# Patient Record
Sex: Male | Born: 1950
Health system: Southern US, Community
[De-identification: ages and names within clinical notes are randomized; demographics above are authoritative.]

## PROBLEM LIST (undated history)

## (undated) DIAGNOSIS — I10 Essential (primary) hypertension: Secondary | ICD-10-CM

## (undated) DIAGNOSIS — E785 Hyperlipidemia, unspecified: Secondary | ICD-10-CM

## (undated) DIAGNOSIS — K635 Polyp of colon: Secondary | ICD-10-CM

## (undated) DIAGNOSIS — I2510 Atherosclerotic heart disease of native coronary artery without angina pectoris: Secondary | ICD-10-CM

## (undated) DIAGNOSIS — N529 Male erectile dysfunction, unspecified: Secondary | ICD-10-CM

## (undated) DIAGNOSIS — G4733 Obstructive sleep apnea (adult) (pediatric): Secondary | ICD-10-CM

## (undated) DIAGNOSIS — K219 Gastro-esophageal reflux disease without esophagitis: Secondary | ICD-10-CM

## (undated) HISTORY — DX: Gastro-esophageal reflux disease without esophagitis: K21.9

## (undated) HISTORY — DX: Essential (primary) hypertension: I10

## (undated) HISTORY — DX: Male erectile dysfunction, unspecified: N52.9

## (undated) HISTORY — DX: Obstructive sleep apnea (adult) (pediatric): G47.33

## (undated) HISTORY — PX: CARDIAC CATHETERIZATION: SHX172

## (undated) HISTORY — DX: Hyperlipidemia, unspecified: E78.5

## (undated) HISTORY — DX: Polyp of colon: K63.5

---

## 1976-01-03 HISTORY — PX: OTHER SURGICAL HISTORY: SHX169

## 1996-12-05 ENCOUNTER — Encounter: Payer: Self-pay | Admitting: Internal Medicine

## 2005-10-04 ENCOUNTER — Ambulatory Visit: Payer: Self-pay | Admitting: Internal Medicine

## 2006-02-07 ENCOUNTER — Ambulatory Visit: Payer: Self-pay | Admitting: Internal Medicine

## 2006-02-16 ENCOUNTER — Encounter (INDEPENDENT_AMBULATORY_CARE_PROVIDER_SITE_OTHER): Payer: Self-pay | Admitting: *Deleted

## 2006-02-16 ENCOUNTER — Ambulatory Visit: Payer: Self-pay | Admitting: Internal Medicine

## 2006-09-12 DIAGNOSIS — I10 Essential (primary) hypertension: Secondary | ICD-10-CM | POA: Insufficient documentation

## 2006-09-12 DIAGNOSIS — K219 Gastro-esophageal reflux disease without esophagitis: Secondary | ICD-10-CM | POA: Insufficient documentation

## 2006-09-27 ENCOUNTER — Telehealth (INDEPENDENT_AMBULATORY_CARE_PROVIDER_SITE_OTHER): Payer: Self-pay | Admitting: *Deleted

## 2006-09-27 ENCOUNTER — Telehealth: Payer: Self-pay | Admitting: Internal Medicine

## 2006-09-27 ENCOUNTER — Ambulatory Visit: Payer: Self-pay | Admitting: Internal Medicine

## 2006-09-27 DIAGNOSIS — G479 Sleep disorder, unspecified: Secondary | ICD-10-CM | POA: Insufficient documentation

## 2006-09-28 ENCOUNTER — Encounter (INDEPENDENT_AMBULATORY_CARE_PROVIDER_SITE_OTHER): Payer: Self-pay | Admitting: *Deleted

## 2006-09-28 LAB — CONVERTED CEMR LAB
Calcium: 9 mg/dL (ref 8.4–10.5)
GFR calc Af Amer: 113 mL/min
Glucose, Bld: 86 mg/dL (ref 70–99)
Phosphorus: 3.1 mg/dL (ref 2.3–4.6)
Potassium: 3.9 meq/L (ref 3.5–5.1)
Sodium: 138 meq/L (ref 135–145)

## 2006-10-16 ENCOUNTER — Ambulatory Visit: Payer: Self-pay | Admitting: Pulmonary Disease

## 2006-10-18 ENCOUNTER — Encounter: Payer: Self-pay | Admitting: Pulmonary Disease

## 2006-10-18 ENCOUNTER — Ambulatory Visit (HOSPITAL_BASED_OUTPATIENT_CLINIC_OR_DEPARTMENT_OTHER): Admission: RE | Admit: 2006-10-18 | Discharge: 2006-10-18 | Payer: Self-pay | Admitting: Pulmonary Disease

## 2006-10-23 ENCOUNTER — Ambulatory Visit: Payer: Self-pay | Admitting: Pulmonary Disease

## 2007-01-23 ENCOUNTER — Ambulatory Visit: Payer: Self-pay | Admitting: Internal Medicine

## 2007-05-03 ENCOUNTER — Ambulatory Visit: Payer: Self-pay | Admitting: Internal Medicine

## 2007-05-06 LAB — CONVERTED CEMR LAB
Alkaline Phosphatase: 51 units/L (ref 39–117)
BUN: 10 mg/dL (ref 6–23)
Basophils Absolute: 0 10*3/uL (ref 0.0–0.1)
Bilirubin, Direct: 0.1 mg/dL (ref 0.0–0.3)
CO2: 30 meq/L (ref 19–32)
Chloride: 100 meq/L (ref 96–112)
Creatinine, Ser: 1 mg/dL (ref 0.4–1.5)
Eosinophils Absolute: 0.1 10*3/uL (ref 0.0–0.7)
Lipase: 31 units/L (ref 11.0–59.0)
MCHC: 34.9 g/dL (ref 30.0–36.0)
MCV: 87.5 fL (ref 78.0–100.0)
Neutrophils Relative %: 59.2 % (ref 43.0–77.0)
Platelets: 258 10*3/uL (ref 150–400)
RDW: 11.8 % (ref 11.5–14.6)
Total Bilirubin: 1.8 mg/dL — ABNORMAL HIGH (ref 0.3–1.2)
WBC: 5.4 10*3/uL (ref 4.5–10.5)

## 2007-09-06 ENCOUNTER — Ambulatory Visit: Payer: Self-pay | Admitting: Pulmonary Disease

## 2007-09-06 DIAGNOSIS — G4733 Obstructive sleep apnea (adult) (pediatric): Secondary | ICD-10-CM | POA: Insufficient documentation

## 2007-12-24 ENCOUNTER — Ambulatory Visit: Payer: Self-pay | Admitting: Internal Medicine

## 2007-12-25 LAB — CONVERTED CEMR LAB
AST: 27 units/L (ref 0–37)
Albumin: 3.9 g/dL (ref 3.5–5.2)
Basophils Absolute: 0 10*3/uL (ref 0.0–0.1)
CO2: 29 meq/L (ref 19–32)
Calcium: 8.8 mg/dL (ref 8.4–10.5)
Chloride: 105 meq/L (ref 96–112)
Cholesterol: 166 mg/dL (ref 0–200)
Eosinophils Absolute: 0.1 10*3/uL (ref 0.0–0.7)
GFR calc non Af Amer: 93 mL/min
Glucose, Bld: 115 mg/dL — ABNORMAL HIGH (ref 70–99)
Hemoglobin: 13.5 g/dL (ref 13.0–17.0)
LDL Cholesterol: 123 mg/dL — ABNORMAL HIGH (ref 0–99)
Lymphocytes Relative: 33.2 % (ref 12.0–46.0)
MCHC: 34.8 g/dL (ref 30.0–36.0)
MCV: 87.8 fL (ref 78.0–100.0)
Neutro Abs: 3 10*3/uL (ref 1.4–7.7)
Neutrophils Relative %: 55.1 % (ref 43.0–77.0)
PSA: 0.52 ng/mL (ref 0.10–4.00)
Potassium: 4.1 meq/L (ref 3.5–5.1)
RDW: 11.4 % — ABNORMAL LOW (ref 11.5–14.6)
Sodium: 139 meq/L (ref 135–145)
TSH: 1.85 microintl units/mL (ref 0.35–5.50)
Total Bilirubin: 1 mg/dL (ref 0.3–1.2)
Triglycerides: 74 mg/dL (ref 0–149)
VLDL: 15 mg/dL (ref 0–40)

## 2008-01-21 ENCOUNTER — Telehealth (INDEPENDENT_AMBULATORY_CARE_PROVIDER_SITE_OTHER): Payer: Self-pay | Admitting: *Deleted

## 2008-08-17 ENCOUNTER — Telehealth: Payer: Self-pay | Admitting: Internal Medicine

## 2008-09-14 ENCOUNTER — Ambulatory Visit: Payer: Self-pay | Admitting: Internal Medicine

## 2008-09-15 LAB — CONVERTED CEMR LAB
Chloride: 104 meq/L (ref 96–112)
Glucose, Bld: 93 mg/dL (ref 70–99)
Phosphorus: 3.8 mg/dL (ref 2.3–4.6)
Potassium: 4.5 meq/L (ref 3.5–5.1)
Sodium: 141 meq/L (ref 135–145)

## 2008-12-09 ENCOUNTER — Ambulatory Visit: Payer: Self-pay | Admitting: Internal Medicine

## 2009-01-14 ENCOUNTER — Ambulatory Visit: Payer: Self-pay | Admitting: Internal Medicine

## 2009-01-18 LAB — CONVERTED CEMR LAB
Albumin: 4.2 g/dL (ref 3.5–5.2)
Alkaline Phosphatase: 53 units/L (ref 39–117)
Basophils Relative: 0.3 % (ref 0.0–3.0)
Bilirubin, Direct: 0 mg/dL (ref 0.0–0.3)
Chloride: 99 meq/L (ref 96–112)
Eosinophils Absolute: 0 10*3/uL (ref 0.0–0.7)
GFR calc non Af Amer: 92.63 mL/min (ref >60)
Glucose, Bld: 101 mg/dL — ABNORMAL HIGH (ref 70–99)
HCT: 44.8 % (ref 39.0–52.0)
Hemoglobin: 14.7 g/dL (ref 13.0–17.0)
Lymphocytes Relative: 22.3 % (ref 12.0–46.0)
Lymphs Abs: 1.4 10*3/uL (ref 0.7–4.0)
MCHC: 32.8 g/dL (ref 30.0–36.0)
Monocytes Relative: 7.3 % (ref 3.0–12.0)
Neutro Abs: 4.3 10*3/uL (ref 1.4–7.7)
PSA: 0.6 ng/mL (ref 0.10–4.00)
Phosphorus: 3.8 mg/dL (ref 2.3–4.6)
Potassium: 4.2 meq/L (ref 3.5–5.1)
RBC: 4.89 M/uL (ref 4.22–5.81)
RDW: 11.6 % (ref 11.5–14.6)
Sodium: 135 meq/L (ref 135–145)
Total Protein: 7.3 g/dL (ref 6.0–8.3)

## 2009-01-21 ENCOUNTER — Ambulatory Visit: Payer: Self-pay | Admitting: Family Medicine

## 2009-01-27 ENCOUNTER — Encounter: Payer: Self-pay | Admitting: Family Medicine

## 2009-03-04 ENCOUNTER — Encounter: Payer: Self-pay | Admitting: Family Medicine

## 2009-03-24 ENCOUNTER — Ambulatory Visit: Payer: Self-pay | Admitting: Family Medicine

## 2009-06-28 ENCOUNTER — Ambulatory Visit: Payer: Self-pay | Admitting: Family Medicine

## 2009-08-31 ENCOUNTER — Ambulatory Visit: Payer: Self-pay | Admitting: Internal Medicine

## 2009-08-31 DIAGNOSIS — R0982 Postnasal drip: Secondary | ICD-10-CM | POA: Insufficient documentation

## 2010-02-01 NOTE — Miscellaneous (Signed)
Summary: [facility]  [facility]    Imported By: Lanelle Bal 05/05/2009 11:52:28  _____________________________________________________________________  External Attachment:    Type:   Image     Comment:   External Document

## 2010-02-01 NOTE — Assessment & Plan Note (Signed)
Summary: stomach pains/alc   Vital Signs:  Patient profile:   60 year old male Weight:      190 pounds Temp:     98.2 degrees F oral Pulse rate:   68 / minute Pulse rhythm:   regular BP sitting:   132 / 80  (left arm) Cuff size:   regular  Vitals Entered By: Sydell Axon LPN (June 28, 2009 12:38 PM) CC: Stomach pain, low back pain in the joints when stretching that has been going on for a long time   History of Present Illness: Pt has lower pelvic pain in the suprapubic  area which began after rowing a boat at [employer]. He began having pain a week ago in his lower back into the inguinal area. He had started exercising a few days before doing wistline stretching and lunges The discomforrt he is experiencing is not debillitating and comes and goes. 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 He has had back pain before where he has been sent to chiropracter.   Problems Prior to Update: 1)  Other&unspecified Diseases The Oral Soft Tissues  (ICD-528.9) 2)  Rotator Cuff Syndrome  (ICD-726.10) 3)  Special Screening Malignant Neoplasm of Prostate  (ICD-V76.44) 4)  Obstructive Sleep Apnea  (ICD-327.23) 5)  Abdominal Pain, Suprapubic  (ICD-789.09) 6)  Sleep Disorder  (ICD-780.50) 7)  Preventive Health Care  (ICD-V70.0) 8)  Hypertension  (ICD-401.9) 9)  Gerd  (ICD-530.81) 10)  Colonic Polyps, Hx of  (ICD-V12.72)  Medications Prior to Update: 1)  Amlodipine Besylate 5 Mg Tabs (Amlodipine Besylate) .... Take 1 By Mouth Once Daily  Allergies: 1)  ! Aspirin 2)  Lisinopril-Hydrochlorothiazide (Lisinopril-Hydrochlorothiazide)  Physical Exam  General:  Well-developed,well-nourished,in no acute distress; alert,appropriate and cooperative throughout examination Head:  Normocephalic and atraumatic without obvious abnormalities. No apparent alopecia or balding. Sinuses NT. Eyes:  No corneal or conjunctival inflammation noted. EOMI. Perrla. Funduscopic exam  benign, without hemorrhages, exudates or papilledema. Vision grossly normal. Ears:  R ear normal and L ear normal.   Nose:  External nasal examination shows no deformity or inflammation. Nasal mucosa are pink and moist without lesions or exudates. Mouth:  no erythema, no exudates, and no lesions.   Neck:  supple, no masses, no thyromegaly, no carotid bruits, and no cervical lymphadenopathy.   Lungs:  normal respiratory effort and normal breath sounds.   Heart:  normal rate, regular rhythm, no murmur, and no gallop.   Abdomen:  Bowel sounds positive,abdomen soft and non-tender without masses, organomegaly or hernias noted. Msk:  STomach are NT to palpation, able to do leglift w/o diff or discomfort. Back NT to palpation, ROM reasonably nml. SLR nml.   Impression & Recommendations:  Problem # 1:  MUSCLE STRAIN, ABDOMINAL WALL (ICD-848.8) Assessment New From rowing. Discussed approach. Discussed strateching. Pt not bothered terribly and doesn't want to take medication...discussed heat and ice and avoid prolonged position of sitting or standing...descrided moving if only of the feet while standing, etc.  Problem # 2:  BACK STRAIN, LUMBAR (ICD-847.2) Assessment: New Discussed exercises to do after warmiong up, encouraged stretching. Again heat and ice discussed. Domnot think his problem warrants seeing chropracter.  Complete Medication List: 1)  Amlodipine Besylate 5 Mg Tabs (Amlodipine besylate) .... Take 1 by mouth once daily  Patient Instructions: 1)  RTC if sxs do not resolve.  Current Allergies (reviewed today): ! ASPIRIN LISINOPRIL-HYDROCHLOROTHIAZIDE (LISINOPRIL-HYDROCHLOROTHIAZIDE)

## 2010-02-01 NOTE — Assessment & Plan Note (Signed)
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 Aspirin 2)  Lisinopril-Hydrochlorothiazide (Lisinopril-Hydrochlorothiazide)  Past History:  Past medical, surgical, family and social histories (including risk factors) reviewed for relevance to current acute and chronic problems.  Past Medical History: Reviewed history from 12/24/2007 and no changes required. Colonic polyps, hx of GERD Hypertension Obstructive sleep apnea  Past Surgical History: Reviewed history from 09/12/2006 and no changes required. Hepatitis (prob A) 1978 Poisoning (Asa toxicity), child  Family History: Reviewed history from 09/12/2006 and no changes required. 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 Eyes:  Denies double vision and vision loss-1 eye. ENT:  Denies decreased hearing and ringing in ears; teeth okay--regular with dentist. CV:  Denies chest pain or discomfort, difficulty breathing at night, difficulty breathing while lying down, fainting, lightheadness, near fainting, palpitations, and  shortness of breath with exertion. Resp:  Denies cough and shortness of breath. GI:  Complains of gas and indigestion; denies abdominal pain, bloody stools, change in bowel habits, dark tarry stools, nausea, and vomiting; has used rolaids in past--stopped though Seems that it was simethicone that helped. GU:  Denies erectile dysfunction, nocturia, urinary frequency, and urinary hesitancy. MS:  See HPI; Complains of joint pain and muscle aches; denies joint swelling; some pain in right knee also. Derm:  Denies lesion(s) and rash. Neuro:  Denies headaches, numbness, tremors, and weakness. Psych:  Complains of anxiety; denies depression; occ gets anxious about health but reassured with visits here. Heme:  Denies abnormal bruising and enlarge lymph nodes. Allergy:  Complains of seasonal allergies; denies sneezing; no meds.  Physical Exam  General:  alert and normal appearance.   Eyes:  pupils equal, pupils round, pupils reactive to light, and no optic disk abnormalities.   Ears:  R ear normal and L ear normal.   Mouth:  no erythema, no exudates, and no lesions.   Neck:  supple, no masses, no thyromegaly, no carotid bruits, and no cervical lymphadenopathy.   Lungs:  normal respiratory effort and normal breath sounds.   Heart:  normal rate, regular rhythm, no murmur, and no gallop.   Abdomen:  soft, non-tender, and no masses.   Rectal:  no hemorrhoids and no masses.   Prostate:  no nodules.   ?mild enlargement Msk:  no joint tenderness and no joint  Moderate reduction in abduction, internal and external rotation of right shoulder No ligament or meniscus findings in right knee Pulses:  1+ in feet Extremities:  no edema Neurologic:  alert & oriented X3, strength normal in all extremities, and gait normal.   Skin:  no rashes and no suspicious lesions.   Axillary Nodes:  No palpable lymphadenopathy Inguinal Nodes:  No significant adenopathy Psych:  normally interactive, good eye contact, not  anxious appearing, and not depressed appearing.     Impression & Recommendations:  Problem # 1:  PREVENTIVE HEALTH CARE (ICD-V70.0) Assessment Comment Only will check PSA discussed fitness Tdap  Problem # 2:  HYPERTENSION (ICD-401.9) Assessment: Comment Only  good control no changes needed  His updated medication list for this problem includes:    Amlodipine Besylate 5 Mg Tabs (Amlodipine besylate) .Marland Kitchen... Take 1 by mouth once daily  Orders: TLB-Renal Function Panel (80069-RENAL) TLB-Hepatic/Liver Function Pnl (80076-HEPATIC) TLB-TSH (Thyroid Stimulating Hormone) (84443-TSH) TLB-CBC Platelet - w/Differential (85025-CBCD) Venipuncture (57846)  Problem # 3:  ROTATOR CUFF SYNDROME (ICD-726.10) Assessment: Comment Only clear impingement now will set up eval with Dr Patsy Lager  Complete Medication List: 1)  Amlodipine Besylate 5 Mg Tabs (Amlodipine besylate) .... Take 1 by mouth once daily  Other Orders: TLB-PSA (Prostate Specific Antigen) (84153-PSA) Tdap => 64yrs IM (96295) Admin 1st Vaccine (28413) Admin 1st Vaccine [facility]) [phone])  Patient Instructions: 1)  Please try simethicone for the stomach gas problems. 2)  Please stop sugarless gums and candies 3)  Try tylenol --2 tabs of 325 or 500mg  at bedtime and during day as needed to help shoulder pain 4)  Please schedule a follow-up appointment in 6 months .  5)  Please set up appt with Dr Patsy Lager to evaluate right shoulder  Current Allergies (reviewed today): ! ASPIRIN LISINOPRIL-HYDROCHLOROTHIAZIDE (LISINOPRIL-HYDROCHLOROTHIAZIDE)   Tetanus/Td Vaccine    Vaccine Type: Tdap    Site: left deltoid    Mfr: GlaxoSmithKline    Dose: 0.5 ml    Route: IM    Given by: Mervin Hack CMA (AAMA)    Exp. Date: 02/27/2011    Lot #: [id number]    VIS given: 11/20/06 version given January 14, 2009.

## 2010-02-01 NOTE — Assessment & Plan Note (Signed)
Summary: 6 MONTH FOLLOW UP/[facility]   Vital Signs:  Patient profile:   60 year old male Weight:      187 pounds Temp:     98.6 degrees F oral Pulse rate:   68 / minute Pulse rhythm:   regular BP sitting:   122 / 70  (left arm) Cuff size:   large  Vitals Entered By: Mervin Hack CMA Duncan Dull) (August 31, 2009 11:02 AM) CC: 6 month follow-up   History of Present Illness: shoulder did get better Still doing okay with this  Abd strain is also improved since last visit He changed shoes --he thinks this has helped  Having some chest trouble gets drainage and has to clear throat Has some drainage--not clearly PND Mostly in day No heartburn problems GOing on for 3 weeks Feels a little weak but not overtly sick No fever No cough had some sore throat also----saw doctor at urgent care but didn't take the prescribed amoxil DId try magic mouthwash briefly--no clear improvement Just got new puppy--not clear if it preceeded getting it  No chest pain No SOB--just bothered in his throat No headaches  Allergies: 1)  ! Aspirin 2)  Lisinopril-Hydrochlorothiazide (Lisinopril-Hydrochlorothiazide)  Past History:  Past medical, surgical, family and social histories (including risk factors) reviewed for relevance to current acute and chronic problems.  Past Medical History: Reviewed history from 12/24/2007 and no changes required. Colonic polyps, hx of GERD Hypertension Obstructive sleep apnea  Past Surgical History: Reviewed history from 09/12/2006 and no changes required. Hepatitis (prob A) 1978 Poisoning (Asa toxicity), child  Family History: Reviewed history from 09/12/2006 and no changes required. Mom with arthrits and had shunt for hydrocephalus Dad with HTN Mat GM had MI Mat aunt with DM No cancer  Social History: Reviewed history from 01/14/2009 and no changes required. 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   Head:  no maxillary or frontal tenderness Ears:  R ear normal and L ear normal.   Nose:  miid mostly pale mucosal inflammation Mouth:  no erythema and no exudates.   Neck:  supple, no masses, no thyromegaly, no carotid bruits, and no cervical lymphadenopathy.   Lungs:  normal respiratory effort, no intercostal retractions, no accessory muscle use, and normal breath sounds.   Heart:  normal rate, regular rhythm, no murmur, and no gallop.   Extremities:  no edema Psych:  normally interactive, good eye contact, not anxious appearing, and not depressed appearing.     Impression & Recommendations:  Problem # 1:  POSTNASAL DRIP (ICD-784.91) Assessment New doesn't sound infectious ?allergic  P: will try loratadine    consider taking the antibiotic if worsens  Problem # 2:  BACK STRAIN, LUMBAR (ICD-847.2) Assessment: Improved feels new shoes have helped as well  Problem # 3:  HYPERTENSION (ICD-401.9) Assessment: Unchanged good control no changes needed  His updated medication list for this problem includes:    Amlodipine Besylate 5 Mg Tabs (Amlodipine besylate) .Marland Kitchen... Take 1 by mouth once daily  BP today: 122/70 Prior BP: 132/80 (06/28/2009)  Labs Reviewed: K+: 4.2 (01/14/2009) Creat: : 0.9 (01/14/2009)   Chol: 166 (12/24/2007)   HDL: 27.9 (12/24/2007)   LDL: 123 (12/24/2007)   TG: 74 (12/24/2007)  Complete Medication List: 1)  Amlodipine Besylate 5 Mg Tabs (Amlodipine  besylate) .... Take 1 by mouth once daily  Patient Instructions: 1)  Please try over the counter loratadine 10mg , 1-2 tabs daily to see if that helps the drainage 2)  Please schedule a follow-up appointment in 6 months for physical  Current Allergies (reviewed today): ! ASPIRIN LISINOPRIL-HYDROCHLOROTHIAZIDE (LISINOPRIL-HYDROCHLOROTHIAZIDE)

## 2010-02-01 NOTE — Miscellaneous (Signed)
Summary: PT Status Report/[facility]  PT Status Report/[facility]   Imported By: Lanelle Bal 03/10/2009 09:24:09  _____________________________________________________________________  External Attachment:    Type:   Image     Comment:   External Document

## 2010-02-01 NOTE — Miscellaneous (Signed)
Summary: PT Initial Note/[facility]  PT Initial Note/[facility]   Imported By: Lanelle Bal 02/17/2009 12:59:12  _____________________________________________________________________  External Attachment:    Type:   Image     Comment:   External Document

## 2010-02-01 NOTE — Assessment & Plan Note (Signed)
Summary: 30 MIN APPT EVALUATE RIGHT SHOULDER PER DR LETVAK/[facility]   Vital Signs:  Patient profile:   60 year old male Height:      67 inches Weight:      191.8 pounds BMI:     30.15 Temp:     98.3 degrees F oral Pulse rate:   72 / minute Pulse rhythm:   regular BP sitting:   110 / 70  (left arm) Cuff size:   large  Vitals Entered By: Benny Lennert CMA Duncan Dull) (January 21, 2009 11:25 AM)  History of Present Illness: Chief complaint evaluate right shoulder pain per dr Alphonsus Sias  60 year old very pleasant male seen at the request of Dr. Alphonsus Sias for evaluation of bilateral shoulder pain, R > L.  Approx. 2 months had his arm against the wall, felt a jerk and pain in his right shoulder. Since then, has had some progressively worse R shoulder pain. Lateral shoulder pain, pair with abduction and flexion, pain with IROM.   No swelling or bruising. No history of fracture, dislocation, or surgery.  Slightly better on the R in the past few days.   No PT, HEP, no injections have been tried.   NT at the shoulder blade, no significant neck pain or radiculopathy  Allergies: 1)  ! Aspirin 2)  Lisinopril-Hydrochlorothiazide (Lisinopril-Hydrochlorothiazide)  Past History:  Past medical, surgical, family and social histories (including risk factors) reviewed, and no changes noted (except as noted below).  Past Medical History: Reviewed history from 12/24/2007 and no changes required. Colonic polyps, hx of GERD Hypertension Obstructive sleep apnea  Past Surgical History: Reviewed history from 09/12/2006 and no changes required. Hepatitis (prob A) 1978 Poisoning (Asa toxicity), child   Family History: Reviewed history from 09/12/2006 and no changes required. Mom with arthrits and had shunt for hydrocephalus Dad with HTN Mat GM had MI Mat aunt with DM No cancer  Social History: Reviewed history from 01/14/2009 and no changes required. 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 Otherwise the pertinent positives of the ROS are noted above.    Physical Exam  General:  GEN: Well-developed,well-nourished,in no acute distress; alert,appropriate and cooperative throughout examination HEENT: Normocephalic and atraumatic without obvious abnormalities. No apparent alopecia or balding. Ears, externally no deformities PULM: Breathing comfortably in no respiratory distress EXT: No clubbing, cyanosis, or edema PSYCH: Normally interactive. Cooperative during the interview. Pleasant. Friendly and conversant. Not anxious or depressed appearing. Normal, full affect.  Msk:  Shoulder: Right and Left Inspection: No muscle wasting or winging Ecchymosis/edema: neg  AC joint, scapula, clavicle: NT Cervical spine: NT, full ROM Spurling's: neg Abduction: full, 5/5, some pain with motion Flexion: full, 5/5 IR, full, lift-off: 5/5 ER at neutral: full, 5/5 AC crossover: neg Neer: pos Hawkins: pos Drop Test: neg Empty Can: pos Supraspinatus insertion: mild-mod T Bicipital groove: NT Speed's: pos Yergason's: neg Sulcus sign: neg Scapular dyskinesis: mild scapular dyskinesis C5-T1 intact  Neuro: Sensation intact Grip 5/5    Impression & Recommendations:  Problem # 1:  ROTATOR CUFF SYNDROME (ICD-726.10) XR, shoulder, R Indication: pain Findings: No fracture. No dislocation. Impingement anatomy includes Type III acromium and small distal clavicular spur inferiorly. Moderate AC  joint arthritis. No GH OA.  R and L, R > L impingement pathology, RTC tendinopathy  Shoulder anatomy was reviewed with the patient using and  anatomical model.   Rotator cuff strengthening and scapular stabilization exercises were reviewed with the patient.  A handout was given based on a shoulder program from Dr. Graciella Freer of [facility] and the [facility].  Retraining shoulder mechanics and function was emphasized to the patient with rehab done at least 5-6 days a week.  The patient could benefit from formal PT to assist with scapular stabilization and RTC strengthening, and referral was made  I recommended a R SubAC injection given current pain level, but patient prefers to proceed with PT, HEP, readdress progress prior to injection. ASA allergy  cc: Dr. Alphonsus Sias  Orders: Radiology other (Radiology Other) Physical Therapy Referral (PT)  Complete Medication List: 1)  Amlodipine Besylate 5 Mg Tabs (Amlodipine besylate) .... Take 1 by mouth once daily  Patient Instructions: 1)  Referral Appointment Information 2)  Day/Date: 3)  Time: 4)  Place/MD: 5)  Address: 6)  Phone/Fax: 7)  Patient given appointment information. Information/Orders faxed/mailed.  8)  f/u 6 weeks  Current Allergies (reviewed today): ! ASPIRIN LISINOPRIL-HYDROCHLOROTHIAZIDE (LISINOPRIL-HYDROCHLOROTHIAZIDE)

## 2010-02-01 NOTE — Assessment & Plan Note (Signed)
Summary: 6 WK F/U DLO   Vital Signs:  Patient profile:   60 year old male Height:      67 inches Weight:      190.2 pounds BMI:     29.90 Temp:     98.3 degrees F oral Pulse rate:   72 / minute Pulse rhythm:   regular BP sitting:   114 / 72  (left arm) Cuff size:   regular  Vitals Entered By: Benny Lennert CMA Duncan Dull) (March 24, 2009 10:38 AM)  History of Present Illness: Chief complaint 6 week follow up  60 yo dramatically better RTC, R still a little symptomatic compared to L  ROM better, minimal to no impingement, has been using pully, going to PT, compliant with HEP  REVIEW OF SYSTEMS  GEN: No systemic complaints, no fevers, chills, sweats, or other acute illnesses MSK: Detailed in the HPI GI: tolerating PO intake without difficulty Neuro: No numbness, parasthesias, or tingling associated. Otherwise the pertinent positives of the ROS are noted above.    GEN: Well-developed,well-nourished,in no acute distress; alert,appropriate and cooperative throughout examination HEENT: Normocephalic and atraumatic without obvious abnormalities. No apparent alopecia or balding. Ears, externally no deformities PULM: Breathing comfortably in no respiratory distress EXT: No clubbing, cyanosis, or edema PSYCH: Normally interactive. Cooperative during the interview. Pleasant. Friendly and conversant. Not anxious or depressed appearing. Normal, full affect.   Shoulder: B Inspection: No muscle wasting or winging Ecchymosis/edema: neg  AC joint, scapula, clavicle: NT Cervical spine: NT, full ROM Spurling's: neg Abduction: full, 5/5 Flexion: full, 5/5 IR, full, lift-off: 5/5 ER at neutral: full, 5/5 AC crossover and compression: neg Neer: neg Hawkins: neg Drop Test: neg Empty Can: neg Supraspinatus insertion: NT Bicipital groove: NT Speed's: neg Yergason's: neg Sulcus sign: neg Scapular dyskinesis: none C5-T1 intact Sensation intact Grip 5/5   Allergies: 1)  ! Aspirin 2)   Lisinopril-Hydrochlorothiazide (Lisinopril-Hydrochlorothiazide)  Past History:  Past medical, surgical, family and social histories (including risk factors) reviewed, and no changes noted (except as noted below).  Past Medical History: Reviewed history from 12/24/2007 and no changes required. Colonic polyps, hx of GERD Hypertension Obstructive sleep apnea  Past Surgical History: Reviewed history from 09/12/2006 and no changes required. Hepatitis (prob A) 1978 Poisoning (Asa toxicity), child  Family History: Reviewed history from 09/12/2006 and no changes required. Mom with arthrits and had shunt for hydrocephalus Dad with HTN Mat GM had MI Mat aunt with DM No cancer  Social History: Reviewed history from 01/14/2009 and no changes required. 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 Take 1 by mouth once daily  Current Allergies (reviewed today): ! ASPIRIN LISINOPRIL-HYDROCHLOROTHIAZIDE (LISINOPRIL-HYDROCHLOROTHIAZIDE)

## 2010-02-09 ENCOUNTER — Encounter (INDEPENDENT_AMBULATORY_CARE_PROVIDER_SITE_OTHER): Payer: BC Managed Care – PPO | Admitting: Internal Medicine

## 2010-02-09 ENCOUNTER — Other Ambulatory Visit: Payer: Self-pay | Admitting: Internal Medicine

## 2010-02-09 ENCOUNTER — Encounter: Payer: Self-pay | Admitting: Internal Medicine

## 2010-02-09 ENCOUNTER — Telehealth (INDEPENDENT_AMBULATORY_CARE_PROVIDER_SITE_OTHER): Payer: Self-pay | Admitting: *Deleted

## 2010-02-09 DIAGNOSIS — Z125 Encounter for screening for malignant neoplasm of prostate: Secondary | ICD-10-CM

## 2010-02-09 DIAGNOSIS — Z0000 Encounter for general adult medical examination without abnormal findings: Secondary | ICD-10-CM

## 2010-02-09 DIAGNOSIS — I10 Essential (primary) hypertension: Secondary | ICD-10-CM

## 2010-02-09 LAB — RENAL FUNCTION PANEL
Albumin: 4.3 g/dL (ref 3.5–5.2)
BUN: 13 mg/dL (ref 6–23)
CO2: 30 mEq/L (ref 19–32)
Chloride: 102 mEq/L (ref 96–112)
Creatinine, Ser: 0.8 mg/dL (ref 0.4–1.5)

## 2010-02-09 LAB — HEPATIC FUNCTION PANEL
ALT: 33 U/L (ref 0–53)
Albumin: 4.3 g/dL (ref 3.5–5.2)
Alkaline Phosphatase: 68 U/L (ref 39–117)
Bilirubin, Direct: 0.2 mg/dL (ref 0.0–0.3)
Total Protein: 7 g/dL (ref 6.0–8.3)

## 2010-02-09 LAB — CBC WITH DIFFERENTIAL/PLATELET
Basophils Absolute: 0 10*3/uL (ref 0.0–0.1)
Eosinophils Absolute: 0.1 10*3/uL (ref 0.0–0.7)
HCT: 43.3 % (ref 39.0–52.0)
Hemoglobin: 14.9 g/dL (ref 13.0–17.0)
Lymphs Abs: 1.5 10*3/uL (ref 0.7–4.0)
MCHC: 34.4 g/dL (ref 30.0–36.0)
Monocytes Absolute: 0.5 10*3/uL (ref 0.1–1.0)
Neutro Abs: 3.7 10*3/uL (ref 1.4–7.7)
Platelets: 245 10*3/uL (ref 150.0–400.0)
RDW: 12.6 % (ref 11.5–14.6)

## 2010-02-17 NOTE — Assessment & Plan Note (Signed)
Summary: CPE/JRR   Vital Signs:  Patient profile:   60 year old male Height:      70 inches Weight:      191 pounds BMI:     27.50 Temp:     98.0 degrees F oral Pulse rate:   78 / minute Pulse rhythm:   regular BP sitting:   146 / 76  (left arm) Cuff size:   large  Vitals Entered By: Mervin Hack CMA Duncan Dull) (February 09, 2010 9:38 AM) CC: adult physical   History of Present Illness: Doing okay  Notes poisoning from "[employer]bean" in 4th grade hospitalized Urine was dark and bloody apparently Took aspirin then and was told he was sensitive to it discussed that this is uncertain---would not use as primary prevention, only secondary  Gets itchiness in between left 4th and 5th toes Uses OTC spray and that helps Uses powder now--that helps Discussed just sticking with this  More problems with snoring Having daytime somnolence will pursue the sleep apnea Rx  Gets bilateral low back pain Worse when sitting---esp noticeable after just getting up Better with activity Discussed proper posture and exercise  Gets sharp pain in left trapezius when having stress Will try topical and that helps  Occ forgets things Trouble with some word recall No trouble with minutiae at [employer] will have quick sense of "where am I" in car---?idstracted  Allergies: 1)  Aspirin 2)  Lisinopril-Hydrochlorothiazide (Lisinopril-Hydrochlorothiazide)  Past History:  Past medical, surgical, family and social histories (including risk factors) reviewed for relevance to current acute and chronic problems.  Past Medical History: Reviewed history from 12/24/2007 and no changes required. Colonic polyps, hx of GERD Hypertension Obstructive sleep apnea  Past Surgical History: Reviewed history from 09/12/2006 and no changes required. Hepatitis (prob A) 1978 Poisoning (Asa toxicity), child  Family History: Reviewed history from 09/12/2006 and no changes required. Mom with  arthrits and had shunt for hydrocephalus Dad with HTN Mat GM had MI Mat aunt with DM No cancer  Social History: Reviewed history from 01/14/2009 and no changes required. 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 Eyes:  Denies double vision and vision loss-1 eye. ENT:  Denies decreased hearing and ringing in ears; teeth okay--regular with dentist. CV:  Denies chest pain or discomfort, difficulty breathing at night, difficulty breathing while lying down, fainting, lightheadness, palpitations, and shortness of breath with exertion. Resp:  Denies cough and shortness of breath. GI:  Complains of indigestion; denies abdominal pain, bloody stools, change in bowel habits, dark tarry stools, nausea, and vomiting; rare heartburn. GU:  Denies erectile dysfunction, nocturia, urinary frequency, and urinary hesitancy. MS:  Complains of joint pain and low back pain; denies joint swelling; neck and shoulder pain. Derm:  Denies lesion(s) and rash. Neuro:  Denies headaches, numbness, tingling, and weakness. Psych:  Complains of anxiety; denies depression; Lots of pressure at work No regluar depressed mood. Heme:  Denies abnormal bruising and enlarge lymph nodes. Allergy:  Denies sneezing.  Physical Exam  General:  alert and normal appearance.   Head:  normocephalic and atraumatic.   Eyes:  pupils equal, pupils round, pupils reactive to light, and no optic disk abnormalities.   Ears:  R ear normal and L ear normal.   Mouth:  no erythema,  no exudates, and no lesions.   Neck:  supple, no masses, no thyromegaly, no carotid bruits, and no cervical lymphadenopathy.   Lungs:  normal respiratory effort, no intercostal retractions, no accessory  muscle use, and normal breath sounds.   Heart:  normal rate, regular rhythm, no murmur, and no gallop.   Abdomen:  soft, non-tender, and no masses.   Msk:  no joint tenderness and no joint swelling.   Some tenderness in muscle areas in bilateral low back Pulses:  1+ in feet Extremities:  no edema Neurologic:  alert & oriented X3, strength normal in all extremities, and gait normal.   Skin:  no rashes and no suspicious lesions.   Multiple benign nevi Axillary Nodes:  No palpable lymphadenopathy Psych:  normally interactive, good eye contact, not anxious appearing, and not depressed appearing.     Impression & Recommendations:  Problem # 1:  PREVENTIVE HEALTH CARE (ICD-V70.0) Assessment Comment Only needs to work on stress reduction---exercise, get away from work, etc discussed PSA--will get since he isn't informed about this  Problem # 2:  SLEEP DISORDER (ICD-780.50) Assessment: Deteriorated worse Note sent to Dr clance to set up appt again  Problem # 3:  HYPERTENSION (ICD-401.9) Assessment: Deteriorated  more stress discussed lifestyle issues  His updated medication list for this problem includes:    Amlodipine Besylate 5 Mg Tabs (Amlodipine besylate) .Marland Kitchen... Take 1 by mouth once daily  BP today: 146/76 Prior BP: 122/70 (08/31/2009)  Labs Reviewed: K+: 4.2 (01/14/2009) Creat: : 0.9 (01/14/2009)   Chol: 166 (12/24/2007)   HDL: 27.9 (12/24/2007)   LDL: 123 (12/24/2007)   TG: 74 (12/24/2007)  Orders: Venipuncture (16109) TLB-Renal Function Panel (80069-RENAL) TLB-CBC Platelet - w/Differential (85025-CBCD) TLB-Hepatic/Liver Function Pnl (80076-HEPATIC) TLB-TSH (Thyroid Stimulating Hormone) (84443-TSH)  Complete Medication List: 1)  Amlodipine Besylate 5 Mg Tabs (Amlodipine besylate) .... Take 1 by mouth once daily  Other Orders: TLB-PSA (Prostate Specific Antigen) (84153-PSA)  Patient Instructions: 1)  Please schedule a follow-up appointment in 6 months .     Orders Added: 1)  Venipuncture [36415] 2)  TLB-Renal Function Panel [80069-RENAL] 3)  TLB-CBC Platelet - w/Differential [85025-CBCD] 4)  TLB-Hepatic/Liver Function Pnl [80076-HEPATIC] 5)  TLB-TSH (Thyroid Stimulating Hormone) [84443-TSH] 6)  TLB-PSA (Prostate Specific Antigen) [84153-PSA] 7)  Est. Patient 40-64 years [60454]    Current Allergies (reviewed today): ASPIRIN LISINOPRIL-HYDROCHLOROTHIAZIDE (LISINOPRIL-HYDROCHLOROTHIAZIDE)

## 2010-02-23 NOTE — Progress Notes (Signed)
Summary: need to sched ov with kc  ---- Converted from flag ---- ---- 02/09/2010 10:25 AM, Barbaraann Share MD wrote: Dakota Branch, please call this pt and arrange for an ov with me for treatment of osa per dr Karle Starch request. ------------------------------  called and spoke with pt.  informed him of the above information.  pt stated he was on the other line with someone else and would have to call me back.  gave pt our # to call back to schedule ov. Marland KitchenArman Filter LPN  February 09, 2010 2:28 PM   called and spoke with pt.  pt scheduled to see [facility] 03-16-2010 at 9:45am.  Arman Filter LPN  February 15, 2010 2:22 PM

## 2010-03-16 ENCOUNTER — Ambulatory Visit: Payer: BC Managed Care – PPO | Admitting: Pulmonary Disease

## 2010-03-23 ENCOUNTER — Encounter: Payer: Self-pay | Admitting: Pulmonary Disease

## 2010-03-24 ENCOUNTER — Encounter: Payer: Self-pay | Admitting: Pulmonary Disease

## 2010-03-24 ENCOUNTER — Ambulatory Visit (INDEPENDENT_AMBULATORY_CARE_PROVIDER_SITE_OTHER): Payer: BC Managed Care – PPO | Admitting: Pulmonary Disease

## 2010-03-24 VITALS — BP 110/60 | HR 96 | Temp 98.1°F | Ht 69.0 in | Wt 193.0 lb

## 2010-03-24 DIAGNOSIS — G4733 Obstructive sleep apnea (adult) (pediatric): Secondary | ICD-10-CM

## 2010-03-24 NOTE — Progress Notes (Signed)
  Subjective:    Patient ID: Dakota Branch, male    DOB: May 24, 1950, 60 y.o.   MRN: [id number]  HPI The pt comes in today for f/u of his known mild osa.  He has not been seen since 2009, but is continuing to have symptoms associated with his osa, including sleepiness with driving at times.  He has really forgotten his various options discussed last visit, but he has at least lost 5 pounds.     Review of Systems  Constitutional: Negative for fever, appetite change and unexpected weight change.  HENT: Negative for ear pain, congestion, sore throat, rhinorrhea, sneezing, trouble swallowing, dental problem and postnasal drip.   Eyes: Negative for redness.  Respiratory: Negative for cough, shortness of breath and wheezing.   Cardiovascular: Negative for chest pain, palpitations and leg swelling.  Gastrointestinal: Negative for nausea, abdominal pain and diarrhea.  Genitourinary: Negative for dysuria.  Musculoskeletal: Negative for joint swelling.  Skin: Positive for rash.  Neurological: Negative for syncope and headaches.  Hematological: Does not bruise/bleed easily.  Psychiatric/Behavioral: Negative for dysphoric mood. The patient is not nervous/anxious.        Objective:   Physical Exam  Constitutional: He is oriented to person, place, and time. He appears well-developed and well-nourished.       Mildly sleepy  HENT:       No purulence or discharge noted  Pulmonary/Chest: Breath sounds normal.  Musculoskeletal: He exhibits no edema.  Neurological: He is alert and oriented to person, place, and time.       Mildly sleepy ,moves all 4   Skin:       No cyanosis           Assessment & Plan:

## 2010-03-24 NOTE — Assessment & Plan Note (Signed)
The pt has know mild osa, but is having persistent symptoms since the last visit.  I have reviewed again treatment with surgery, dental appliance, and cpap.  I favor the last 2 initially, and he would tend to agree.  He has decided to try cpap first, and will see how he does with this.

## 2010-03-24 NOTE — Patient Instructions (Signed)
Will start on cpap as a trial.. Please call if having any issues with tolerance followup up with me in 5 weeks.

## 2010-05-17 NOTE — Procedures (Signed)
NAME:  Dakota Branch, UNREIN NO.:  [id number]   MEDICAL RECORD NO.:  [id number]          PATIENT TYPE:  OUT   LOCATION:  SLEEP CENTER                 FACILITY:  [facility]   PHYSICIAN:  Barbaraann Share, MD,FCCPDATE OF BIRTH:  [birth year] 02, 1950   DATE OF STUDY:  10/18/2006                            NOCTURNAL POLYSOMNOGRAM   REFERRING PHYSICIAN:   REFERRING PHYSICIAN:  Marcelyn Bruins, M.D.   INDICATION FOR STUDY:  Hypersomnia with sleep apnea.   EPWORTH SLEEPINESS SCORE:  8   SLEEP ARCHITECTURE:  The patient had a total sleep time of 345 minutes  with adequate slow-wave sleep but decreased REM.  Sleep onset latency  was prolonged at 80 minutes and REM latency was very prolonged as well  at 188 minutes.  Sleep efficiency was decreased at 80%.   RESPIRATORY DATA:  The patient was found to have 68 hypopneas and no  obstructive apneas for an apnea/hypopnea index of 12 events per hour.  The events were not positional but there was loud snoring noted  throughout.   OXYGEN DATA:  There was O2 desaturation as low as 88% with the patient's  obstructive events.   CARDIAC DATA:  No clinically significant arrhythmias were noted.   MOVEMENT/PARASOMNIA:  The patient was found to have only eight leg jerks  with no significant arousal or awakening.  However, he was noted to have  significant bruxism throughout the night.   IMPRESSION/RECOMMENDATION:  1. Mild obstructive sleep apnea/hypopnea syndrome with an      apnea/hypopnea index of 12 events per hour and oxygen desaturation      as low as 88%.  Treatment for this degree of sleep apnea can      include weight loss alone if      applicable, upper airway surgery, and also CPAP.  Clinical      correlation is suggested.  2. Bruxism was noted throughout the night.      Barbaraann Share, MD,FCCP  Diplomate, American Board of Sleep  Medicine  Electronically Signed     [facility]  D:  10/23/2006 08:01:45  T:  10/23/2006 15:29:38  Job:   [id number]

## 2010-05-17 NOTE — Assessment & Plan Note (Signed)
[facility]                             PULMONARY OFFICE NOTE   MOSES, ODOHERTY                     MRN:          [id number]  DATE:10/16/2006                            DOB:          09-20-1950    HISTORY OF PRESENT ILLNESS:  The patient is a 60 year old gentleman whom  I have been asked to see for possible obstructive sleep apnea.  The  patient states that he has been having significant snoring and also  fatigue in the morning for the last 5 to 6 years.  He does not feel that  it has been getting worse.  His wife has not noticed pauses in his  breathing during sleep.  The patient typically goes to bed at 12:00 and  gets up at 7:30 to start his day.  He does not feel totally rested, but  has a hard time expressing his thoughts because he states he has nothing  to compare it to.  The patient denies any choking arousals during the  night.  The patient works in [employer] and really does not  have time to have sleep pressure or alertness issues with periods of  inactivity.  The patient does, however, feel tired in the evening and  has significant sleep pressure if he tries to read or watch TV at that  time.  The patient denies any frank sleepiness with driving, but does  have occasional sleep pressure if driving distances, in that he is very  tired at the time.  He denies any chronic nasal congestion.  Of note,  his weight is up about 5 pounds over the last few years.   PAST MEDICAL HISTORY:  1. Significant for hypertension.  2. History of allergic rhinitis.   CURRENT MEDICATIONS:  Lisinopril 20/25 one-half daily.   The patient has intolerance to ASPIRIN.   SOCIAL HISTORY:  He is married and has 4 children.  He has a history of  smoking as a teenager.   FAMILY HISTORY:  Remarkable for his father having lung cancer and mother  having arthritis of some type.   REVIEW OF SYSTEMS:  As per the history of present illness.  Also see  patient intake form documented in the chart.   PHYSICAL EXAM:  GENERAL:  He is a mildly overweight male in no acute  distress.  Blood pressure 114/70, pulse 75, temperature 98.4, weight 202 pounds.  He is 5 feet 9 inches tall.  O2 saturation on room air is 97%.  HEENT:  Pupils are equal, round, and reactive to light and  accommodation.  Extraocular muscles are intact.  Nares show deviated  septum to the left.  Oropharynx with moderate elongation of the soft  palate and uvula.  NECK:  Supple without JVD or lymphadenopathy.  There is no palpable  thyromegaly.  CHEST:  Totally clear.  CARDIAC:  Reveals regular rate and rhythm.  No murmurs, rubs, or  gallops.  ABDOMEN:  Soft and nontender with good bowel sounds.  GENITAL, RECTAL, AND BREASTS:  Not done and not indicated.  LOWER EXTREMITIES:  Without edema.  Pulses are  intact distally.  NEUROLOGIC:  He is alert and oriented with no obvious observable motor  defects.   IMPRESSION:  Questionable obstructive sleep apnea.  The patient  certainly has some history that is suspicious for this, but other that  is not.  His wife has been complaining about his snoring and he is  concerned about that, and he also feels that he is not as rested as he  should be.  The patient and I discussed a possible trial period of  weight loss to see if things improve, but both of [place] have come to the  conclusion to go ahead and proceed with nocturnal polysomnography and  see if he indeed has sleep apnea.   PLAN:  1. Schedule for nocturnal polysomnogram.  2. Work on weight loss.  3. The patient will follow up after the above.     Barbaraann Share, MD,FCCP  Electronically Signed    [facility]  DD: 10/25/2006  DT: 10/26/2006  Job #: [id number]   cc:   Karie Schwalbe, MD

## 2010-06-06 ENCOUNTER — Telehealth: Payer: Self-pay | Admitting: Pulmonary Disease

## 2010-06-06 NOTE — Telephone Encounter (Signed)
[id number]

## 2010-06-06 NOTE — Telephone Encounter (Signed)
I am not aware of the "smart start", and he can discuss with his dme who can then call me if they have something like this.  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

## 2010-06-06 NOTE — Telephone Encounter (Signed)
I spoke with the patient and he states that he has been using his CPAP machine x 3 nights. 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 He states it is a lot of trouble to do that because when he takes off the mask he then has to turn the light on to see to turn the machine off.  He states this makes a lot of noise and disturbs his wife. He says he did some research and there is a machine that has [employer] which he states the machine automatically shuts off when he removes the mask so he does not need to turn the light on and disturb his wife.  Pt did make 5 week follow-up appt. Pt currently uses [facility].  Please advise.Carron Curie, CMA

## 2010-06-07 NOTE — Telephone Encounter (Signed)
Called [phone] to inform Dakota Branch of this but office closed - [facility] tomorrow.

## 2010-06-07 NOTE — Telephone Encounter (Signed)
Ok with me if the pt is willing to pay for it, or if the dme is going to give it to him for free.  I am not going to write any note or sign special paperwork saying this is medically necessary.

## 2010-06-07 NOTE — Telephone Encounter (Signed)
[facility] returning Crystal's call.  [phone]

## 2010-06-07 NOTE — Telephone Encounter (Signed)
Called, spoke with pt.  He was informed of KC's statement. Pt states the machine he is asking for is the "exact same model as the machine I have but it has more options on it."  He states this machine will automatically turn on when he puts it on and will turn off when if takes the mask off.  He states this what the smart start is and it is no different than the current machine other than having more options.    Called [facility], spoke with Victorino Dike.  Per Victorino Dike, she has spoken with pt too. States she does not know of a machine that has this on it.  Also states the machine pt is currently on has no other models available.  Victorino Dike states she will call Will who is the [facility] rep to discuss this with him.  Victorino Dike will call office back for update.  Will await Jennifer's call back.

## 2010-06-07 NOTE — Telephone Encounter (Signed)
Spoke with [employer].  States she spoke with Will.  They found a machine which is the s9 auto set.  States it looks exactly like the one pt has but it's brains are a little different and it's a little higher end.  Can still download from it.  Does have the smart start option.  Victorino Dike would like to know if ok with KC to order this s9 auto set machine for pt.  Pls advise.  Thanks!

## 2010-06-08 NOTE — Telephone Encounter (Signed)
Called, spoke with Victorino Dike.  She is aware of KC's statement.  States this will be free to pt.  Victorino Dike will inform pt he will be getting this machine.

## 2010-07-13 ENCOUNTER — Ambulatory Visit: Payer: BC Managed Care – PPO | Admitting: Pulmonary Disease

## 2010-08-10 ENCOUNTER — Ambulatory Visit (INDEPENDENT_AMBULATORY_CARE_PROVIDER_SITE_OTHER): Payer: BC Managed Care – PPO | Admitting: Internal Medicine

## 2010-08-10 ENCOUNTER — Encounter: Payer: Self-pay | Admitting: Internal Medicine

## 2010-08-10 VITALS — BP 138/80 | HR 80 | Temp 98.5°F | Ht 69.0 in | Wt 193.0 lb

## 2010-08-10 DIAGNOSIS — G4733 Obstructive sleep apnea (adult) (pediatric): Secondary | ICD-10-CM

## 2010-08-10 DIAGNOSIS — I10 Essential (primary) hypertension: Secondary | ICD-10-CM

## 2010-08-10 DIAGNOSIS — L309 Dermatitis, unspecified: Secondary | ICD-10-CM

## 2010-08-10 DIAGNOSIS — L259 Unspecified contact dermatitis, unspecified cause: Secondary | ICD-10-CM

## 2010-08-10 DIAGNOSIS — K219 Gastro-esophageal reflux disease without esophagitis: Secondary | ICD-10-CM

## 2010-08-10 MED ORDER — TRIAMCINOLONE ACETONIDE 0.1 % EX LOTN: TOPICAL_LOTION | Freq: Three times a day (TID) | CUTANEOUS | Status: DC | PRN

## 2010-08-10 NOTE — Progress Notes (Signed)
  Subjective:    Patient ID: Dakota Branch, male    DOB: 16-May-1950, 60 y.o.   MRN: [id number]  HPI Has seen Dr Shelle Iron Now on CPAP-10 with a ramp up feature Getting used to it--awakens with the machine off at times, dry mouth despite humidifier, etc Not sure if he is sleeping better  Has itchy area for a couple of months on upper chest Slight rash Worse with hot water Has tried some baking powder May be related to gold chain--no nickel that he knows of  No chest pain no SOB No regular exercise Occ brief edema if prolonged sitting--like in car  No stomach problems No heartburn Hasn't needed antacids lately  Current Outpatient Prescriptions on File Prior to Visit  Medication Sig Dispense Refill  . amLODipine (NORVASC) 5 MG tablet Take 5 mg by mouth daily.          Allergies  Allergen Reactions  . Aspirin     REACTION: toxic reaction as a child--but this is unclear  . Lisinopril-Hydrochlorothiazide     REACTION: itching    Past Medical History  Diagnosis Date  . Colon polyps   . GERD (gastroesophageal reflux disease)   . Hypertension   . OSA (obstructive sleep apnea)     Past Surgical History  Procedure Date  . Hepatitis ( prob a) 1978    Family History  Problem Relation Age of Onset  . Hydrocephalus Mother     shunt  . Arthritis Mother   . Hypertension Father   . Heart attack Maternal Grandmother   . Diabetes Maternal Aunt     History   Social History  . Marital Status: Married    Spouse Name: N/A    Number of Children: 4  . Years of Education: N/A   Occupational History  . owner     [employer]   Social History Main Topics  . Smoking status: Former Games developer  . Smokeless tobacco: Never Used  . Alcohol Use: No  . Drug Use: Not on file  . Sexually Active: Not on file   Other Topics Concern  . Not on file   Social History Narrative   Pt is a Theme park manager.   Review of Systems Appetite is okay Weight stable No sig mood  problems     Objective:   Physical Exam  Constitutional: He appears well-developed and well-nourished. No distress.  Neck: Normal range of motion. Neck supple. No thyromegaly present.  Cardiovascular: Normal rate, regular rhythm, normal heart sounds and intact distal pulses.  Exam reveals no gallop.   No murmur heard. Pulmonary/Chest: Effort normal and breath sounds normal. No respiratory distress. He has no wheezes. He has no rales.  Abdominal: Soft. There is no tenderness.  Musculoskeletal: Normal range of motion. He exhibits no edema and no tenderness.  Lymphadenopathy:    He has no cervical adenopathy.  Skin:       Erythematous rash in V distribution in upper chest  Psychiatric: He has a normal mood and affect. His behavior is normal. Judgment and thought content normal.          Assessment & Plan:

## 2010-08-10 NOTE — Assessment & Plan Note (Signed)
Working with Dr Shelle Iron

## 2010-08-10 NOTE — Assessment & Plan Note (Signed)
Good control No changes needed BP Readings from Last 3 Encounters:  08/10/10 138/80  03/24/10 110/60  02/09/10 146/76

## 2010-08-10 NOTE — Assessment & Plan Note (Signed)
Improved Hasn't needed meds of late

## 2010-08-10 NOTE — Assessment & Plan Note (Signed)
Non specific ?photosensitivity Will try triamcinolone lotion prn

## 2010-08-11 ENCOUNTER — Encounter: Payer: Self-pay | Admitting: Pulmonary Disease

## 2010-08-11 ENCOUNTER — Ambulatory Visit (INDEPENDENT_AMBULATORY_CARE_PROVIDER_SITE_OTHER): Payer: BC Managed Care – PPO | Admitting: Pulmonary Disease

## 2010-08-11 VITALS — BP 120/68 | HR 67 | Temp 97.9°F | Ht 69.0 in | Wt 195.0 lb

## 2010-08-11 DIAGNOSIS — G4733 Obstructive sleep apnea (adult) (pediatric): Secondary | ICD-10-CM

## 2010-08-11 NOTE — Patient Instructions (Signed)
Will put machine on auto mode to optimize your pressure.  Will call you with results when I receive your download. Increase heat on humidifier, but if mouth dryness persists, may have to consider a full face mask or add chin strap to your nasal pillows. Work on modest weight loss.  followup with me in 6mos.

## 2010-08-11 NOTE — Progress Notes (Signed)
  Subjective:    Patient ID: Dakota Branch, male    DOB: 06-19-1950, 60 y.o.   MRN: [id number]  HPI The patient comes in today for followup of his known mild instructed sleep apnea.  He was last seen in March of this year and started on CPAP, however did not followup at 5 weeks as instructed.  He has been wearing CPAP since that time, but is having difficulties with dry mouth.  He does use nasal pillows, but is unsure if he is keeping his mouth closed.  He is also not completely comfortable with adjusting his heated humidification.  The patient has never had his CPAP pressure optimized since starting therapy.  Download from his machine today shows no significant air leak, and fairly good control of his AHI at the current setting of 10 cm of water.  However, his compliance has been fairly poor, with only 17% of the used days spent at or greater than 4 hrs.  He used the device only 27/52 days from the middle of June until 8/8.     Review of Systems  Constitutional: Negative for fever and unexpected weight change.  HENT: Negative for ear pain, nosebleeds, congestion, sore throat, rhinorrhea, sneezing, trouble swallowing, dental problem, postnasal drip and sinus pressure.   Eyes: Negative for redness and itching.  Respiratory: Negative for cough, chest tightness, shortness of breath and wheezing.   Cardiovascular: Negative for palpitations and leg swelling.  Gastrointestinal: Negative for nausea and vomiting.  Genitourinary: Negative for dysuria.  Musculoskeletal: Negative for joint swelling.  Skin: Positive for rash.  Neurological: Negative for headaches.  Hematological: Does not bruise/bleed easily.  Psychiatric/Behavioral: Negative for dysphoric mood. The patient is not nervous/anxious.        Objective:   Physical Exam Wd male in nad Nares without discharge or purulence, no skin breakdown or pressure necrosis from cpap mask Cor with rrr LE without edema, no cyanosis Alert, oriented,  moves all 4        Assessment & Plan:

## 2010-08-14 ENCOUNTER — Encounter: Payer: Self-pay | Admitting: Pulmonary Disease

## 2010-08-14 NOTE — Assessment & Plan Note (Signed)
The patient has mild obstructive sleep apnea by his past sleep study, but is having issues with CPAP tolerance at this time.  It is unclear whether his mouth dryness is due to mouth opening, or whether he needs to adjust his heated humidification.  He also has not had his pressure optimized at this time.  I would at least like to have him make adjustments to his humidity, and also optimize his pressure prior to giving up on this mode of therapy.  I discussed other treatment options with him, including surgery and dental appliance.  He would like to try the above suggestions first, and we'll see how he does after the changes.

## 2010-09-11 ENCOUNTER — Other Ambulatory Visit: Payer: Self-pay | Admitting: Pulmonary Disease

## 2010-09-11 DIAGNOSIS — G4733 Obstructive sleep apnea (adult) (pediatric): Secondary | ICD-10-CM

## 2010-09-26 ENCOUNTER — Other Ambulatory Visit: Payer: Self-pay | Admitting: Internal Medicine

## 2011-02-03 ENCOUNTER — Encounter: Payer: Self-pay | Admitting: Internal Medicine

## 2011-02-09 ENCOUNTER — Ambulatory Visit: Payer: BC Managed Care – PPO | Admitting: Pulmonary Disease

## 2011-02-22 ENCOUNTER — Ambulatory Visit (INDEPENDENT_AMBULATORY_CARE_PROVIDER_SITE_OTHER): Payer: BC Managed Care – PPO | Admitting: Internal Medicine

## 2011-02-22 ENCOUNTER — Encounter: Payer: Self-pay | Admitting: Internal Medicine

## 2011-02-22 VITALS — BP 138/80 | HR 68 | Temp 97.7°F | Ht 69.0 in | Wt 194.0 lb

## 2011-02-22 DIAGNOSIS — I10 Essential (primary) hypertension: Secondary | ICD-10-CM

## 2011-02-22 DIAGNOSIS — G4733 Obstructive sleep apnea (adult) (pediatric): Secondary | ICD-10-CM

## 2011-02-22 DIAGNOSIS — Z0000 Encounter for general adult medical examination without abnormal findings: Secondary | ICD-10-CM | POA: Insufficient documentation

## 2011-02-22 LAB — CBC WITH DIFFERENTIAL/PLATELET
Basophils Relative: 0.4 % (ref 0.0–3.0)
Eosinophils Absolute: 0.1 10*3/uL (ref 0.0–0.7)
Eosinophils Relative: 2 % (ref 0.0–5.0)
Hemoglobin: 14.6 g/dL (ref 13.0–17.0)
MCHC: 34.4 g/dL (ref 30.0–36.0)
MCV: 87.8 fl (ref 78.0–100.0)
Monocytes Absolute: 0.5 10*3/uL (ref 0.1–1.0)
Neutro Abs: 3.3 10*3/uL (ref 1.4–7.7)
RBC: 4.82 Mil/uL (ref 4.22–5.81)
WBC: 5.6 10*3/uL (ref 4.5–10.5)

## 2011-02-22 LAB — BASIC METABOLIC PANEL
CO2: 29 mEq/L (ref 19–32)
Chloride: 103 mEq/L (ref 96–112)
Potassium: 4.3 mEq/L (ref 3.5–5.1)
Sodium: 140 mEq/L (ref 135–145)

## 2011-02-22 LAB — LDL CHOLESTEROL, DIRECT: Direct LDL: 164.4 mg/dL

## 2011-02-22 LAB — PSA: PSA: 0.49 ng/mL (ref 0.10–4.00)

## 2011-02-22 LAB — LIPID PANEL: Triglycerides: 63 mg/dL (ref 0.0–149.0)

## 2011-02-22 LAB — HEPATIC FUNCTION PANEL
ALT: 31 U/L (ref 0–53)
AST: 27 U/L (ref 0–37)
Bilirubin, Direct: 0.2 mg/dL (ref 0.0–0.3)
Total Protein: 6.6 g/dL (ref 6.0–8.3)

## 2011-02-22 NOTE — Progress Notes (Signed)
Subjective:    Patient ID: Dakota Branch, male    DOB: 1950/08/05, 61 y.o.   MRN: [id number]  HPI Doing okay in general Just got recall for colonoscopy --has been 5 years  Has some tinnitus in left ear Hearing is okay  Using CPAP with nasal plugs Too much leak Trying to arrange for new mask  Bought exercise machine Has started to use a little  Current Outpatient Prescriptions on File Prior to Visit  Medication Sig Dispense Refill  . amLODipine (NORVASC) 5 MG tablet TAKE ONE TABLET BY MOUTH EVERY DAY  30 tablet  11    Allergies  Allergen Reactions  . Aspirin     REACTION: toxic reaction as a child--but this is unclear  . Lisinopril-Hydrochlorothiazide     REACTION: itching    Past Medical History  Diagnosis Date  . Colon polyps   . GERD (gastroesophageal reflux disease)   . Hypertension   . OSA (obstructive sleep apnea)     Past Surgical History  Procedure Date  . Hepatitis ( prob a) 1978    Family History  Problem Relation Age of Onset  . Hydrocephalus Mother     shunt  . Arthritis Mother   . Hypertension Father   . Heart attack Maternal Grandmother   . Diabetes Maternal Aunt     History   Social History  . Marital Status: Married    Spouse Name: N/A    Number of Children: 4  . Years of Education: N/A   Occupational History  . owner     [employer]   Social History Main Topics  . Smoking status: Former Smoker    Types: Cigarettes  . Smokeless tobacco: Never Used   Comment: Never everyday smoker, "bummed" when in college  . Alcohol Use: Yes     rare  . Drug Use: Not on file  . Sexually Active: Not on file   Other Topics Concern  . Not on file   Social History Narrative   Pt is a Theme park manager.   Review of Systems  Constitutional: Negative for fatigue and unexpected weight change.       Wears seat belt  HENT: Positive for congestion, rhinorrhea and tinnitus. Negative for hearing loss and dental problem.        Spring  allergy symptoms---generally doesn't need meds Regular with dentist  Eyes: Negative for visual disturbance.       No diplopia or unilateral vision loss  Respiratory: Negative for cough, chest tightness and shortness of breath.   Cardiovascular: Negative for chest pain, palpitations and leg swelling.  Gastrointestinal: Negative for nausea, vomiting, abdominal pain, constipation and blood in stool.       Occ heartburn--no meds  Genitourinary: Negative for frequency and difficulty urinating.       No sexual problems  Musculoskeletal: Positive for arthralgias. Negative for back pain and joint swelling.       Fleeting scattered joint pains---mild  Skin: Negative for rash.       Some itching on backs of hands  Neurological: Negative for dizziness, syncope, weakness, light-headedness, numbness and headaches.  Hematological: Negative for adenopathy. Does not bruise/bleed easily.  Psychiatric/Behavioral: Positive for sleep disturbance. Negative for dysphoric mood. The patient is not nervous/anxious.        Objective:   Physical Exam  Constitutional: He is oriented to person, place, and time. He appears well-developed and well-nourished. No distress.  HENT:  Head: Normocephalic and atraumatic.  Right Ear: External ear normal.  Left Ear: External ear normal.  Mouth/Throat: Oropharynx is clear and moist. No oropharyngeal exudate.       TMs normal  Eyes: Conjunctivae and EOM are normal. Pupils are equal, round, and reactive to light.  Neck: Normal range of motion. Neck supple. No thyromegaly present.  Cardiovascular: Normal rate, regular rhythm, normal heart sounds and intact distal pulses.  Exam reveals no gallop.   No murmur heard. Pulmonary/Chest: Effort normal and breath sounds normal. No respiratory distress. He has no wheezes. He has no rales.  Abdominal: Soft. He exhibits no mass. There is no tenderness.  Musculoskeletal: Normal range of motion. He exhibits no edema and no tenderness.    Lymphadenopathy:    He has no cervical adenopathy.  Neurological: He is alert and oriented to person, place, and time.  Skin: Skin is warm. No rash noted. No erythema.  Psychiatric: He has a normal mood and affect. His behavior is normal. Judgment and thought content normal.          Assessment & Plan:

## 2011-02-22 NOTE — Assessment & Plan Note (Signed)
Doing well Needs to work on fitness Will check PSA He is due for repeat colon--got recall letter and he will set up

## 2011-02-22 NOTE — Assessment & Plan Note (Signed)
Still sees Dr Shelle Iron Hasn't been using machine--needs to try new mask

## 2011-02-22 NOTE — Assessment & Plan Note (Signed)
BP Readings from Last 3 Encounters:  02/22/11 138/80  08/11/10 120/68  08/10/10 138/80   Doing fine No changes needed Check labs

## 2011-04-05 ENCOUNTER — Encounter: Payer: Self-pay | Admitting: Internal Medicine

## 2011-04-24 ENCOUNTER — Ambulatory Visit (AMBULATORY_SURGERY_CENTER): Payer: BC Managed Care – PPO | Admitting: *Deleted

## 2011-04-24 ENCOUNTER — Encounter: Payer: Self-pay | Admitting: Internal Medicine

## 2011-04-24 VITALS — Ht 69.0 in | Wt 189.9 lb

## 2011-04-24 DIAGNOSIS — Z1211 Encounter for screening for malignant neoplasm of colon: Secondary | ICD-10-CM

## 2011-04-24 MED ORDER — PEG-KCL-NACL-NASULF-NA ASC-C 100 G PO SOLR: 1.0000 | Freq: Once | ORAL | Status: DC

## 2011-05-05 ENCOUNTER — Ambulatory Visit (AMBULATORY_SURGERY_CENTER): Payer: BC Managed Care – PPO | Admitting: Internal Medicine

## 2011-05-05 ENCOUNTER — Encounter: Payer: Self-pay | Admitting: Internal Medicine

## 2011-05-05 VITALS — BP 148/85 | HR 83 | Temp 98.8°F | Resp 21 | Ht 69.0 in | Wt 189.0 lb

## 2011-05-05 DIAGNOSIS — Z1211 Encounter for screening for malignant neoplasm of colon: Secondary | ICD-10-CM

## 2011-05-05 DIAGNOSIS — K648 Other hemorrhoids: Secondary | ICD-10-CM

## 2011-05-05 DIAGNOSIS — K5730 Diverticulosis of large intestine without perforation or abscess without bleeding: Secondary | ICD-10-CM

## 2011-05-05 DIAGNOSIS — Z86010 Personal history of colonic polyps: Secondary | ICD-10-CM

## 2011-05-05 MED ORDER — SODIUM CHLORIDE 0.9 % IV SOLN: 500.0000 mL | INTRAVENOUS | Status: DC

## 2011-05-05 NOTE — Op Note (Signed)
[facility] [address] [place], [place]  [address]  COLONOSCOPY PROCEDURE REPORT  PATIENT:  Dakota Branch, Dakota Branch  MR#:  [id number] BIRTHDATE:  12/23/1950, 60 yrs. old  GENDER:  male ENDOSCOPIST:  Iva Boop, MD, [facility]  PROCEDURE DATE:  05/05/2011 PROCEDURE:  Colonoscopy 62130 ASA CLASS:  Class II INDICATIONS:  surveillance and high-risk screening, history of polyps 6mm ascending hyperplastic polyp 2008 MEDICATIONS:   These medications were titrated to patient response per physician's verbal order, Fentanyl 75 mcg IV, Versed 7 mg IV  DESCRIPTION OF PROCEDURE:   After the risks benefits and alternatives of the procedure were thoroughly explained, informed consent was obtained.  Digital rectal exam was performed and revealed no abnormalities and normal prostate.   The LB CF-[id number] endoscope was introduced through the anus and advanced to the cecum, which was identified by both the appendix and ileocecal valve, without limitations.  The quality of the prep was excellent, using MoviPrep.  The instrument was then slowly withdrawn as the colon was fully examined. <<PROCEDUREIMAGES>>  FINDINGS:  Mild diverticulosis was found in the sigmoid colon. This was otherwise a normal examination of the colon. Includes right colon retroflexion.   Retroflexed views in the rectum revealed internal hemorrhoids.    The time to cecum = 3:15 minutes. The scope was then withdrawn in 11:33 minutes from the cecum and the procedure completed. COMPLICATIONS:  None ENDOSCOPIC IMPRESSION: 1) Mild diverticulosis in the sigmoid colon 2) Internal hemorrhoids 3) Otherwise normal examination, excellent prep 4) Prior history of 6 mm right-sided hyperplastic polyp  REPEAT EXAM:  In 10 year(s) for routine screening colonoscopy.  Iva Boop, MD, [facility]  CC:  The Patient  n. eSIGNED:   Iva Boop at 05/05/2011 02:28 PM  Cheri Guppy, [phone]

## 2011-05-05 NOTE — Patient Instructions (Signed)
YOU HAD AN ENDOSCOPIC PROCEDURE TODAY AT THE [facility]: Refer to the procedure report that was given to you for any specific questions about what was found during the examination.  If the procedure report does not answer your questions, please call your gastroenterologist to clarify.  If you requested that your care partner not be given the details of your procedure findings, then the procedure report has been included in a sealed envelope for you to review at your convenience later.  YOU SHOULD EXPECT: Some feelings of bloating in the abdomen. Passage of more gas than usual.  Walking can help get rid of the air that was put into your GI tract during the procedure and reduce the bloating. If you had a lower endoscopy (such as a colonoscopy or flexible sigmoidoscopy) you may notice spotting of blood in your stool or on the toilet paper. If you underwent a bowel prep for your procedure, then you may not have a normal bowel movement for a few days.  DIET: Your first meal following the procedure should be a light meal and then it is ok to progress to your normal diet.  A half-sandwich or bowl of soup is an example of a good first meal.  Heavy or fried foods are harder to digest and may make you feel nauseous or bloated.  Likewise meals heavy in dairy and vegetables can cause extra gas to form and this can also increase the bloating.  Drink plenty of fluids but you should avoid alcoholic beverages for 24 hours.  ACTIVITY: Your care partner should take you home directly after the procedure.  You should plan to take it easy, moving slowly for the rest of the day.  You can resume normal activity the day after the procedure however you should NOT DRIVE or use heavy machinery for 24 hours (because of the sedation medicines used during the test).    SYMPTOMS TO REPORT IMMEDIATELY: A gastroenterologist can be reached at any hour.  During normal business hours, 8:30 AM to 5:00 PM Monday through Friday,  call [phone].  After hours and on weekends, please call the GI answering service at [phone] who will take a message and have the physician on call contact you.   Following lower endoscopy (colonoscopy or flexible sigmoidoscopy):  Excessive amounts of blood in the stool  Significant tenderness or worsening of abdominal pains  Swelling of the abdomen that is new, acute  Fever of 100F or higher  Following upper endoscopy (EGD)  Vomiting of blood or coffee ground material  New chest pain or pain under the shoulder blades  Painful or persistently difficult swallowing  New shortness of breath  Fever of 100F or higher  Black, tarry-looking stools  FOLLOW UP: If any biopsies were taken you will be contacted by phone or by letter within the next 1-3 weeks.  Call your gastroenterologist if you have not heard about the biopsies in 3 weeks.  Our staff will call the home number listed on your records the next business day following your procedure to check on you and address any questions or concerns that you may have at that time regarding the information given to you following your procedure. This is a courtesy call and so if there is no answer at the home number and we have not heard from you through the emergency physician on call, we will assume that you have returned to your regular daily activities without incident.  SIGNATURES/CONFIDENTIALITY: You and/or your care   partner have signed paperwork which will be entered into your electronic medical record.  These signatures attest to the fact that that the information above on your After Visit Summary has been reviewed and is understood.  Full responsibility of the confidentiality of this discharge information lies with you and/or your care-partner.  

## 2011-05-08 ENCOUNTER — Telehealth: Payer: Self-pay | Admitting: *Deleted

## 2011-05-08 NOTE — Telephone Encounter (Signed)
  Follow up Call-  Call back number 05/05/2011  Post procedure Call Back phone  # [phone]  Permission to leave phone message Yes     Patient questions:  Do you have a fever, pain , or abdominal swelling? no Pain Score  0 *  Have you tolerated food without any problems? yes  Have you been able to return to your normal activities? yes  Do you have any questions about your discharge instructions: Diet   no Medications  no Follow up visit  no  Do you have questions or concerns about your Care? no  Actions: * If pain score is 4 or above: No action needed, pain <4.

## 2011-05-17 ENCOUNTER — Telehealth: Payer: Self-pay | Admitting: Pulmonary Disease

## 2011-05-17 DIAGNOSIS — G4733 Obstructive sleep apnea (adult) (pediatric): Secondary | ICD-10-CM

## 2011-05-17 NOTE — Telephone Encounter (Signed)
Will send to [facility] to take care of.  Thanks.

## 2011-05-17 NOTE — Telephone Encounter (Signed)
Called spoke with patient who was upset regarding his DME company [facility] for his CPAP and supplies.  Pt stated that he has not received good customer service with this company and has received faulty / inadequate supples > his mask caused his face to break out and itch and broke after two days.  Pt is requesting to change DME companies.  Apologized to patient for his difficulties and advised that will send message to [facility] to okay the change.  Dr Shelle Iron please advise, thanks.  Patient is requesting call back when this is taken care of.

## 2011-05-18 NOTE — Telephone Encounter (Signed)
i need a new order for cpap supplies and change of dme

## 2011-05-18 NOTE — Telephone Encounter (Signed)
Order has been placed. Please advise [facility], thanks 

## 2011-05-19 NOTE — Telephone Encounter (Signed)
Order sent to [facility]. Carron Curie, CMA

## 2011-06-13 ENCOUNTER — Telehealth: Payer: Self-pay | Admitting: Pulmonary Disease

## 2011-06-13 DIAGNOSIS — G4733 Obstructive sleep apnea (adult) (pediatric): Secondary | ICD-10-CM

## 2011-06-13 NOTE — Telephone Encounter (Signed)
Called, spoke with pt who states he was told by [facility] that they could not help him with cpap supplies bc his current machine is a rental through [employer].  He would like this to be fixed.    On 05/18/11 an order was sent to [facility] for:  Needs cpap supplies and change DME from [facility].    [facility]CCs, will you pls help with this?  Should [facility] have supplied pt with a machine when this order was sent?

## 2011-06-13 NOTE — Telephone Encounter (Signed)
Error. New msg coming. Kathleen W Perdue °

## 2011-06-14 NOTE — Telephone Encounter (Signed)
Order placed and sent to [facility]. Shivon Hackel, CMA  

## 2011-06-14 NOTE — Telephone Encounter (Signed)
Need and order to dc cpap through [facility] and a new order to get cpap nad supplies with [facility] thanks.me

## 2011-06-22 ENCOUNTER — Telehealth: Payer: Self-pay | Admitting: Pulmonary Disease

## 2011-06-22 NOTE — Telephone Encounter (Signed)
Called [facility] about pt's equiptment Dakota Branch

## 2011-10-01 ENCOUNTER — Other Ambulatory Visit: Payer: Self-pay | Admitting: Internal Medicine

## 2011-10-20 ENCOUNTER — Ambulatory Visit (INDEPENDENT_AMBULATORY_CARE_PROVIDER_SITE_OTHER): Payer: BC Managed Care – PPO | Admitting: Internal Medicine

## 2011-10-20 ENCOUNTER — Encounter: Payer: Self-pay | Admitting: Internal Medicine

## 2011-10-20 VITALS — BP 138/70 | HR 72 | Temp 98.2°F | Wt 191.0 lb

## 2011-10-20 DIAGNOSIS — M25512 Pain in left shoulder: Secondary | ICD-10-CM

## 2011-10-20 DIAGNOSIS — J029 Acute pharyngitis, unspecified: Secondary | ICD-10-CM

## 2011-10-20 DIAGNOSIS — M25511 Pain in right shoulder: Secondary | ICD-10-CM

## 2011-10-20 DIAGNOSIS — M25519 Pain in unspecified shoulder: Secondary | ICD-10-CM

## 2011-10-20 NOTE — Progress Notes (Signed)
  Subjective:    Patient ID: Dakota Branch, male    DOB: 1950-01-31, 61 y.o.   MRN: [id number]  HPI Has had sore throat for 7-10 days Irritated but no trouble swallowing No cough, nasal congestion, rhinorrhea or fever Tried coricidin--no clear help  Has severe pain in shoulders and hips--esp shoulders Did do some yard [employer] with concrete bricks (he thinks before the soreness started) Tried some lubricant oil on it--not really too helpful  Current Outpatient Prescriptions on File Prior to Visit  Medication Sig Dispense Refill  . amLODipine (NORVASC) 5 MG tablet TAKE ONE TABLET BY MOUTH EVERY DAY  30 tablet  10   Current Facility-Administered Medications on File Prior to Visit  Medication Dose Route Frequency Provider Last Rate Last Dose  . 0.9 %  sodium chloride infusion  500 mL Intravenous Continuous Iva Boop, MD        Allergies  Allergen Reactions  . Aspirin     REACTION: toxic reaction as a child--but this is unclear  . Lisinopril-Hydrochlorothiazide     REACTION: itching    Past Medical History  Diagnosis Date  . Colon polyps   . GERD (gastroesophageal reflux disease)   . Hypertension   . OSA (obstructive sleep apnea)     Past Surgical History  Procedure Date  . Hepatitis ( prob a) 1978    Family History  Problem Relation Age of Onset  . Hydrocephalus Mother     shunt  . Arthritis Mother   . Hypertension Father   . Heart attack Maternal Grandmother   . Diabetes Maternal Aunt   . Colon cancer Neg Hx   . Esophageal cancer Neg Hx   . Rectal cancer Neg Hx   . Stomach cancer Neg Hx     History   Social History  . Marital Status: Married    Spouse Name: N/A    Number of Children: 4  . Years of Education: N/A   Occupational History  . owner     [employer]   Social History Main Topics  . Smoking status: Former Smoker    Types: Cigarettes  . Smokeless tobacco: Never Used   Comment: Never everyday smoker, "bummed" when in college   . Alcohol Use: Yes     rare  . Drug Use: No  . Sexually Active: Not on file   Other Topics Concern  . Not on file   Social History Narrative   Pt is a Theme park manager.   Review of Systems No rash No vomiting or diarrhea Appetite is okay     Objective:   Physical Exam  Constitutional: He appears well-developed and well-nourished. No distress.  HENT:  Head: Normocephalic.  Right Ear: External ear normal.  Left Ear: External ear normal.  Mouth/Throat: No oropharyngeal exudate.       Mild pharyngeal injection TMs normal  Neck: Normal range of motion. Neck supple. No thyromegaly present.  Pulmonary/Chest: Effort normal and breath sounds normal. No respiratory distress. He has no wheezes. He has no rales.  Musculoskeletal:       Normal ROM in shoulders but some pain with external rotation on the right Hips have normal internal rotation bilaterally  Lymphadenopathy:    He has no cervical adenopathy.          Assessment & Plan:

## 2011-10-20 NOTE — Assessment & Plan Note (Signed)
And hips Could be related to concurrent viral infection but I suspect this is due to the lifting of all the concrete bricks he did No weakness noted  Discussed NSAIDs and heat

## 2011-10-20 NOTE — Patient Instructions (Signed)
Try a warm compress or wrap for your shoulder pain. You can try naproxen 220mg  1-2 twice a day, or ibuprofen 200mg  2-3 three times a day--to help with the pain

## 2011-10-20 NOTE — Assessment & Plan Note (Signed)
Really seems viral Should be easing up soon No reason for suspicion of strep---no exudates, nodes, fever Discussed supportive care

## 2012-02-23 ENCOUNTER — Encounter: Payer: Self-pay | Admitting: Internal Medicine

## 2012-02-23 ENCOUNTER — Ambulatory Visit (INDEPENDENT_AMBULATORY_CARE_PROVIDER_SITE_OTHER): Payer: BC Managed Care – PPO | Admitting: Internal Medicine

## 2012-02-23 VITALS — BP 138/80 | HR 77 | Temp 98.1°F | Wt 195.0 lb

## 2012-02-23 DIAGNOSIS — Z0000 Encounter for general adult medical examination without abnormal findings: Secondary | ICD-10-CM

## 2012-02-23 DIAGNOSIS — E785 Hyperlipidemia, unspecified: Secondary | ICD-10-CM

## 2012-02-23 DIAGNOSIS — L570 Actinic keratosis: Secondary | ICD-10-CM

## 2012-02-23 DIAGNOSIS — I10 Essential (primary) hypertension: Secondary | ICD-10-CM

## 2012-02-23 DIAGNOSIS — N529 Male erectile dysfunction, unspecified: Secondary | ICD-10-CM | POA: Insufficient documentation

## 2012-02-23 LAB — BASIC METABOLIC PANEL
BUN: 15 mg/dL (ref 6–23)
CO2: 30 mEq/L (ref 19–32)
Calcium: 9.1 mg/dL (ref 8.4–10.5)
GFR: 99.22 mL/min (ref >60.00)
Glucose, Bld: 108 mg/dL — ABNORMAL HIGH (ref 70–99)
Potassium: 4 mEq/L (ref 3.5–5.1)
Sodium: 138 mEq/L (ref 135–145)

## 2012-02-23 LAB — HEPATIC FUNCTION PANEL
Albumin: 4.2 g/dL (ref 3.5–5.2)
Total Protein: 7.1 g/dL (ref 6.0–8.3)

## 2012-02-23 LAB — CBC WITH DIFFERENTIAL/PLATELET
Basophils Absolute: 0 10*3/uL (ref 0.0–0.1)
Eosinophils Absolute: 0.1 10*3/uL (ref 0.0–0.7)
HCT: 43.9 % (ref 39.0–52.0)
Hemoglobin: 15.1 g/dL (ref 13.0–17.0)
Lymphs Abs: 1.9 10*3/uL (ref 0.7–4.0)
MCHC: 34.5 g/dL (ref 30.0–36.0)
Monocytes Absolute: 0.5 10*3/uL (ref 0.1–1.0)
Monocytes Relative: 9.1 % (ref 3.0–12.0)
Neutro Abs: 3.1 10*3/uL (ref 1.4–7.7)
Platelets: 248 10*3/uL (ref 150.0–400.0)
RDW: 12.4 % (ref 11.5–14.6)

## 2012-02-23 LAB — LIPID PANEL
HDL: 32.3 mg/dL — ABNORMAL LOW (ref >39.00)
Triglycerides: 128 mg/dL (ref 0.0–149.0)

## 2012-02-23 LAB — TSH: TSH: 0.93 u[IU]/mL (ref 0.35–5.50)

## 2012-02-23 LAB — LDL CHOLESTEROL, DIRECT: Direct LDL: 140.1 mg/dL

## 2012-02-23 MED ORDER — TADALAFIL 20 MG PO TABS: 10.0000 mg | ORAL_TABLET | Freq: Every day | ORAL | Status: DC | PRN

## 2012-02-23 MED ORDER — AMLODIPINE BESYLATE 5 MG PO TABS: 5.0000 mg | ORAL_TABLET | Freq: Every day | ORAL | Status: DC

## 2012-02-23 NOTE — Progress Notes (Signed)
Subjective:    Patient ID: Dakota Branch, male    DOB: 1950-03-27, 62 y.o.   MRN: [id number]  HPI Here for physical Shoulders got better but now having some more problems with them at night Has point tenderness close to acromial head No meds for this May have been after he lifted a case of water Does exercise on bicycle at times Discussed that he should try some muscle strengthening  Has noted BP high at times May check after lunch (own machine) 145-150/80's No caffeine  Discussed cholesterol again He is not excited about primary prevention with meds No strong FH of CAD  No current outpatient prescriptions on file prior to visit.   No current facility-administered medications on file prior to visit.    Allergies  Allergen Reactions  . Aspirin     REACTION: toxic reaction as a child--but this is unclear  . Lisinopril-Hydrochlorothiazide     REACTION: itching    Past Medical History  Diagnosis Date  . Colon polyps   . GERD (gastroesophageal reflux disease)   . Hypertension   . OSA (obstructive sleep apnea)     Past Surgical History  Procedure Laterality Date  . Hepatitis ( prob a)  1978    Family History  Problem Relation Age of Onset  . Hydrocephalus Mother     shunt  . Arthritis Mother   . Hypertension Father   . Heart attack Maternal Grandmother   . Diabetes Maternal Aunt   . Colon cancer Neg Hx   . Esophageal cancer Neg Hx   . Rectal cancer Neg Hx   . Stomach cancer Neg Hx     History   Social History  . Marital Status: Married    Spouse Name: N/A    Number of Children: 4  . Years of Education: N/A   Occupational History  . owner     [employer]   Social History Main Topics  . Smoking status: Former Smoker    Types: Cigarettes  . Smokeless tobacco: Never Used     Comment: Never everyday smoker, "bummed" when in college  . Alcohol Use: Yes     Comment: rare  . Drug Use: No  . Sexually Active: Not on file   Other Topics Concern   . Not on file   Social History Narrative   Pt is a Theme park manager.   Review of Systems  Constitutional: Negative for fatigue and unexpected weight change.       Wears seat belt  HENT: Positive for congestion, rhinorrhea and tinnitus. Negative for hearing loss and dental problem.        Mild allergy symptoms---no meds Regular with dentist  Eyes: Negative for visual disturbance.       No diplopia or unilateral vision loss  Respiratory: Negative for cough, chest tightness and shortness of breath.   Cardiovascular: Negative for chest pain, palpitations and leg swelling.  Gastrointestinal: Negative for nausea, vomiting, abdominal pain, constipation and blood in stool.       Some indigestion--no recent antacid use   Endocrine: Negative for cold intolerance and heat intolerance.  Genitourinary: Negative for urgency, frequency and difficulty urinating.       Some ED--interested in meds  Musculoskeletal: Negative for back pain, joint swelling and arthralgias.  Skin: Negative for rash.       No suspicious lesions but has itchy spot along right nose--may be some bigger  Allergic/Immunologic: Positive for environmental allergies. Negative for immunocompromised state.  Neurological: Negative for  dizziness, syncope, weakness, light-headedness, numbness and headaches.  Psychiatric/Behavioral: Negative for sleep disturbance and dysphoric mood. The patient is not nervous/anxious.        Objective:   Physical Exam  Constitutional: He is oriented to person, place, and time. He appears well-developed and well-nourished. No distress.  HENT:  Head: Normocephalic and atraumatic.  Right Ear: External ear normal.  Left Ear: External ear normal.  Mouth/Throat: Oropharynx is clear and moist. No oropharyngeal exudate.  Eyes: Conjunctivae and EOM are normal. Pupils are equal, round, and reactive to light.  Neck: Normal range of motion. Neck supple. No thyromegaly present.  Cardiovascular:  Normal rate, regular rhythm, normal heart sounds and intact distal pulses.  Exam reveals no gallop.   No murmur heard. Pulmonary/Chest: Effort normal and breath sounds normal. No respiratory distress. He has no wheezes. He has no rales.  Abdominal: Soft. There is no tenderness.  Musculoskeletal: He exhibits no edema and no tenderness.  Lymphadenopathy:    He has no cervical adenopathy.  Neurological: He is alert and oriented to person, place, and time.  Skin: No rash noted. No erythema.  Actinic on right side of nose  Psychiatric: He has a normal mood and affect. His behavior is normal.          Assessment & Plan:

## 2012-02-23 NOTE — Assessment & Plan Note (Signed)
Liquid nitrogen 45 seconds x 2 to the lesion on nose Tolerated well Discussed home care

## 2012-02-23 NOTE — Assessment & Plan Note (Signed)
BP Readings from Last 3 Encounters:  02/23/12 138/80  10/20/11 138/70  05/05/11 148/85   Reasonable control Due for labs No change

## 2012-02-23 NOTE — Assessment & Plan Note (Signed)
Discussed primary prevention--will hold off on meds recheck

## 2012-02-23 NOTE — Assessment & Plan Note (Signed)
Fairly healthy Discussed improved fitness UTD on colon Discussed PSA--will defer to every other year

## 2012-02-23 NOTE — Assessment & Plan Note (Signed)
Trial of cialis. ?

## 2012-02-26 ENCOUNTER — Encounter: Payer: Self-pay | Admitting: *Deleted

## 2012-09-16 ENCOUNTER — Telehealth: Payer: Self-pay

## 2012-09-16 NOTE — Telephone Encounter (Signed)
.  left message to have patient return my call.  

## 2012-09-16 NOTE — Telephone Encounter (Signed)
Pt is at [employer] and school requires 3 tetanus injections; pt said he needs tdap otherwise will drop pt from school; pt said only has 2 days to get updated.I looked in paper chart, centricity and epic could only find any injections other than 01/03/1993 td and 01/14/2009 tdap. Pt said he goes to class all day and will be available for injection on 09/17/12. Pt request cb ASAP.

## 2012-09-16 NOTE — Telephone Encounter (Signed)
Please check his full immunization status I would generally not recommend repeating the tetanus so soon, but we can if he never finished a primary series  He would get Td only if he needed another

## 2012-09-17 ENCOUNTER — Telehealth: Payer: Self-pay | Admitting: Family Medicine

## 2012-09-17 NOTE — Telephone Encounter (Signed)
Confidential Office Message [address] [place], [place] [address] p. [phone] f. [phone] To: [facility] (After Hours Triage) Fax: [phone] From: [facility] Date/ Time: 09/16/2012 5:41 PM Taken By: Eduardo Osier, CSR Caller: Conor Facility: home Patient: Dakota Branch, Dakota Branch DOB: 1950/10/12 Phone: [phone] Reason for Call: Pt is student at [employer], left a msg for Dr Karle Starch nurse re: his tetanus shots. He receive msg from the RN saying that he only had to have a shot every 10 yrs but he says the school is requiring that he have records showing that he has had 3 tetanus shots so he needs to come in to office to have one more tetanus shot since he's only had two. He will also call the office back tomorrow. Regarding Appointment: Appt Date: Appt Time: Unknown Provider: Reason: Confidential Details: Outcome:

## 2012-09-17 NOTE — Telephone Encounter (Signed)
Spoke with patient and he will get his records from [employer] and let me know exactly what he needs, if so he will come in for a nurse visit for TD

## 2012-09-17 NOTE — Telephone Encounter (Signed)
Okay to give Td again He has already had Tdap

## 2012-09-22 ENCOUNTER — Ambulatory Visit (INDEPENDENT_AMBULATORY_CARE_PROVIDER_SITE_OTHER): Payer: BC Managed Care – PPO | Admitting: Emergency Medicine

## 2012-09-22 VITALS — BP 118/74 | HR 73 | Temp 98.4°F | Resp 17 | Ht 71.0 in | Wt 188.0 lb

## 2012-09-22 DIAGNOSIS — Z9189 Other specified personal risk factors, not elsewhere classified: Secondary | ICD-10-CM

## 2012-09-22 DIAGNOSIS — Z23 Encounter for immunization: Secondary | ICD-10-CM

## 2012-09-22 DIAGNOSIS — Z2839 Other underimmunization status: Secondary | ICD-10-CM

## 2012-09-22 NOTE — Progress Notes (Signed)
  Subjective:    Patient ID: Dakota Branch, male    DOB: 1950-12-25, 62 y.o.   MRN: [id number]  HPI patient enters  to have his third tetanus shot. His last one was 5 years ago. He needs 3 shots to be at [employer]. 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    Review of Systems     Objective:   Physical Exam  Patient is alert and cooperative in no distress       Assessment & Plan:  The patient was given his third tetanus immunization. This was documented.

## 2012-09-22 NOTE — Progress Notes (Deleted)

## 2012-09-22 NOTE — Addendum Note (Signed)
Addended by: Thelma Barge D on: 09/22/2012 06:35 PM   Modules accepted: Orders

## 2012-11-07 ENCOUNTER — Other Ambulatory Visit: Payer: Self-pay

## 2013-01-07 ENCOUNTER — Ambulatory Visit (INDEPENDENT_AMBULATORY_CARE_PROVIDER_SITE_OTHER): Payer: BC Managed Care – PPO | Admitting: Internal Medicine

## 2013-01-07 ENCOUNTER — Encounter: Payer: Self-pay | Admitting: Internal Medicine

## 2013-01-07 ENCOUNTER — Telehealth: Payer: Self-pay | Admitting: Internal Medicine

## 2013-01-07 VITALS — BP 136/76 | HR 69 | Temp 98.4°F | Wt 193.0 lb

## 2013-01-07 DIAGNOSIS — I10 Essential (primary) hypertension: Secondary | ICD-10-CM

## 2013-01-07 LAB — HM DIABETES EYE EXAM

## 2013-01-07 NOTE — Progress Notes (Signed)
Pre-visit discussion using our clinic review tool. No additional management support is needed unless otherwise documented below in the visit note.  

## 2013-01-07 NOTE — Telephone Encounter (Signed)
See OV note.  

## 2013-01-07 NOTE — Patient Instructions (Signed)
Please check your blood pressure weekly and write them down. Check them when sitting and relaxed. Bring your machine in with you at your next visit

## 2013-01-07 NOTE — Telephone Encounter (Signed)
Pt walked in today BP 168/79; for 2 weeks pt said BP has been elevated; pt has not missed taking medicine. Area between lt neck and shoulder uncomfortable but no pain; slight h/a,blurred vision, pt said sees thunder in eyes ( ? Light) No chest pain,dizziness or SOB. Dr Silvio Pate will see pt today at 2:45pm.

## 2013-01-07 NOTE — Assessment & Plan Note (Signed)
BP Readings from Last 3 Encounters:  01/07/13 136/76  09/22/12 118/74  02/23/12 138/80   High at home but okay here Will have him check and bring in his machine No changes for now

## 2013-01-07 NOTE — Telephone Encounter (Signed)
Patient Information:  Caller Name: George  Phone: [phone]  Patient: Dakota Branch, Dakota Branch  Gender: Male  DOB: [birth year]  Age: 63 Years  PCP: Viviana Simpler [facility])  Office Follow Up:  Does the office need to follow up with this patient?: No  Instructions For The Office: N/A   Symptoms  Reason For Call & Symptoms: 01/07/13 BP today 168/79, seeing a lot of black floaters in eyes, at night seeing bright flashes of light.   Pt states he is at office now and at front desk, no triage done, states he will talk with receptionist to see Dr Silvio Pate at this time  Reviewed Health History In EMR: N/A  Reviewed Medications In EMR: N/A  Reviewed Allergies In EMR: N/A  Reviewed Surgeries / Procedures: N/A  Date of Onset of Symptoms: 01/07/2013  Guideline(s) Used:  No Protocol Available - Sick Adult  Disposition Per Guideline:   N/A  Reason For Disposition Reached:   N/A  Advice Given:  N/A  Patient Will Follow Care Advice:  YES - pt is current AT the office

## 2013-01-07 NOTE — Progress Notes (Signed)
   Subjective:    Patient ID: Dakota Branch, male    DOB: [birth year], 63 y.o.   MRN: [id number]  HPI Having problems with his BP Checked it a couple of weeks ago at home-- 158/80something Has been checking Today 168/79  Eyes feel funny---sees flash in eyes Going to eye doctor later today--- was seen by friend a week ago and going locally now  No headaches  No chest pain No SOB--gets heavy at times  Current Outpatient Prescriptions on File Prior to Visit  Medication Sig Dispense Refill  . amLODipine (NORVASC) 5 MG tablet Take 1 tablet (5 mg total) by mouth daily.  90 tablet  3  . tadalafil (CIALIS) 20 MG tablet Take 0.5-1 tablets (10-20 mg total) by mouth daily as needed for erectile dysfunction.  3 tablet  11   No current facility-administered medications on file prior to visit.    Allergies  Allergen Reactions  . Aspirin     REACTION: toxic reaction as a child--but this is unclear  . Lisinopril-Hydrochlorothiazide     REACTION: itching    Past Medical History  Diagnosis Date  . Colon polyps   . GERD (gastroesophageal reflux disease)   . Hypertension   . OSA (obstructive sleep apnea)   . ED (erectile dysfunction)   . Hyperlipidemia     Past Surgical History  Procedure Laterality Date  . Hepatitis ( prob a)  1978    Family History  Problem Relation Age of Onset  . Hydrocephalus Mother     shunt  . Arthritis Mother   . Hypertension Father   . Heart attack Maternal Grandmother   . Diabetes Maternal Aunt   . Colon cancer Neg Hx   . Esophageal cancer Neg Hx   . Rectal cancer Neg Hx   . Stomach cancer Neg Hx     History   Social History  . Marital Status: Married    Spouse Name: N/A    Number of Children: 4  . Years of Education: N/A   Occupational History  . owner     [employer]   Social History Main Topics  . Smoking status: Former Smoker    Types: Cigarettes  . Smokeless tobacco: Never Used     Comment: Never everyday smoker, "bummed"  when in college  . Alcohol Use: Yes     Comment: rare  . Drug Use: No  . Sexual Activity: Yes    Birth Control/ Protection: Abstinence   Other Topics Concern  . Not on file   Social History Narrative   Pt is a Teacher, early years/pre.   Review of Systems No weakness No swallowing or speech problems Some joint aching     Objective:   Physical Exam  Constitutional: He appears well-developed and well-nourished. No distress.  Neck: Normal range of motion. Neck supple. No thyromegaly present.  Cardiovascular: Normal rate, regular rhythm and normal heart sounds.  Exam reveals no gallop.   No murmur heard. Pulmonary/Chest: Effort normal and breath sounds normal. No respiratory distress. He has no wheezes. He has no rales.  Musculoskeletal: He exhibits no edema.  Lymphadenopathy:    He has no cervical adenopathy.          Assessment & Plan:

## 2013-01-09 ENCOUNTER — Encounter: Payer: Self-pay | Admitting: Internal Medicine

## 2013-02-21 ENCOUNTER — Other Ambulatory Visit: Payer: Self-pay | Admitting: Internal Medicine

## 2013-02-21 LAB — HM DIABETES EYE EXAM

## 2013-02-24 ENCOUNTER — Other Ambulatory Visit: Payer: Self-pay | Admitting: Internal Medicine

## 2013-02-28 ENCOUNTER — Telehealth: Payer: Self-pay | Admitting: Internal Medicine

## 2013-02-28 ENCOUNTER — Encounter: Payer: BC Managed Care – PPO | Admitting: Internal Medicine

## 2013-02-28 NOTE — Telephone Encounter (Signed)
Called pt on 02/27/13 approximately 2:30 pm to cancel appointment on 02/28/13 @ 8:30 am due to inclement weather, office opening at 10 am.  Pt was very insistent that he was coming in at 10 am to see Dr. Silvio Pate and that I had "given" his appointment time to another pt.  I explained that this was a courtesy call and that I was unable to check Dr. Alla German schedule from my home and we would need to either call him back or he could call Fri 02/28/13 at  10 am when we opened, but this did not satisfy.  Pt continued to talk repeating the same concerns, I again stated that I could call back, but could not schedule from my home.  Patient got loud and irate.  I apologized for not being able to help more at that time and discontinued phone call. / lt

## 2013-03-01 NOTE — Telephone Encounter (Signed)
Please call him and see about rescheduling and explain the situation again

## 2013-03-07 ENCOUNTER — Encounter: Payer: Self-pay | Admitting: Internal Medicine

## 2013-03-07 ENCOUNTER — Ambulatory Visit (INDEPENDENT_AMBULATORY_CARE_PROVIDER_SITE_OTHER): Payer: BC Managed Care – PPO | Admitting: Internal Medicine

## 2013-03-07 VITALS — BP 138/80 | HR 62 | Temp 98.3°F | Ht 71.0 in | Wt 188.0 lb

## 2013-03-07 DIAGNOSIS — Z125 Encounter for screening for malignant neoplasm of prostate: Secondary | ICD-10-CM

## 2013-03-07 DIAGNOSIS — E785 Hyperlipidemia, unspecified: Secondary | ICD-10-CM

## 2013-03-07 DIAGNOSIS — I10 Essential (primary) hypertension: Secondary | ICD-10-CM

## 2013-03-07 DIAGNOSIS — N529 Male erectile dysfunction, unspecified: Secondary | ICD-10-CM

## 2013-03-07 DIAGNOSIS — Z0000 Encounter for general adult medical examination without abnormal findings: Secondary | ICD-10-CM

## 2013-03-07 LAB — CBC WITH DIFFERENTIAL/PLATELET
Basophils Absolute: 0 10*3/uL (ref 0.0–0.1)
Basophils Relative: 0.4 % (ref 0.0–3.0)
EOS PCT: 1.6 % (ref 0.0–5.0)
Eosinophils Absolute: 0.1 10*3/uL (ref 0.0–0.7)
HEMATOCRIT: 45 % (ref 39.0–52.0)
Hemoglobin: 15.1 g/dL (ref 13.0–17.0)
LYMPHS ABS: 1.7 10*3/uL (ref 0.7–4.0)
Lymphocytes Relative: 34.2 % (ref 12.0–46.0)
MCHC: 33.6 g/dL (ref 30.0–36.0)
MCV: 88.4 fl (ref 78.0–100.0)
MONOS PCT: 8.5 % (ref 3.0–12.0)
Monocytes Absolute: 0.4 10*3/uL (ref 0.1–1.0)
Neutro Abs: 2.8 10*3/uL (ref 1.4–7.7)
Neutrophils Relative %: 55.3 % (ref 43.0–77.0)
Platelets: 280 10*3/uL (ref 150.0–400.0)
RBC: 5.1 Mil/uL (ref 4.22–5.81)
RDW: 12.6 % (ref 11.5–14.6)
WBC: 5 10*3/uL (ref 4.5–10.5)

## 2013-03-07 LAB — COMPREHENSIVE METABOLIC PANEL
ALT: 26 U/L (ref 0–53)
AST: 26 U/L (ref 0–37)
Albumin: 4.5 g/dL (ref 3.5–5.2)
Alkaline Phosphatase: 58 U/L (ref 39–117)
BUN: 11 mg/dL (ref 6–23)
CO2: 32 meq/L (ref 19–32)
Calcium: 9.5 mg/dL (ref 8.4–10.5)
Chloride: 105 mEq/L (ref 96–112)
Creatinine, Ser: 0.9 mg/dL (ref 0.4–1.5)
GFR: 97.54 mL/min (ref >60.00)
GLUCOSE: 103 mg/dL — AB (ref 70–99)
Potassium: 4.3 mEq/L (ref 3.5–5.1)
Sodium: 141 mEq/L (ref 135–145)
TOTAL PROTEIN: 6.8 g/dL (ref 6.0–8.3)
Total Bilirubin: 1.7 mg/dL — ABNORMAL HIGH (ref 0.3–1.2)

## 2013-03-07 LAB — LIPID PANEL
Cholesterol: 187 mg/dL (ref 0–200)
HDL: 35.3 mg/dL — AB (ref >39.00)
LDL Cholesterol: 142 mg/dL — ABNORMAL HIGH (ref 0–99)
Total CHOL/HDL Ratio: 5
Triglycerides: 50 mg/dL (ref 0.0–149.0)
VLDL: 10 mg/dL (ref 0.0–40.0)

## 2013-03-07 LAB — T4, FREE: FREE T4: 0.85 ng/dL (ref 0.60–1.60)

## 2013-03-07 LAB — PSA: PSA: 0.52 ng/mL (ref 0.10–4.00)

## 2013-03-07 LAB — TSH: TSH: 1.53 u[IU]/mL (ref 0.35–5.50)

## 2013-03-07 MED ORDER — SILDENAFIL CITRATE 20 MG PO TABS: 60.0000 mg | ORAL_TABLET | Freq: Every day | ORAL | Status: DC | PRN

## 2013-03-07 NOTE — Patient Instructions (Signed)
DASH Diet  The DASH diet stands for "Dietary Approaches to Stop Hypertension." It is a healthy eating plan that has been shown to reduce high blood pressure (hypertension) in as little as 14 days, while also possibly providing other significant health benefits. These other health benefits include reducing the risk of breast cancer after menopause and reducing the risk of type 2 diabetes, heart disease, colon cancer, and stroke. Health benefits also include weight loss and slowing kidney failure in patients with chronic kidney disease.   DIET GUIDELINES  · Limit salt (sodium). Your diet should contain less than 1500 mg of sodium daily.  · Limit refined or processed carbohydrates. Your diet should include mostly whole grains. Desserts and added sugars should be used sparingly.  · Include small amounts of heart-healthy fats. These types of fats include nuts, oils, and tub margarine. Limit saturated and trans fats. These fats have been shown to be harmful in the body.  CHOOSING FOODS   The following food groups are based on a 2000 calorie diet. See your Registered Dietitian for individual calorie needs.  Grains and Grain Products (6 to 8 servings daily)  · Eat More Often: Whole-wheat bread, brown rice, whole-grain or wheat pasta, quinoa, popcorn without added fat or salt (air popped).  · Eat Less Often: White bread, white pasta, white rice, cornbread.  Vegetables (4 to 5 servings daily)  · Eat More Often: Fresh, frozen, and canned vegetables. Vegetables may be raw, steamed, roasted, or grilled with a minimal amount of fat.  · Eat Less Often/Avoid: Creamed or fried vegetables. Vegetables in a cheese sauce.  Fruit (4 to 5 servings daily)  · Eat More Often: All fresh, canned (in natural juice), or frozen fruits. Dried fruits without added sugar. One hundred percent fruit juice (½ cup [237 mL] daily).  · Eat Less Often: Dried fruits with added sugar. Canned fruit in light or heavy syrup.  Lean Meats, Fish, and Poultry (2  servings or less daily. One serving is 3 to 4 oz [85-114 g]).  · Eat More Often: Ninety percent or leaner ground beef, tenderloin, sirloin. Round cuts of beef, chicken breast, turkey breast. All fish. Grill, bake, or broil your meat. Nothing should be fried.  · Eat Less Often/Avoid: Fatty cuts of meat, turkey, or chicken leg, thigh, or wing. Fried cuts of meat or fish.  Dairy (2 to 3 servings)  · Eat More Often: Low-fat or fat-free milk, low-fat plain or light yogurt, reduced-fat or part-skim cheese.  · Eat Less Often/Avoid: Milk (whole, 2%). Whole milk yogurt. Full-fat cheeses.  Nuts, Seeds, and Legumes (4 to 5 servings per week)  · Eat More Often: All without added salt.  · Eat Less Often/Avoid: Salted nuts and seeds, canned beans with added salt.  Fats and Sweets (limited)  · Eat More Often: Vegetable oils, tub margarines without trans fats, sugar-free gelatin. Mayonnaise and salad dressings.  · Eat Less Often/Avoid: Coconut oils, palm oils, butter, stick margarine, cream, half and half, cookies, candy, pie.  FOR MORE INFORMATION  The Dash Diet Eating Plan: www.dashdiet.org  Document Released: 12/08/2010 Document Revised: 03/13/2011 Document Reviewed: 12/08/2010  ExitCare® Patient Information ©2014 [employer].

## 2013-03-07 NOTE — Assessment & Plan Note (Signed)
Discussed primary prevention---he doesn't want statin 

## 2013-03-07 NOTE — Progress Notes (Signed)
Subjective:    Patient ID: Dakota Branch, male    DOB: May 05, 1950, 63 y.o.   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 amLODipine (NORVASC) 5 MG tablet TAKE ONE TABLET BY MOUTH ONCE DAILY  90 tablet  0  . tadalafil (CIALIS) 20 MG tablet Take 0.5-1 tablets (10-20 mg total) by mouth daily as needed for erectile dysfunction.  3 tablet  11   No current facility-administered medications on file prior to visit.    Allergies  Allergen Reactions  . Aspirin     REACTION: toxic reaction as a child--but this is unclear  . Lisinopril-Hydrochlorothiazide     REACTION: itching    Past Medical History  Diagnosis Date  . Colon polyps   . GERD (gastroesophageal reflux disease)   . Hypertension   . OSA (obstructive sleep apnea)   . ED (erectile dysfunction)   . Hyperlipidemia     Past Surgical History  Procedure Laterality Date  . Hepatitis ( prob a)  1978    Family History  Problem Relation Age of Onset  . Hydrocephalus Mother     shunt  . Arthritis Mother   . Hypertension Father   . Heart attack Maternal Grandmother   . Diabetes Maternal Aunt   . Colon cancer Neg Hx   . Esophageal cancer Neg Hx   . Rectal cancer Neg Hx   . Stomach cancer Neg Hx     History   Social History  . Marital Status: Married    Spouse Name: N/A    Number of Children: 4  . Years of Education: N/A   Occupational History  . owner     [employer]   Social History Main Topics  . Smoking status: Former Smoker    Types: Cigarettes  . Smokeless tobacco: Never Used     Comment: Never everyday smoker, "bummed" when in college  . Alcohol Use: Yes     Comment: rare  . Drug Use: No  . Sexual  Activity: Yes    Birth Control/ Protection: Abstinence   Other Topics Concern  . Not on file   Social History Narrative   Pt is a Teacher, early years/pre.   Review of Systems  Constitutional: Negative for fatigue and unexpected weight change.       Wears seat belt Fasting for his [employer] faith--this month  HENT: Negative for dental problem, hearing loss and tinnitus.        Regular with dentist  Eyes: Negative for visual disturbance.       No diplopia or unilateral vision loss  Respiratory: Negative for cough, chest tightness and shortness of breath.   Cardiovascular: Negative for chest pain, palpitations and leg swelling.  Gastrointestinal: Negative for nausea, vomiting, abdominal pain, constipation and blood in stool.       Rare heartburn--no meds  Genitourinary: Negative for frequency and difficulty urinating.       No regular nocturia cialis caused muscle pain--rarely uses small amount  Musculoskeletal: Negative for arthralgias, back pain and myalgias.  Skin: Negative for rash.       Did go to dermatologist  Allergic/Immunologic: Positive for environmental allergies. Negative for immunocompromised state.  No meds  Neurological: Negative for dizziness, weakness, light-headedness, numbness and headaches.  Hematological: Negative for adenopathy. Does not bruise/bleed easily.  Psychiatric/Behavioral: Negative for sleep disturbance and dysphoric mood. The patient is not nervous/anxious.        Objective:   Physical Exam  Constitutional: He is oriented to person, place, and time. He appears well-developed and well-nourished. No distress.  HENT:  Head: Normocephalic and atraumatic.  Right Ear: External ear normal.  Left Ear: External ear normal.  Mouth/Throat: Oropharynx is clear and moist. No oropharyngeal exudate.  Eyes: Conjunctivae and EOM are normal. Pupils are equal, round, and reactive to light.  Neck: Normal range of motion. Neck supple. No thyromegaly present.    Cardiovascular: Normal rate, regular rhythm, normal heart sounds and intact distal pulses.  Exam reveals no gallop.   No murmur heard. Pulmonary/Chest: Effort normal and breath sounds normal. No respiratory distress. He has no wheezes. He has no rales.  Abdominal: Soft. There is no tenderness.  Musculoskeletal: He exhibits no edema and no tenderness.  Lymphadenopathy:    He has no cervical adenopathy.  Neurological: He is alert and oriented to person, place, and time.  Skin: No rash noted. No erythema.  Few benign nevi  Psychiatric: He has a normal mood and affect. His behavior is normal.          Assessment & Plan:

## 2013-03-07 NOTE — Assessment & Plan Note (Signed)
Will change to generic of viagra

## 2013-03-07 NOTE — Assessment & Plan Note (Signed)
Fairly healthy Will check PSA after discussion Info on DASH diet

## 2013-03-07 NOTE — Progress Notes (Signed)
Pre visit review using our clinic review tool, if applicable. No additional management support is needed unless otherwise documented below in the visit note. 

## 2013-03-07 NOTE — Assessment & Plan Note (Signed)
BP Readings from Last 3 Encounters:  03/07/13 138/80  01/07/13 136/76  09/22/12 118/74   My repeat on left 134/66. His cuff got 150/87. I recommended he not use that cuff

## 2013-03-08 ENCOUNTER — Telehealth: Payer: Self-pay | Admitting: Internal Medicine

## 2013-03-08 NOTE — Telephone Encounter (Signed)
Relevant patient education assigned to patient using Emmi. ° °

## 2013-03-10 ENCOUNTER — Telehealth: Payer: Self-pay | Admitting: Internal Medicine

## 2013-03-10 ENCOUNTER — Encounter: Payer: BC Managed Care – PPO | Admitting: Internal Medicine

## 2013-03-10 NOTE — Telephone Encounter (Signed)
Relevant patient education assigned to patient using Emmi. ° °

## 2013-03-30 ENCOUNTER — Ambulatory Visit (INDEPENDENT_AMBULATORY_CARE_PROVIDER_SITE_OTHER): Payer: BC Managed Care – PPO | Admitting: Physician Assistant

## 2013-03-30 VITALS — BP 124/78 | HR 86 | Temp 98.3°F | Resp 17 | Ht 69.5 in | Wt 184.0 lb

## 2013-03-30 DIAGNOSIS — J029 Acute pharyngitis, unspecified: Secondary | ICD-10-CM

## 2013-03-30 DIAGNOSIS — R05 Cough: Secondary | ICD-10-CM

## 2013-03-30 DIAGNOSIS — R059 Cough, unspecified: Secondary | ICD-10-CM

## 2013-03-30 LAB — POCT CBC
Granulocyte percent: 68.6 %G (ref 37–80)
HCT, POC: 45.5 % (ref 43.5–53.7)
Hemoglobin: 14.8 g/dL (ref 14.1–18.1)
Lymph, poc: 1.2 (ref 0.6–3.4)
MCH, POC: 29.5 pg (ref 27–31.2)
MCHC: 32.5 g/dL (ref 31.8–35.4)
MCV: 90.7 fL (ref 80–97)
MID (cbc): 0.5 (ref 0–0.9)
MPV: 8.4 fL (ref 0–99.8)
POC Granulocyte: 3.6 (ref 2–6.9)
POC LYMPH PERCENT: 22.3 %L (ref 10–50)
POC MID %: 9.1 %M (ref 0–12)
Platelet Count, POC: 232 10*3/uL (ref 142–424)
RBC: 5.02 M/uL (ref 4.69–6.13)
RDW, POC: 12.8 %
WBC: 5.2 10*3/uL (ref 4.6–10.2)

## 2013-03-30 LAB — POCT RAPID STREP A (OFFICE): Rapid Strep A Screen: NEGATIVE

## 2013-03-30 MED ORDER — BENZONATATE 100 MG PO CAPS: 100.0000 mg | ORAL_CAPSULE | Freq: Three times a day (TID) | ORAL | Status: DC | PRN

## 2013-03-30 MED ORDER — FIRST-DUKES MOUTHWASH MT SUSP: 5.0000 mL | OROMUCOSAL | Status: DC | PRN

## 2013-03-30 NOTE — Patient Instructions (Signed)
Take tylenol 1000 mg every 8 hours as needed for pain Use magic mouthwash every 2-3 hours as needed for pain

## 2013-03-30 NOTE — Progress Notes (Signed)
Subjective:    Patient ID: Dakota Branch, male    DOB: January 31, 1950, 63 y.o.   MRN: [id number]  HPI 63 year old male presents for evaluation of 2 day history of sore throat, with dry, scratchy cough.  States symptoms started yesterday morning and have not improved today. Describes throat as dry and irritated causing a cough.  His cough is not productive. No chest pain, wheezing, SOB, nasal congestion, PND, otalgia, or headache.  Has had subjective fever and chills with body aches.  No documented fever. He has not taken any OTC medications but has been doing salt water gargles.  No known strep contacts or significant hx of strep infections Patient is otherwise doing well with no other concerns today.     Review of Systems  Constitutional: Positive for fever (subejctive) and chills.  HENT: Positive for sore throat. Negative for congestion, ear pain, postnasal drip, rhinorrhea, sinus pressure and trouble swallowing.   Respiratory: Positive for cough. Negative for shortness of breath and wheezing.   Gastrointestinal: Negative for nausea and vomiting.  Neurological: Negative for dizziness and headaches.       Objective:   Physical Exam  Constitutional: He is oriented to person, place, and time. He appears well-developed.  HENT:  Head: Normocephalic and atraumatic.  Right Ear: Hearing, tympanic membrane, external ear and ear canal normal.  Left Ear: Hearing, tympanic membrane, external ear and ear canal normal.  Mouth/Throat: Uvula is midline and mucous membranes are normal. Posterior oropharyngeal erythema present. No oropharyngeal exudate, posterior oropharyngeal edema or tonsillar abscesses.  Eyes: Conjunctivae are normal.  Neck: Normal range of motion. Neck supple.  Cardiovascular: Normal rate, regular rhythm and normal heart sounds.   Pulmonary/Chest: Effort normal and breath sounds normal.  Lymphadenopathy:    He has no cervical adenopathy.  Neurological: He is alert and oriented  to person, place, and time.  Psychiatric: He has a normal mood and affect. His behavior is normal. Judgment and thought content normal.    Results for orders placed in visit on 03/30/13  POCT RAPID STREP A (OFFICE)      Result Value Ref Range   Rapid Strep A Screen Negative  Negative  POCT CBC      Result Value Ref Range   WBC 5.2  4.6 - 10.2 K/uL   Lymph, poc 1.2  0.6 - 3.4   POC LYMPH PERCENT 22.3  10 - 50 %L   MID (cbc) 0.5  0 - 0.9   POC MID % 9.1  0 - 12 %M   POC Granulocyte 3.6  2 - 6.9   Granulocyte percent 68.6  37 - 80 %G   RBC 5.02  4.69 - 6.13 M/uL   Hemoglobin 14.8  14.1 - 18.1 g/dL   HCT, POC 45.5  43.5 - 53.7 %   MCV 90.7  80 - 97 fL   MCH, POC 29.5  27 - 31.2 pg   MCHC 32.5  31.8 - 35.4 g/dL   RDW, POC 12.8     Platelet Count, POC 232  142 - 424 K/uL   MPV 8.4  0 - 99.8 fL         Assessment & Plan:  Acute pharyngitis - Plan: POCT rapid strep A, POCT CBC, Culture, Group A Strep, Diphenhyd-Hydrocort-Nystatin (FIRST-DUKES MOUTHWASH) SUSP  Cough - Plan: benzonatate (TESSALON) 100 MG capsule  Likely viral pharyngitis. Will treat conservatively with [facility]'s mouthwash q2-3 hours Recommend tylenol 1000 mg q8hours prn fever, chills, pain Tessalon perles  100-200 mg tid prn cough Increase fluids and rest Throat culture pending Follow up if symptoms worsen or fail to improve.

## 2013-04-01 LAB — CULTURE, GROUP A STREP: Organism ID, Bacteria: NORMAL

## 2013-04-16 ENCOUNTER — Encounter: Payer: Self-pay | Admitting: Internal Medicine

## 2013-08-21 ENCOUNTER — Other Ambulatory Visit: Payer: Self-pay | Admitting: Internal Medicine

## 2013-11-19 ENCOUNTER — Other Ambulatory Visit: Payer: Self-pay | Admitting: Internal Medicine

## 2014-02-11 ENCOUNTER — Other Ambulatory Visit: Payer: Self-pay | Admitting: Internal Medicine

## 2014-03-05 ENCOUNTER — Ambulatory Visit (INDEPENDENT_AMBULATORY_CARE_PROVIDER_SITE_OTHER): Payer: BC Managed Care – PPO | Admitting: Internal Medicine

## 2014-03-05 ENCOUNTER — Encounter: Payer: Self-pay | Admitting: Internal Medicine

## 2014-03-05 VITALS — BP 138/78 | HR 75 | Temp 97.5°F | Ht 69.25 in | Wt 190.4 lb

## 2014-03-05 DIAGNOSIS — Z0000 Encounter for general adult medical examination without abnormal findings: Secondary | ICD-10-CM

## 2014-03-05 DIAGNOSIS — E785 Hyperlipidemia, unspecified: Secondary | ICD-10-CM

## 2014-03-05 DIAGNOSIS — G4733 Obstructive sleep apnea (adult) (pediatric): Secondary | ICD-10-CM

## 2014-03-05 DIAGNOSIS — I10 Essential (primary) hypertension: Secondary | ICD-10-CM

## 2014-03-05 MED ORDER — ZOSTER VACCINE LIVE 19400 UNT/0.65ML ~~LOC~~ SOLR: 0.6500 mL | Freq: Once | SUBCUTANEOUS | Status: DC

## 2014-03-05 NOTE — Progress Notes (Signed)
Pre visit review using our clinic review tool, if applicable. No additional management support is needed unless otherwise documented below in the visit note. 

## 2014-03-05 NOTE — Assessment & Plan Note (Signed)
Not doing well with the CPAP but wears occasionally

## 2014-03-05 NOTE — Assessment & Plan Note (Signed)
BP Readings from Last 3 Encounters:  03/05/14 138/78  03/30/13 124/78  03/07/13 138/80   Reasonable control Due for labs

## 2014-03-05 NOTE — Assessment & Plan Note (Signed)
Discussed primary prevention He doesn't want statin for now

## 2014-03-05 NOTE — Assessment & Plan Note (Signed)
Defer PSA to next year---done last year Colonoscopy due 2023 Recommended flu vaccine Rx for zostavax Discussed fitness

## 2014-03-05 NOTE — Progress Notes (Signed)
Subjective:    Patient ID: Dakota Branch, male    DOB: [birth year], 64 y.o.   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 amLODipine (NORVASC) 5 MG tablet TAKE ONE TABLET BY MOUTH ONCE DAILY 90 tablet 0   No current facility-administered medications on file prior to visit.    Allergies  Allergen Reactions  . Aspirin     REACTION: toxic reaction as a child--but this is unclear  . Lisinopril-Hydrochlorothiazide     REACTION: itching    Past Medical History  Diagnosis Date  . Colon polyps   . GERD (gastroesophageal reflux disease)   . Hypertension   . OSA (obstructive sleep apnea)   . ED (erectile dysfunction)   . Hyperlipidemia     Past Surgical History  Procedure Laterality Date  . Hepatitis ( prob a)  1978    Family History  Problem Relation Age of Onset  . Hydrocephalus Mother     shunt  . Arthritis Mother   . Hypertension Father   . Heart attack Maternal Grandmother   . Diabetes Maternal Aunt   . Colon cancer Neg Hx   . Esophageal cancer Neg Hx   . Rectal cancer Neg Hx   . Stomach cancer Neg Hx     History   Social History  . Marital Status: Married    Spouse Name: N/A  . Number of Children: 4  . Years of Education: N/A   Occupational History  . owner     [employer]   Social History Main Topics  . Smoking status: Former Smoker    Types: Cigarettes  . Smokeless tobacco: Never Used     Comment: Never everyday smoker, "bummed" when in college  . Alcohol Use: Yes     Comment: rare  . Drug Use: No  . Sexual Activity: Yes    Birth  Control/ Protection: Abstinence   Other Topics Concern  . Not on file   Social History Narrative   Pt is a Teacher, early years/pre.   Review of Systems  Constitutional: Negative for fatigue and unexpected weight change.       Wears seat belt  HENT: Negative for dental problem, hearing loss and tinnitus.        Keeps up with dentist   Eyes: Negative for visual disturbance.       No diplopia or unilateral vision loss  Respiratory: Negative for cough, chest tightness and shortness of breath.   Cardiovascular: Negative for chest pain and leg swelling.  Gastrointestinal: Negative for nausea, vomiting, abdominal pain, constipation and blood in stool.       No heartburn  Endocrine: Negative for polydipsia and polyuria.  Genitourinary: Negative for urgency, frequency and difficulty urinating.       Satisfied with ED med  Musculoskeletal: Negative for back pain, joint swelling and arthralgias.  Skin: Negative for rash.       No suspicious lesions   Allergic/Immunologic: Positive for environmental allergies. Negative for immunocompromised state.       Doesn't use meds Mostly to chemicals/smells  Neurological:  Negative for dizziness, syncope, weakness, light-headedness and headaches.       Brief abnormal sensation in left anterior thigh and medial foot  Hematological: Negative for adenopathy. Does not bruise/bleed easily.  Psychiatric/Behavioral: Positive for sleep disturbance. Negative for dysphoric mood. The patient is not nervous/anxious.        Not happy with CPAP---not comfortable with it Wonders about mandibular advancement device       Objective:   Physical Exam  Constitutional: He is oriented to person, place, and time. He appears well-developed and well-nourished. No distress.  HENT:  Head: Normocephalic and atraumatic.  Right Ear: External ear normal.  Left Ear: External ear normal.  Mouth/Throat: Oropharynx is clear and moist. No oropharyngeal exudate.  Eyes:  Conjunctivae and EOM are normal. Pupils are equal, round, and reactive to light.  Neck: Normal range of motion. Neck supple. No thyromegaly present.  Cardiovascular: Normal rate, regular rhythm, normal heart sounds and intact distal pulses.  Exam reveals no gallop.   No murmur heard. Pulmonary/Chest: Effort normal and breath sounds normal. No respiratory distress. He has no wheezes. He has no rales.  Abdominal: Soft. There is no tenderness.  Musculoskeletal: He exhibits no edema or tenderness.  Lymphadenopathy:    He has no cervical adenopathy.  Neurological: He is alert and oriented to person, place, and time.  Skin: No rash noted. No erythema.  Psychiatric: He has a normal mood and affect. His behavior is normal.          Assessment & Plan:

## 2014-03-06 LAB — LIPID PANEL
CHOLESTEROL: 199 mg/dL (ref 0–200)
HDL: 36.9 mg/dL — ABNORMAL LOW (ref >39.00)
LDL CALC: 144 mg/dL — AB (ref 0–99)
NONHDL: 162.1
Total CHOL/HDL Ratio: 5
Triglycerides: 93 mg/dL (ref 0.0–149.0)
VLDL: 18.6 mg/dL (ref 0.0–40.0)

## 2014-03-06 LAB — COMPREHENSIVE METABOLIC PANEL
ALK PHOS: 66 U/L (ref 39–117)
ALT: 29 U/L (ref 0–53)
AST: 23 U/L (ref 0–37)
Albumin: 4.6 g/dL (ref 3.5–5.2)
BILIRUBIN TOTAL: 1.3 mg/dL — AB (ref 0.2–1.2)
BUN: 16 mg/dL (ref 6–23)
CO2: 32 mEq/L (ref 19–32)
CREATININE: 0.88 mg/dL (ref 0.40–1.50)
Calcium: 9.5 mg/dL (ref 8.4–10.5)
Chloride: 102 mEq/L (ref 96–112)
GFR: 93.4 mL/min (ref >60.00)
GLUCOSE: 98 mg/dL (ref 70–99)
Potassium: 4 mEq/L (ref 3.5–5.1)
Sodium: 138 mEq/L (ref 135–145)
Total Protein: 7.7 g/dL (ref 6.0–8.3)

## 2014-03-06 LAB — CBC WITH DIFFERENTIAL/PLATELET
Basophils Absolute: 0 10*3/uL (ref 0.0–0.1)
Basophils Relative: 0.3 % (ref 0.0–3.0)
EOS PCT: 2.4 % (ref 0.0–5.0)
Eosinophils Absolute: 0.1 10*3/uL (ref 0.0–0.7)
HCT: 44.6 % (ref 39.0–52.0)
Hemoglobin: 15.3 g/dL (ref 13.0–17.0)
Lymphocytes Relative: 38.1 % (ref 12.0–46.0)
Lymphs Abs: 1.9 10*3/uL (ref 0.7–4.0)
MCHC: 34.2 g/dL (ref 30.0–36.0)
MCV: 86.6 fl (ref 78.0–100.0)
MONO ABS: 0.4 10*3/uL (ref 0.1–1.0)
Monocytes Relative: 7.3 % (ref 3.0–12.0)
NEUTROS PCT: 51.9 % (ref 43.0–77.0)
Neutro Abs: 2.7 10*3/uL (ref 1.4–7.7)
Platelets: 277 10*3/uL (ref 150.0–400.0)
RBC: 5.15 Mil/uL (ref 4.22–5.81)
RDW: 12.4 % (ref 11.5–15.5)
WBC: 5.1 10*3/uL (ref 4.0–10.5)

## 2014-03-06 LAB — T4, FREE: FREE T4: 0.79 ng/dL (ref 0.60–1.60)

## 2014-03-12 ENCOUNTER — Encounter: Payer: BC Managed Care – PPO | Admitting: Internal Medicine

## 2014-05-05 ENCOUNTER — Encounter: Payer: Self-pay | Admitting: Internal Medicine

## 2014-05-05 ENCOUNTER — Ambulatory Visit (INDEPENDENT_AMBULATORY_CARE_PROVIDER_SITE_OTHER): Payer: BC Managed Care – PPO | Admitting: Internal Medicine

## 2014-05-05 VITALS — BP 128/70 | HR 87 | Temp 97.8°F | Wt 192.0 lb

## 2014-05-05 DIAGNOSIS — L821 Other seborrheic keratosis: Secondary | ICD-10-CM | POA: Insufficient documentation

## 2014-05-05 DIAGNOSIS — M7522 Bicipital tendinitis, left shoulder: Secondary | ICD-10-CM | POA: Diagnosis not present

## 2014-05-05 NOTE — Progress Notes (Signed)
   Subjective:    Patient ID: Dakota Branch, male    DOB: 1950-09-22, 64 y.o.   MRN: [id number]  HPI Has a mole on his left temple he wants checked Not sure how long it has been there--found it a week ago No change No itching or pain  Having trouble with his left shoulder Trouble even moving his hand towards chest Goes back 2-3 months Doesn't remember any injury Very tender  Current Outpatient Prescriptions on File Prior to Visit  Medication Sig Dispense Refill  . amLODipine (NORVASC) 5 MG tablet TAKE ONE TABLET BY MOUTH ONCE DAILY 90 tablet 0  . sildenafil (REVATIO) 20 MG tablet Take 20 mg by mouth as needed.     No current facility-administered medications on file prior to visit.    Allergies  Allergen Reactions  . Aspirin     REACTION: toxic reaction as a child--but this is unclear  . Lisinopril-Hydrochlorothiazide     REACTION: itching    Past Medical History  Diagnosis Date  . Colon polyps   . GERD (gastroesophageal reflux disease)   . Hypertension   . OSA (obstructive sleep apnea)   . ED (erectile dysfunction)   . Hyperlipidemia     Past Surgical History  Procedure Laterality Date  . Hepatitis ( prob a)  1978    Family History  Problem Relation Age of Onset  . Hydrocephalus Mother     shunt  . Arthritis Mother   . Hypertension Father   . Heart attack Maternal Grandmother   . Diabetes Maternal Aunt   . Colon cancer Neg Hx   . Esophageal cancer Neg Hx   . Rectal cancer Neg Hx   . Stomach cancer Neg Hx     History   Social History  . Marital Status: Married    Spouse Name: N/A  . Number of Children: 4  . Years of Education: N/A   Occupational History  . owner     [employer]   Social History Main Topics  . Smoking status: Former Smoker    Types: Cigarettes  . Smokeless tobacco: Never Used     Comment: Never everyday smoker, "bummed" when in college  . Alcohol Use: Yes     Comment: rare  . Drug Use: No  . Sexual Activity: Yes   Birth Control/ Protection: Abstinence   Other Topics Concern  . Not on file   Social History Narrative   Pt is a Teacher, early years/pre.   Review of Systems No fever No swelling in joints    Objective:   Physical Exam  Musculoskeletal:  Left shoulder--no crepitus Fairly full passive ROM No bursa tenderness Tender at bicipital tendon and pain esp with internal rotation  Skin:  8-58mm seb keratosis in left temple hairline          Assessment & Plan:

## 2014-05-05 NOTE — Assessment & Plan Note (Signed)
Discussed ice and NSAIDs

## 2014-05-05 NOTE — Patient Instructions (Signed)
Please try ice on your left shoulder for 10 minutes at a time--when painful and before bed. It may help to take 1-2 naproxen (220mg --aleve) twice a day to reduce inflammation and pain

## 2014-05-05 NOTE — Progress Notes (Signed)
Pre visit review using our clinic review tool, if applicable. No additional management support is needed unless otherwise documented below in the visit note. 

## 2014-05-05 NOTE — Assessment & Plan Note (Signed)
Reassured Benign process

## 2014-05-07 ENCOUNTER — Encounter: Payer: Self-pay | Admitting: Internal Medicine

## 2014-05-28 ENCOUNTER — Other Ambulatory Visit: Payer: Self-pay | Admitting: Internal Medicine

## 2014-08-20 ENCOUNTER — Other Ambulatory Visit: Payer: Self-pay | Admitting: Internal Medicine

## 2014-11-20 ENCOUNTER — Other Ambulatory Visit: Payer: Self-pay | Admitting: Internal Medicine

## 2015-02-25 ENCOUNTER — Other Ambulatory Visit: Payer: Self-pay | Admitting: Internal Medicine

## 2015-02-25 DIAGNOSIS — I10 Essential (primary) hypertension: Secondary | ICD-10-CM

## 2015-02-25 DIAGNOSIS — Z125 Encounter for screening for malignant neoplasm of prostate: Secondary | ICD-10-CM

## 2015-03-04 ENCOUNTER — Other Ambulatory Visit (INDEPENDENT_AMBULATORY_CARE_PROVIDER_SITE_OTHER): Payer: BC Managed Care – PPO

## 2015-03-04 DIAGNOSIS — Z125 Encounter for screening for malignant neoplasm of prostate: Secondary | ICD-10-CM | POA: Diagnosis not present

## 2015-03-04 DIAGNOSIS — I10 Essential (primary) hypertension: Secondary | ICD-10-CM

## 2015-03-04 LAB — CBC WITH DIFFERENTIAL/PLATELET
BASOS PCT: 0.5 % (ref 0.0–3.0)
Basophils Absolute: 0 10*3/uL (ref 0.0–0.1)
Eosinophils Absolute: 0.2 10*3/uL (ref 0.0–0.7)
Eosinophils Relative: 2 % (ref 0.0–5.0)
HCT: 42.2 % (ref 39.0–52.0)
HEMOGLOBIN: 14.3 g/dL (ref 13.0–17.0)
LYMPHS ABS: 1.6 10*3/uL (ref 0.7–4.0)
Lymphocytes Relative: 20.8 % (ref 12.0–46.0)
MCHC: 34 g/dL (ref 30.0–36.0)
MCV: 86.8 fl (ref 78.0–100.0)
MONO ABS: 0.6 10*3/uL (ref 0.1–1.0)
Monocytes Relative: 7.8 % (ref 3.0–12.0)
Neutro Abs: 5.2 10*3/uL (ref 1.4–7.7)
Neutrophils Relative %: 68.9 % (ref 43.0–77.0)
Platelets: 264 10*3/uL (ref 150.0–400.0)
RBC: 4.86 Mil/uL (ref 4.22–5.81)
RDW: 13.4 % (ref 11.5–15.5)
WBC: 7.5 10*3/uL (ref 4.0–10.5)

## 2015-03-04 LAB — COMPREHENSIVE METABOLIC PANEL
ALT: 27 U/L (ref 0–53)
AST: 19 U/L (ref 0–37)
Albumin: 4.4 g/dL (ref 3.5–5.2)
Alkaline Phosphatase: 64 U/L (ref 39–117)
BUN: 18 mg/dL (ref 6–23)
CHLORIDE: 103 meq/L (ref 96–112)
CO2: 30 mEq/L (ref 19–32)
Calcium: 9.2 mg/dL (ref 8.4–10.5)
Creatinine, Ser: 0.84 mg/dL (ref 0.40–1.50)
GFR: 98.23 mL/min (ref >60.00)
GLUCOSE: 107 mg/dL — AB (ref 70–99)
Potassium: 4.2 mEq/L (ref 3.5–5.1)
SODIUM: 138 meq/L (ref 135–145)
Total Bilirubin: 1.2 mg/dL (ref 0.2–1.2)
Total Protein: 6.9 g/dL (ref 6.0–8.3)

## 2015-03-04 LAB — LIPID PANEL
CHOL/HDL RATIO: 6
Cholesterol: 183 mg/dL (ref 0–200)
HDL: 30.8 mg/dL — AB (ref >39.00)
LDL CALC: 123 mg/dL — AB (ref 0–99)
NONHDL: 151.83
Triglycerides: 145 mg/dL (ref 0.0–149.0)
VLDL: 29 mg/dL (ref 0.0–40.0)

## 2015-03-04 LAB — PSA: PSA: 0.46 ng/mL (ref 0.10–4.00)

## 2015-03-09 ENCOUNTER — Ambulatory Visit (INDEPENDENT_AMBULATORY_CARE_PROVIDER_SITE_OTHER): Payer: BC Managed Care – PPO | Admitting: Internal Medicine

## 2015-03-09 ENCOUNTER — Encounter: Payer: Self-pay | Admitting: Internal Medicine

## 2015-03-09 VITALS — BP 126/78 | HR 70 | Temp 98.0°F | Ht 69.25 in | Wt 186.0 lb

## 2015-03-09 DIAGNOSIS — I10 Essential (primary) hypertension: Secondary | ICD-10-CM | POA: Diagnosis not present

## 2015-03-09 DIAGNOSIS — M7522 Bicipital tendinitis, left shoulder: Secondary | ICD-10-CM

## 2015-03-09 DIAGNOSIS — G4733 Obstructive sleep apnea (adult) (pediatric): Secondary | ICD-10-CM | POA: Diagnosis not present

## 2015-03-09 DIAGNOSIS — Z0000 Encounter for general adult medical examination without abnormal findings: Secondary | ICD-10-CM | POA: Diagnosis not present

## 2015-03-09 NOTE — Assessment & Plan Note (Signed)
BP Readings from Last 3 Encounters:  03/09/15 126/78  05/05/14 128/70  03/05/14 138/78   Good control No change needed

## 2015-03-09 NOTE — Progress Notes (Signed)
Subjective:    Patient ID: Dakota Branch, male    DOB: September 19, 1950, 65 y.o.   MRN: [id number]  HPI Here for physical  Still having some pain in right shoulder Goes back to 2010 Wants to get more definitive answer --discussed seeing ortho  Notices redness in medial conjunctiva at times No change in vision No pain No discharge Keeps up with eye exam  General itchiness at times Not sure if seasonal Uses zest soap--discussed moisturizing soap (though Dove made him itchy) No rash  Not checking BP himself any more  Current Outpatient Prescriptions on File Prior to Visit  Medication Sig Dispense Refill  . amLODipine (NORVASC) 5 MG tablet TAKE ONE TABLET BY MOUTH ONCE DAILY 90 tablet 3  . sildenafil (REVATIO) 20 MG tablet TAKE 3-5 TABLETS(60-100 MG) BY MOUTH DAILY AS NEEDED 50 tablet 11   No current facility-administered medications on file prior to visit.    Allergies  Allergen Reactions  . Aspirin     REACTION: toxic reaction as a child--but this is unclear  . Lisinopril-Hydrochlorothiazide     REACTION: itching    Past Medical History  Diagnosis Date  . Colon polyps   . GERD (gastroesophageal reflux disease)   . Hypertension   . OSA (obstructive sleep apnea)   . ED (erectile dysfunction)   . Hyperlipidemia     Past Surgical History  Procedure Laterality Date  . Hepatitis ( prob a)  1978    Family History  Problem Relation Age of Onset  . Hydrocephalus Mother     shunt  . Arthritis Mother   . Hypertension Father   . Heart attack Maternal Grandmother   . Diabetes Maternal Aunt   . Colon cancer Neg Hx   . Esophageal cancer Neg Hx   . Rectal cancer Neg Hx   . Stomach cancer Neg Hx     Social History   Social History  . Marital Status: Married    Spouse Name: N/A  . Number of Children: 4  . Years of Education: N/A   Occupational History  . owner     [employer]   Social History Main Topics  . Smoking status: Former Smoker    Types:  Cigarettes  . Smokeless tobacco: Never Used     Comment: Never everyday smoker, "bummed" when in college  . Alcohol Use: Yes     Comment: rare  . Drug Use: No  . Sexual Activity: Yes    Birth Control/ Protection: Abstinence   Other Topics Concern  . Not on file   Social History Narrative   Pt is a Teacher, early years/pre.   Review of Systems  Constitutional: Negative for fatigue and unexpected weight change.       Wears seat belt  HENT: Negative for dental problem, hearing loss and tinnitus.        Keeps up with dentist  Eyes: Positive for redness. Negative for visual disturbance.  Respiratory: Negative for cough, chest tightness and shortness of breath.   Cardiovascular: Negative for chest pain, palpitations and leg swelling.  Gastrointestinal: Negative for nausea, abdominal pain, constipation and blood in stool.       No heartburn   Endocrine: Negative for polydipsia and polyuria.  Genitourinary: Negative for dysuria and urgency.       No sexual problems  Musculoskeletal: Negative for back pain.       No other problems other than shoulder  Skin: Negative for rash.       No  suspicious lesions  Allergic/Immunologic: Negative for environmental allergies and immunocompromised state.  Neurological: Negative for dizziness, syncope, weakness, light-headedness and headaches.  Hematological: Negative for adenopathy. Does not bruise/bleed easily.  Psychiatric/Behavioral: Negative for sleep disturbance and dysphoric mood. The patient is not nervous/anxious.        Objective:   Physical Exam  Constitutional: He is oriented to person, place, and time. He appears well-developed and well-nourished. No distress.  HENT:  Head: Normocephalic and atraumatic.  Right Ear: External ear normal.  Left Ear: External ear normal.  Mouth/Throat: Oropharynx is clear and moist. No oropharyngeal exudate.  Eyes: Conjunctivae are normal. Pupils are equal, round, and reactive to light.  Neck:  Normal range of motion. Neck supple. No thyromegaly present.  Cardiovascular: Normal rate, regular rhythm, normal heart sounds and intact distal pulses.  Exam reveals no gallop.   No murmur heard. Pulmonary/Chest: Effort normal and breath sounds normal. No respiratory distress. He has no wheezes. He has no rales.  Abdominal: Soft. There is no tenderness.  Musculoskeletal: He exhibits no edema or tenderness.  Lymphadenopathy:    He has no cervical adenopathy.  Neurological: He is alert and oriented to person, place, and time.  Skin: No rash noted. No erythema.  Psychiatric: He has a normal mood and affect. His behavior is normal.          Assessment & Plan:

## 2015-03-09 NOTE — Assessment & Plan Note (Signed)
And probably some degree of internal derangement Will set up ortho eval

## 2015-03-09 NOTE — Progress Notes (Signed)
Pre visit review using our clinic review tool, if applicable. No additional management support is needed unless otherwise documented below in the visit note. 

## 2015-03-09 NOTE — Addendum Note (Signed)
Addended by: Viviana Simpler I on: 03/09/2015 11:09 AM   Modules accepted: Orders

## 2015-03-09 NOTE — Assessment & Plan Note (Signed)
Uses CPAP intermittently Doesn't notice daytime somnolence regardless

## 2015-03-09 NOTE — Assessment & Plan Note (Signed)
PSA normal Colon due 2023 Recommended yearly flu--he will consider Not sure about zostavax

## 2015-04-20 ENCOUNTER — Ambulatory Visit (INDEPENDENT_AMBULATORY_CARE_PROVIDER_SITE_OTHER): Payer: BC Managed Care – PPO | Admitting: Family Medicine

## 2015-04-20 ENCOUNTER — Encounter: Payer: Self-pay | Admitting: Family Medicine

## 2015-04-20 VITALS — BP 128/78 | HR 82 | Temp 98.4°F | Ht 69.25 in | Wt 188.6 lb

## 2015-04-20 DIAGNOSIS — L309 Dermatitis, unspecified: Secondary | ICD-10-CM

## 2015-04-20 MED ORDER — TRIAMCINOLONE ACETONIDE 0.5 % EX OINT: 1.0000 "application " | TOPICAL_OINTMENT | Freq: Two times a day (BID) | CUTANEOUS | Status: DC

## 2015-04-20 NOTE — Patient Instructions (Addendum)
Please use a topical cream that we sent to your pharmacy 1-2 times daily on the areas that are currently itchy and bumpy. Do not use this on the face for longer than 2-3 days. Please be very cautious when you are doing yard work to avoid exposure to poison ivy. Follow-up with your doctor if you have persistent or worsening symptoms.

## 2015-04-20 NOTE — Progress Notes (Signed)
Pre visit review using our clinic review tool, if applicable. No additional management support is needed unless otherwise documented below in the visit note. 

## 2015-04-20 NOTE — Progress Notes (Signed)
HPI:  Dakota Branch is a pleasant 65 year old here for an acute visit for a rash. He has been out in his yard and has done yard work in the last several weeks. Has had several patches of itchy vesicular papular lesions over the course of the last 10 days. 1 history keep patch on the left ankle, one on the right shoulder and one on the right side of his neck that is now resolved. History of allergy to poison ivy. Denies rash on the eyes or mucous membranes, diffuse rash, trouble breathing or wheezing.  ROS: See pertinent positives and negatives per HPI.  Past Medical History  Diagnosis Date  . Colon polyps   . GERD (gastroesophageal reflux disease)   . Hypertension   . OSA (obstructive sleep apnea)   . ED (erectile dysfunction)   . Hyperlipidemia     Past Surgical History  Procedure Laterality Date  . Hepatitis ( prob a)  1978    Family History  Problem Relation Age of Onset  . Hydrocephalus Mother     shunt  . Arthritis Mother   . Hypertension Father   . Heart attack Maternal Grandmother   . Diabetes Maternal Aunt   . Colon cancer Neg Hx   . Esophageal cancer Neg Hx   . Rectal cancer Neg Hx   . Stomach cancer Neg Hx     Social History   Social History  . Marital Status: Married    Spouse Name: N/A  . Number of Children: 4  . Years of Education: N/A   Occupational History  . owner     [employer]   Social History Main Topics  . Smoking status: Former Smoker    Types: Cigarettes  . Smokeless tobacco: Never Used     Comment: Never everyday smoker, "bummed" when in college  . Alcohol Use: Yes     Comment: rare  . Drug Use: No  . Sexual Activity: Yes    Birth Control/ Protection: Abstinence   Other Topics Concern  . None   Social History Narrative   Pt is a Teacher, early years/pre.     Current outpatient prescriptions:  .  amLODipine (NORVASC) 5 MG tablet, TAKE ONE TABLET BY MOUTH ONCE DAILY, Disp: 90 tablet, Rfl: 3 .  sildenafil (REVATIO) 20 MG  tablet, TAKE 3-5 TABLETS(60-100 MG) BY MOUTH DAILY AS NEEDED, Disp: 50 tablet, Rfl: 11 .  triamcinolone ointment (KENALOG) 0.5 %, Apply 1 application topically 2 (two) times daily., Disp: 30 g, Rfl: 0  EXAM:  Filed Vitals:   04/20/15 1113  BP: 128/78  Pulse: 82  Temp: 98.4 F (36.9 C)    Body mass index is 27.65 kg/(m^2).  GENERAL: vitals reviewed and listed above, alert, oriented, appears well hydrated and in no acute distress  HEENT: atraumatic, conjunttiva clear, no obvious abnormalities on inspection of external nose and ears  NECK: no obvious masses on inspection  SKIN: Small streaky patch of papular vesicular lesions on the left ankle, very small similar patch on the right shoulder, no rash on the neck or face  MS: moves all extremities without noticeable abnormality  PSYCH: pleasant and cooperative, no obvious depression or anxiety  ASSESSMENT AND PLAN:  Discussed the following assessment and plan:  Dermatitis  -Given history and exam, suspect contact dermatitis, 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 Do not use this on the face for longer than 2-3 days. Please be very cautious when you are doing yard work to avoid exposure to poison ivy. Follow-up with your doctor if you have persistent or worsening symptoms.      Colin Benton R.

## 2015-04-23 ENCOUNTER — Ambulatory Visit: Payer: BC Managed Care – PPO | Admitting: Internal Medicine

## 2015-05-03 ENCOUNTER — Ambulatory Visit (INDEPENDENT_AMBULATORY_CARE_PROVIDER_SITE_OTHER): Payer: BC Managed Care – PPO | Admitting: Internal Medicine

## 2015-05-03 ENCOUNTER — Encounter: Payer: Self-pay | Admitting: Internal Medicine

## 2015-05-03 VITALS — BP 104/80 | HR 66 | Temp 97.6°F | Wt 187.0 lb

## 2015-05-03 DIAGNOSIS — R21 Rash and other nonspecific skin eruption: Secondary | ICD-10-CM | POA: Diagnosis not present

## 2015-05-03 DIAGNOSIS — L298 Other pruritus: Secondary | ICD-10-CM | POA: Insufficient documentation

## 2015-05-03 MED ORDER — KETOCONAZOLE 2 % EX CREA: 1.0000 "application " | TOPICAL_CREAM | Freq: Two times a day (BID) | CUTANEOUS | Status: DC

## 2015-05-03 NOTE — Patient Instructions (Signed)
Please try the new cream (ketoconazole). If that doesn't work, I would recommend a dermatologist

## 2015-05-03 NOTE — Progress Notes (Signed)
   Subjective:    Patient ID: Dakota Branch, male    DOB: 03-Aug-1950, 65 y.o.   MRN: [id number]  HPI Here due to persistent rash  Seemed to dry out but now more prominent Had "bubbles" Tried baking soda 3 days ago-- raised areas persisted but redness improved Steroid cream not helpful  Has a couple of other isolated red spots---left calf, right arm, right calf Had worked in the yard before this came out---but wears long pants and high boots  Current Outpatient Prescriptions on File Prior to Visit  Medication Sig Dispense Refill  . amLODipine (NORVASC) 5 MG tablet TAKE ONE TABLET BY MOUTH ONCE DAILY 90 tablet 3  . sildenafil (REVATIO) 20 MG tablet TAKE 3-5 TABLETS(60-100 MG) BY MOUTH DAILY AS NEEDED 50 tablet 11  . triamcinolone ointment (KENALOG) 0.5 % Apply 1 application topically 2 (two) times daily. 30 g 0   No current facility-administered medications on file prior to visit.    Allergies  Allergen Reactions  . Aspirin     REACTION: toxic reaction as a child--but this is unclear  . Lisinopril-Hydrochlorothiazide     REACTION: itching    Past Medical History  Diagnosis Date  . Colon polyps   . GERD (gastroesophageal reflux disease)   . Hypertension   . OSA (obstructive sleep apnea)   . ED (erectile dysfunction)   . Hyperlipidemia     Past Surgical History  Procedure Laterality Date  . Hepatitis ( prob a)  1978    Family History  Problem Relation Age of Onset  . Hydrocephalus Mother     shunt  . Arthritis Mother   . Hypertension Father   . Heart attack Maternal Grandmother   . Diabetes Maternal Aunt   . Colon cancer Neg Hx   . Esophageal cancer Neg Hx   . Rectal cancer Neg Hx   . Stomach cancer Neg Hx     Social History   Social History  . Marital Status: Married    Spouse Name: N/A  . Number of Children: 4  . Years of Education: N/A   Occupational History  . owner     [employer]   Social History Main Topics  . Smoking status: Former  Smoker    Types: Cigarettes  . Smokeless tobacco: Never Used     Comment: Never everyday smoker, "bummed" when in college  . Alcohol Use: Yes     Comment: rare  . Drug Use: No  . Sexual Activity: Yes    Birth Control/ Protection: Abstinence   Other Topics Concern  . Not on file   Social History Narrative   Pt is a Teacher, early years/pre.   Review of Systems No illness No fever    Objective:   Physical Exam  Skin:  Irregular raised faint red lesion on medial left ankle Some brownish areas inferiorly  Isolated pinpoint red papules in other areas          Assessment & Plan:

## 2015-05-03 NOTE — Progress Notes (Signed)
Pre visit review using our clinic review tool, if applicable. No additional management support is needed unless otherwise documented below in the visit note. 

## 2015-05-03 NOTE — Assessment & Plan Note (Signed)
Not typical for contact dermatitis and didn't respond to kenalog Discussed that it doesn't look like a food allergy or systemic disease Will try ketoconazole cream Needs to see derm if persist

## 2015-09-11 ENCOUNTER — Other Ambulatory Visit: Payer: Self-pay | Admitting: Internal Medicine

## 2015-09-14 ENCOUNTER — Other Ambulatory Visit: Payer: Self-pay | Admitting: Internal Medicine

## 2015-09-16 ENCOUNTER — Other Ambulatory Visit: Payer: Self-pay | Admitting: Internal Medicine

## 2015-09-16 ENCOUNTER — Telehealth: Payer: Self-pay

## 2015-09-16 NOTE — Telephone Encounter (Signed)
Spoke to pt. Advised him I had spoke to [place] and he has a Plan Exclusion for ED Meds. They wil not cover them at all. He will have to pay out of pocket for his medication. I advised him the sildenafil is the cheapest of the meds. He said he will pay out of pocket.

## 2015-12-16 ENCOUNTER — Ambulatory Visit: Payer: BC Managed Care – PPO | Admitting: Nurse Practitioner

## 2015-12-16 ENCOUNTER — Ambulatory Visit: Payer: BC Managed Care – PPO | Admitting: Internal Medicine

## 2015-12-16 ENCOUNTER — Other Ambulatory Visit: Payer: Self-pay | Admitting: Internal Medicine

## 2015-12-16 ENCOUNTER — Ambulatory Visit (INDEPENDENT_AMBULATORY_CARE_PROVIDER_SITE_OTHER): Payer: BC Managed Care – PPO | Admitting: Internal Medicine

## 2015-12-16 ENCOUNTER — Encounter: Payer: Self-pay | Admitting: Internal Medicine

## 2015-12-16 VITALS — BP 128/78 | HR 77 | Resp 20 | Wt 195.0 lb

## 2015-12-16 DIAGNOSIS — I10 Essential (primary) hypertension: Secondary | ICD-10-CM | POA: Diagnosis not present

## 2015-12-16 DIAGNOSIS — M79662 Pain in left lower leg: Secondary | ICD-10-CM | POA: Insufficient documentation

## 2015-12-16 NOTE — Assessment & Plan Note (Signed)
stable overall by history and exam, recent data reviewed with pt, and pt to continue medical treatment as before,  to f/u any worsening symptoms or concerns BP Readings from Last 3 Encounters:  12/16/15 128/78  05/03/15 104/80  04/20/15 128/78

## 2015-12-16 NOTE — Patient Instructions (Signed)
Please continue all other medications as before, and refills have been done if requested.  Please have the pharmacy call with any other refills you may need.  Please keep your appointments with your specialists as you may have planned  You will be contacted regarding the referral for: Dr Tamala Julian [facility] for further evaluation and treatment

## 2015-12-16 NOTE — Assessment & Plan Note (Signed)
Exam c/w gastroc tear, declines pain control med, for referral Dr Smith/[facility] for further eval and tx

## 2015-12-16 NOTE — Progress Notes (Signed)
   Subjective:    Patient ID: Dakota Branch, male    DOB: Jun 01, 1950, 65 y.o.   MRN: [id number]  HPI  Here to f/u after acute onset x 2 days pain and marked mod to severe swelling and sharp localized to the left calf, sudden onset while running up a small incline outside of his home trying to get back home quickly when is not trained runner, assoc with a pop to the area involved.  Worse to walk now and limps somewhat, nothing makes better.  No LBP or radicular symptoms.  No hx of DVT and Pt denies chest pain, increased sob or doe, wheezing, orthopnea, PND, increased LE swelling, palpitations, dizziness or syncope. Past Medical History:  Diagnosis Date  . Colon polyps   . ED (erectile dysfunction)   . GERD (gastroesophageal reflux disease)   . Hyperlipidemia   . Hypertension   . OSA (obstructive sleep apnea)    Past Surgical History:  Procedure Laterality Date  . hepatitis ( prob A)  1978    reports that he has quit smoking. His smoking use included Cigarettes. He has never used smokeless tobacco. He reports that he drinks alcohol. He reports that he does not use drugs. family history includes Arthritis in his mother; Diabetes in his maternal aunt; Heart attack in his maternal grandmother; Hydrocephalus in his mother; Hypertension in his father. Allergies  Allergen Reactions  . Aspirin     REACTION: toxic reaction as a child--but this is unclear  . Lisinopril-Hydrochlorothiazide     REACTION: itching   Current Outpatient Prescriptions on File Prior to Visit  Medication Sig Dispense Refill  . ketoconazole (NIZORAL) 2 % cream Apply 1 application topically 2 (two) times daily. 60 g 1  . sildenafil (REVATIO) 20 MG tablet TAKE 3-5 TABLETS BY MOUTH DAILY AS NEEDED 10 tablet 5   No current facility-administered medications on file prior to visit.    Review of Systems All otherwise neg per pt    Objective:   Physical Exam BP 128/78   Pulse 77   Resp 20   Wt 195 lb (88.5 kg)   SpO2  98%   BMI 28.59 kg/m  VS noted,  Constitutional: Pt appears in no apparent distress HENT: Head: NCAT.  Right Ear: External ear normal.  Left Ear: External ear normal.  Eyes: . Pupils are equal, round, and reactive to light. Conjunctivae and EOM are normal Neck: Normal range of motion. Neck supple.  Cardiovascular: Normal rate and regular rhythm.   Pulmonary/Chest: Effort normal and breath sounds without rales or wheezing.  Abd:  Soft, NT, ND, + BS Neurological: Pt is alert. Not confused , motor grossly intact Skin: Skin is warm. No rash, no LE edema Psychiatric: Pt behavior is normal. No agitation.  Left leg with 2+ nondiscrete but still localized tender swelling to mid post gastroc area, o/w neurovasc intact, + homans, knee without effusion, tender and has FROM    Assessment & Plan:

## 2015-12-16 NOTE — Progress Notes (Signed)
Pre visit review using our clinic review tool, if applicable. No additional management support is needed unless otherwise documented below in the visit note. 

## 2016-01-06 NOTE — Progress Notes (Deleted)
[facility] [name], [place] [address] Phone: [phone] Subjective:    I'm seeing this patient by the request  of: Dr. Jenny Reichmann MD  CC: Left leg pain  [id number]  Dakota Branch is a 66 y.o. male coming in with complaint of left leg pain. Patient was seen by another provider in one month ago. States that there was a sudden onset while running up a small incline. Patient states that there was an associated pop at that time. Had been walking with a limp. Denies any back pain. Denies any fevers chills or any abnormal weight loss. Seem to have a gastrocnemius tear. Was referred here for further evaluation. This was 3 weeks ago. Patient states    Past Medical History:  Diagnosis Date  . Colon polyps   . ED (erectile dysfunction)   . GERD (gastroesophageal reflux disease)   . Hyperlipidemia   . Hypertension   . OSA (obstructive sleep apnea)    Past Surgical History:  Procedure Laterality Date  . hepatitis ( prob A)  1978   Social History   Social History  . Marital status: Married    Spouse name: N/A  . Number of children: 4  . Years of education: N/A   Occupational History  . owner     [employer]   Social History Main Topics  . Smoking status: Former Smoker    Types: Cigarettes  . Smokeless tobacco: Never Used     Comment: Never everyday smoker, "bummed" when in college  . Alcohol use Yes     Comment: rare  . Drug use: No  . Sexual activity: Yes    Birth control/ protection: Abstinence   Other Topics Concern  . Not on file   Social History Narrative   Pt is a Teacher, early years/pre.   Allergies  Allergen Reactions  . Aspirin     REACTION: toxic reaction as a child--but this is unclear  . Lisinopril-Hydrochlorothiazide     REACTION: itching   Family History  Problem Relation Age of Onset  . Hydrocephalus Mother     shunt  . Arthritis Mother   . Hypertension Father   . Heart attack Maternal Grandmother    . Diabetes Maternal Aunt   . Colon cancer Neg Hx   . Esophageal cancer Neg Hx   . Rectal cancer Neg Hx   . Stomach cancer Neg Hx     Past medical history, social, surgical and family history all reviewed in electronic medical record.  No pertanent information unless stated regarding to the chief complaint.   Review of Systems:Review of systems updated and as accurate as of 01/06/16  No headache, visual changes, nausea, vomiting, diarrhea, constipation, dizziness, abdominal pain, skin rash, fevers, chills, night sweats, weight loss, swollen lymph nodes, body aches, joint swelling, muscle aches, chest pain, shortness of breath, mood changes.   Objective  There were no vitals taken for this visit. Systems examined below as of 01/06/16   General: No apparent distress alert and oriented x3 mood and affect normal, dressed appropriately.  HEENT: Pupils equal, extraocular movements intact  Respiratory: Patient's speak in full sentences and does not appear short of breath  Cardiovascular: No lower extremity edema, non tender, no erythema  Skin: Warm dry intact with no signs of infection or rash on extremities or on axial skeleton.  Abdomen: Soft nontender  Neuro: Cranial nerves II through XII are intact, neurovascularly intact in all extremities with 2+ DTRs and  2+ pulses.  Lymph: No lymphadenopathy of posterior or anterior cervical chain or axillae bilaterally.  Gait normal with good balance and coordination.  MSK:  Non tender with full range of motion and good stability and symmetric strength and tone of shoulders, elbows, wrist, hip, knee and ankles bilaterally.     Impression and Recommendations:     This case required medical decision making of moderate complexity.      Note: This dictation was prepared with Dragon dictation along with smaller phrase technology. Any transcriptional errors that result from this process are unintentional.

## 2016-01-07 ENCOUNTER — Ambulatory Visit: Payer: BC Managed Care – PPO | Admitting: Family Medicine

## 2016-02-22 ENCOUNTER — Other Ambulatory Visit: Payer: Self-pay | Admitting: Internal Medicine

## 2016-02-24 ENCOUNTER — Ambulatory Visit (INDEPENDENT_AMBULATORY_CARE_PROVIDER_SITE_OTHER): Payer: BC Managed Care – PPO | Admitting: Internal Medicine

## 2016-02-24 ENCOUNTER — Encounter: Payer: Self-pay | Admitting: Internal Medicine

## 2016-02-24 VITALS — BP 110/82 | HR 69 | Temp 97.9°F | Wt 196.0 lb

## 2016-02-24 DIAGNOSIS — M25511 Pain in right shoulder: Secondary | ICD-10-CM | POA: Diagnosis not present

## 2016-02-24 DIAGNOSIS — H1132 Conjunctival hemorrhage, left eye: Secondary | ICD-10-CM

## 2016-02-24 DIAGNOSIS — G8929 Other chronic pain: Secondary | ICD-10-CM | POA: Diagnosis not present

## 2016-02-24 NOTE — Assessment & Plan Note (Signed)
Discussed the relationship of lifting heavy objects to this To ophtho if doesn't resolve

## 2016-02-24 NOTE — Assessment & Plan Note (Signed)
Interested in further evaluation [place]---will set up with Dr Tamala Julian

## 2016-02-24 NOTE — Progress Notes (Signed)
Pre visit review using our clinic review tool, if applicable. No additional management support is needed unless otherwise documented below in the visit note. 

## 2016-02-24 NOTE — Progress Notes (Signed)
   Subjective:    Patient ID: Dakota [facility], male    DOB: 19-Aug-1950, 66 y.o.   MRN: [id number]  HPI Here due to eye swelling  2 months ago he noted blood spot on lower medial eye (conjunctiva) It then spread Slight pain Finally went away  Recurred 2-3 days ago No pain or very mild symptoms Some tenderness over the left eyebrow  No trauma No cough, sneeze or hard straining moving bowels  Current Outpatient Prescriptions on File Prior to Visit  Medication Sig Dispense Refill  . amLODipine (NORVASC) 5 MG tablet TAKE ONE TABLET BY MOUTH ONCE DAILY 90 tablet 0  . sildenafil (REVATIO) 20 MG tablet TAKE 3-5 TABLETS BY MOUTH DAILY AS NEEDED 10 tablet 5   No current facility-administered medications on file prior to visit.     Allergies  Allergen Reactions  . Aspirin     REACTION: toxic reaction as a child--but this is unclear  . Lisinopril-Hydrochlorothiazide     REACTION: itching    Past Medical History:  Diagnosis Date  . Colon polyps   . ED (erectile dysfunction)   . GERD (gastroesophageal reflux disease)   . Hyperlipidemia   . Hypertension   . OSA (obstructive sleep apnea)     Past Surgical History:  Procedure Laterality Date  . hepatitis ( prob A)  1978    Family History  Problem Relation Age of Onset  . Hydrocephalus Mother     shunt  . Arthritis Mother   . Hypertension Father   . Heart attack Maternal Grandmother   . Diabetes Maternal Aunt   . Colon cancer Neg Hx   . Esophageal cancer Neg Hx   . Rectal cancer Neg Hx   . Stomach cancer Neg Hx     Social History   Social History  . Marital status: Married    Spouse name: N/A  . Number of children: 4  . Years of education: N/A   Occupational History  . owner     [employer]   Social History Main Topics  . Smoking status: Former Smoker    Types: Cigarettes  . Smokeless tobacco: Never Used     Comment: Never everyday smoker, "bummed" when in college  . Alcohol use Yes     Comment: rare    . Drug use: No  . Sexual activity: Yes    Birth control/ protection: Abstinence   Other Topics Concern  . Not on file   Social History Narrative   Pt is a Teacher, early years/pre.   Review of Systems Vision is stable---is due to have cataract surgery No tearing in eyes Eyelids are not involved No illness or fever Does lift heavy objects at times Ongoing problems with right shoulder--interested in sports medicine    Objective:   Physical Exam  Eyes:  Conjunctival hemorrhage in medial left eye Fairly deep with dark blood spot in middle No foreign body          Assessment & Plan:

## 2016-02-25 ENCOUNTER — Telehealth: Payer: Self-pay

## 2016-02-25 NOTE — Telephone Encounter (Signed)
Pt request refill amlodipine; [facility] is not open yet pt will ck with pharmacy today; refill should be available. Pt voiced understanding,

## 2016-02-26 ENCOUNTER — Other Ambulatory Visit: Payer: Self-pay | Admitting: Internal Medicine

## 2016-02-26 DIAGNOSIS — I10 Essential (primary) hypertension: Secondary | ICD-10-CM

## 2016-03-07 ENCOUNTER — Other Ambulatory Visit: Payer: BC Managed Care – PPO

## 2016-03-07 NOTE — Progress Notes (Signed)
[facility] [place] [facility] [place] [address] Phone: [phone] Subjective:    I'm seeing this patient by the request  of:  Viviana Simpler, MD   CC: Right shoulder pain  [id number]  Dakota Branch is a 66 y.o. male coming in with complaint of right shoulder pain. Patient sates that he has had this chronically for quite some time. Patient states years. Patient does not rib number any specific injury. 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 Patient denies any radiation with certain movements causes severe amount of pain. Seems to be mostly on the anterior aspect of the shoulder. States that maybe some weakness compared to contralateral side. Patient does not want any intervention but wants to see how he can improve it as well as keep it from worsening. Does not like taking over-the-counter medications if possible.     Past Medical History:  Diagnosis Date  . Colon polyps   . ED (erectile dysfunction)   . GERD (gastroesophageal reflux disease)   . Hyperlipidemia   . Hypertension   . OSA (obstructive sleep apnea)    Past Surgical History:  Procedure Laterality Date  . hepatitis ( prob A)  1978   Social History   Social History  . Marital status: Married    Spouse name: N/A  . Number of children: 4  . Years of education: N/A   Occupational History  . owner     [employer]   Social History Main Topics  . Smoking status: Former Smoker    Types: Cigarettes  . Smokeless tobacco: Never Used     Comment: Never everyday smoker, "bummed" when in college  . Alcohol use Yes     Comment: rare  . Drug use: No  . Sexual activity: Yes    Birth control/ protection: Abstinence   Other Topics Concern  . None   Social History Narrative   Pt is a Teacher, early years/pre.   Allergies  Allergen Reactions  . Aspirin     REACTION: toxic reaction as a child--but this is unclear  .  Lisinopril-Hydrochlorothiazide     REACTION: itching   Family History  Problem Relation Age of Onset  . Hydrocephalus Mother     shunt  . Arthritis Mother   . Hypertension Father   . Heart attack Maternal Grandmother   . Diabetes Maternal Aunt   . Colon cancer Neg Hx   . Esophageal cancer Neg Hx   . Rectal cancer Neg Hx   . Stomach cancer Neg Hx     Past medical history, social, surgical and family history all reviewed in electronic medical record.  No pertanent information unless stated regarding to the chief complaint.   Review of Systems:Review of systems updated and as accurate as of 03/08/16  No headache, visual changes, nausea, vomiting, diarrhea, constipation, dizziness, abdominal pain, skin rash, fevers, chills, night sweats, weight loss, swollen lymph nodes, body aches, joint swelling,  chest pain, shortness of breath, mood changes.  Positive muscle aches  Objective  Blood pressure 130/84, pulse 62, height 5' 9.25" (1.759 m), weight 194 lb (88 kg), SpO2 98 %. Systems examined below as of 03/08/16   General: No apparent distress alert and oriented x3 mood and affect normal, dressed appropriately.  HEENT: Pupils equal, extraocular movements intact  Respiratory: Patient's speak in full sentences and does not appear short of breath  Cardiovascular: No lower extremity edema, non  tender, no erythema  Skin: Warm dry intact with no signs of infection or rash on extremities or on axial skeleton.  Abdomen: Soft nontender  Neuro: Cranial nerves II through XII are intact, neurovascularly intact in all extremities with 2+ DTRs and 2+ pulses.  Lymph: No lymphadenopathy of posterior or anterior cervical chain or axillae bilaterally.  Gait normal with good balance and coordination.  MSK:  Non tender with full range of motion and good stability and symmetric strength and tone of elbows, wrist, hip, knee and ankles bilaterally. Mild arthritic changes of other joints.  Shoulder:  Right Severe angling in hypertrophia of the right acromial clavicular joint compared to contralateral side Mild tenderness over the anterior aspect of the shoulder in the acromial clavicular joint Patient does have some limitation in range of motion lacking the last 10 of external rotation in the last 5 of flexion. Crepitus noted with range of motion Rotator cuff strength 4 out of 5 compared to contralateral sign signs of impingement with positive Neer and Hawkin's tests, but negative empty can sign. Speeds and Yergason's tests positive. Positive O'Brien's and positive crossover Normal scapular function observed. No painful arc and no drop arm sign. No apprehension sign  MSK US performed of: Right This study was ordered, performed, and interpreted by Charlann Boxer D.O.  Shoulder:   Supraspinatus:  Mild degenerative changes noted with degenerative bone changes Bursal bulge seen with shoulder abduction on impingement view. Subscapularis:  Degenerative tearing noted. Seems to be near full-thickness [facility] joint:  Severe bony normality noted likely arthritis with posttraumatic changes Glenohumeral Joint:  Moderate arthritis noted Glenoid Labrum:  Intact without visualized tears. Biceps Tendon:  Hypoechoic changes noted of the tendon sheath but no true tear. Should.  Impression: Degenerative rotator cuff arthropathy   Procedure note [id number]; 15 minutes spent for Therapeutic exercises as stated in above notes.  This included exercises focusing on stretching, strengthening, with significant focus on eccentric aspects.  Shoulder Exercises that included:  Basic scapular stabilization to include adduction and depression of scapula Scaption, focusing on proper movement and good control Internal and External rotation utilizing a theraband, with elbow tucked at side entire time Rows with theraband   Proper technique shown and discussed handout in great detail with ATC.  All questions were discussed and  answered.     Impression and Recommendations:     This case required medical decision making of moderate complexity.      Note: This dictation was prepared with Dragon dictation along with smaller phrase technology. Any transcriptional errors that result from this process are unintentional.

## 2016-03-08 ENCOUNTER — Ambulatory Visit (INDEPENDENT_AMBULATORY_CARE_PROVIDER_SITE_OTHER): Payer: BC Managed Care – PPO | Admitting: Family Medicine

## 2016-03-08 ENCOUNTER — Ambulatory Visit: Payer: Self-pay

## 2016-03-08 ENCOUNTER — Ambulatory Visit (INDEPENDENT_AMBULATORY_CARE_PROVIDER_SITE_OTHER)
Admission: RE | Admit: 2016-03-08 | Discharge: 2016-03-08 | Disposition: A | Discharge status: Home / Self Care | Payer: BC Managed Care – PPO | Source: Ambulatory Visit | Attending: Family Medicine | Admitting: Family Medicine

## 2016-03-08 ENCOUNTER — Encounter: Payer: Self-pay | Admitting: Family Medicine

## 2016-03-08 VITALS — BP 130/84 | HR 62 | Ht 69.25 in | Wt 194.0 lb

## 2016-03-08 DIAGNOSIS — G8929 Other chronic pain: Secondary | ICD-10-CM

## 2016-03-08 DIAGNOSIS — M7522 Bicipital tendinitis, left shoulder: Secondary | ICD-10-CM | POA: Diagnosis not present

## 2016-03-08 DIAGNOSIS — M25511 Pain in right shoulder: Secondary | ICD-10-CM

## 2016-03-08 MED ORDER — VITAMIN D (ERGOCALCIFEROL) 1.25 MG (50000 UNIT) PO CAPS: 50000.0000 [IU] | ORAL_CAPSULE | ORAL | 0 refills | Status: DC

## 2016-03-08 MED ORDER — DICLOFENAC SODIUM 2 % TD SOLN: 2.0000 "application " | Freq: Two times a day (BID) | TRANSDERMAL | 3 refills | Status: DC

## 2016-03-08 NOTE — Assessment & Plan Note (Signed)
Differently has some biceps tendinitis noted. Patient once to avoid into them injection. Discussed topical anti-inflammatories and possible compression. Home exercises given. We'll see how patient response.

## 2016-03-08 NOTE — Assessment & Plan Note (Signed)
Patient shoulder pain seems to be somewhat of a bicep tendinitis but also has significant posttraumatic changes of the acromial clavicular joint. I do think that this is likely contributing. We discussed icing regimen and home exercises. We discussed which activities to do in which ones to potentially avoid. X-rays are pending do feel that there is going to be some arthritic changes that is contributing. Patient does have what appears to be rotator cuff tear as well as likely contributing to a lot of his pain the patient does not want any surgical intervention. We'll try conservative therapy and have patient follow-up again in 3-4 weeks.

## 2016-03-08 NOTE — Patient Instructions (Signed)
Good to see you  It looks like you have arthritis and changes to the shoulder.  We will get xray downstairs today  Exercises 3 times a week.  pennsaid pinkie amount topically 2 times daily as needed.  Once weekly vitamin D for 12 weeks.  Keep hands within peripheral vision.  See me again in 3-4 weeks.

## 2016-03-10 ENCOUNTER — Other Ambulatory Visit: Payer: BC Managed Care – PPO

## 2016-03-10 ENCOUNTER — Encounter: Payer: Self-pay | Admitting: Family Medicine

## 2016-03-10 ENCOUNTER — Encounter: Payer: BC Managed Care – PPO | Admitting: Internal Medicine

## 2016-03-14 ENCOUNTER — Encounter: Payer: BC Managed Care – PPO | Admitting: Internal Medicine

## 2016-03-17 ENCOUNTER — Encounter: Payer: BC Managed Care – PPO | Admitting: Internal Medicine

## 2016-04-03 ENCOUNTER — Other Ambulatory Visit: Payer: Self-pay | Admitting: Internal Medicine

## 2016-04-03 DIAGNOSIS — I10 Essential (primary) hypertension: Secondary | ICD-10-CM

## 2016-04-05 ENCOUNTER — Other Ambulatory Visit (INDEPENDENT_AMBULATORY_CARE_PROVIDER_SITE_OTHER): Payer: BC Managed Care – PPO

## 2016-04-05 DIAGNOSIS — Z1159 Encounter for screening for other viral diseases: Secondary | ICD-10-CM

## 2016-04-05 DIAGNOSIS — I10 Essential (primary) hypertension: Secondary | ICD-10-CM | POA: Diagnosis not present

## 2016-04-05 LAB — CBC WITH DIFFERENTIAL/PLATELET
BASOS ABS: 0 10*3/uL (ref 0.0–0.1)
Basophils Relative: 0.3 % (ref 0.0–3.0)
Eosinophils Absolute: 0.1 10*3/uL (ref 0.0–0.7)
Eosinophils Relative: 2.2 % (ref 0.0–5.0)
HEMATOCRIT: 42.4 % (ref 39.0–52.0)
HEMOGLOBIN: 14.5 g/dL (ref 13.0–17.0)
LYMPHS PCT: 27.3 % (ref 12.0–46.0)
Lymphs Abs: 1.7 10*3/uL (ref 0.7–4.0)
MCHC: 34.3 g/dL (ref 30.0–36.0)
MCV: 86.3 fl (ref 78.0–100.0)
MONOS PCT: 8.1 % (ref 3.0–12.0)
Monocytes Absolute: 0.5 10*3/uL (ref 0.1–1.0)
Neutro Abs: 3.9 10*3/uL (ref 1.4–7.7)
Neutrophils Relative %: 62.1 % (ref 43.0–77.0)
Platelets: 262 10*3/uL (ref 150.0–400.0)
RBC: 4.91 Mil/uL (ref 4.22–5.81)
RDW: 12.9 % (ref 11.5–15.5)
WBC: 6.2 10*3/uL (ref 4.0–10.5)

## 2016-04-05 LAB — LIPID PANEL
Cholesterol: 190 mg/dL (ref 0–200)
HDL: 31.6 mg/dL — AB (ref >39.00)
LDL CALC: 136 mg/dL — AB (ref 0–99)
NONHDL: 158.52
Total CHOL/HDL Ratio: 6
Triglycerides: 112 mg/dL (ref 0.0–149.0)
VLDL: 22.4 mg/dL (ref 0.0–40.0)

## 2016-04-05 LAB — COMPREHENSIVE METABOLIC PANEL
ALBUMIN: 4.4 g/dL (ref 3.5–5.2)
ALK PHOS: 57 U/L (ref 39–117)
ALT: 29 U/L (ref 0–53)
AST: 20 U/L (ref 0–37)
BILIRUBIN TOTAL: 1.4 mg/dL — AB (ref 0.2–1.2)
BUN: 12 mg/dL (ref 6–23)
CALCIUM: 9.2 mg/dL (ref 8.4–10.5)
CO2: 29 mEq/L (ref 19–32)
Chloride: 103 mEq/L (ref 96–112)
Creatinine, Ser: 0.85 mg/dL (ref 0.40–1.50)
GFR: 96.56 mL/min (ref >60.00)
GLUCOSE: 118 mg/dL — AB (ref 70–99)
POTASSIUM: 4.4 meq/L (ref 3.5–5.1)
Sodium: 138 mEq/L (ref 135–145)
Total Protein: 7.2 g/dL (ref 6.0–8.3)

## 2016-04-05 NOTE — Progress Notes (Signed)
[name] [address], [place] [address] Phone: [phone] Subjective:    I'm seeing this patient by the request  of:  Viviana Simpler, MD   CC: Right shoulder pain f/u   [id number]  [facility] is a 66 y.o. male coming in with complaint of right shoulder pain. Patient was seen previously and did have more of a rotator cuff degenerative tear as well as what appeared to be mild to moderate osteophytic changes of the glenohumeral joint and moderate changes of the acromioclavicular joint. Patient has been doing conservative therapy but states that there is no significant improvement. In certain range of motion continues to give him difficulty. Patient states that if he lifts his head above his head he seems to get significantly worse. Denies any associated neck pain. States that he does use his hands a significant amount at work and is unable to do certain things secondary to the pain.  Patient did have x-rays taken 03/08/2016 that confirmed Dean degenerative change of the acromioclavicular joint     Past Medical History:  Diagnosis Date  . Colon polyps   . ED (erectile dysfunction)   . GERD (gastroesophageal reflux disease)   . Hyperlipidemia   . Hypertension   . OSA (obstructive sleep apnea)    Past Surgical History:  Procedure Laterality Date  . hepatitis ( prob A)  1978   Social History   Social History  . Marital status: Married    Spouse name: N/A  . Number of children: 4  . Years of education: N/A   Occupational History  . owner     [employer]   Social History Main Topics  . Smoking status: Former Smoker    Types: Cigarettes  . Smokeless tobacco: Never Used     Comment: Never everyday smoker, "bummed" when in college  . Alcohol use Yes     Comment: rare  . Drug use: No  . Sexual activity: Yes    Birth control/ protection: Abstinence   Other Topics Concern  . None   Social History Narrative   Pt is a  Teacher, early years/pre.   Allergies  Allergen Reactions  . Aspirin     REACTION: toxic reaction as a child--but this is unclear  . Lisinopril-Hydrochlorothiazide     REACTION: itching   Family History  Problem Relation Age of Onset  . Hydrocephalus Mother     shunt  . Arthritis Mother   . Hypertension Father   . Heart attack Maternal Grandmother   . Diabetes Maternal Aunt   . Colon cancer Neg Hx   . Esophageal cancer Neg Hx   . Rectal cancer Neg Hx   . Stomach cancer Neg Hx     Past medical history, social, surgical and family history all reviewed in electronic medical record.  No pertanent information unless stated regarding to the chief complaint.   Review of Systems: No headache, visual changes, nausea, vomiting, diarrhea, constipation, dizziness, abdominal pain, skin rash, fevers, chills, night sweats, weight loss, swollen lymph nodes, body aches, joint swelling, muscle aches, chest pain, shortness of breath, mood changes.    Objective  Blood pressure (!) 148/86, pulse 68, resp. rate 16, weight 195 lb 6 oz (88.6 kg), SpO2 97 %.   Systems examined below as of 04/06/16 General: NAD A&O x3 mood, affect normal  HEENT: Pupils equal, extraocular movements intact no nystagmus Respiratory: not short of breath at rest or with speaking Cardiovascular: No lower extremity  edema, non tender Skin: Warm dry intact with no signs of infection or rash on extremities or on axial skeleton. Abdomen: Soft nontender, no masses Neuro: Cranial nerves  intact, neurovascularly intact in all extremities with 2+ DTRs and 2+ pulses. Lymph: No lymphadenopathy appreciated today  Gait normal with good balance and coordination.  MSK: Non tender with full range of motion and good stability and symmetric strength and tone of  elbows, wrist,  knee hips and ankles bilaterally.   Shoulder: Inspection shows bony normality of the acromioclavicular joint Pain diffusely over the shoulder Patient does have  mild limitation lacking the last 10 of forward flexion in the last 5 of external rotation. Rotator cuff strength 4 out of 5 strength Positive impingement noted. Speeds and Yergason's tests mildly positive. Positive labral pathology and positive crossover Normal scapular function observed. No painful arc and no drop arm sign. No apprehension sign Contralateral shoulder unremarkable   After informed written and verbal consent, patient was seated on exam table. Right shoulder was prepped with alcohol swab and utilizing posterior approach, patient's right glenohumeral space was injected with 4:1  marcaine 0.5%: Kenalog 40mg /dL. Patient tolerated the procedure well without immediate complications.       Impression and Recommendations:     This case required medical decision making of moderate complexity.      Note: This dictation was prepared with Dragon dictation along with smaller phrase technology. Any transcriptional errors that result from this process are unintentional.

## 2016-04-06 ENCOUNTER — Ambulatory Visit (INDEPENDENT_AMBULATORY_CARE_PROVIDER_SITE_OTHER): Payer: BC Managed Care – PPO | Admitting: Family Medicine

## 2016-04-06 ENCOUNTER — Encounter: Payer: Self-pay | Admitting: Family Medicine

## 2016-04-06 DIAGNOSIS — M75111 Incomplete rotator cuff tear or rupture of right shoulder, not specified as traumatic: Secondary | ICD-10-CM | POA: Diagnosis not present

## 2016-04-06 LAB — HEPATITIS C ANTIBODY: HCV AB: NEGATIVE

## 2016-04-06 NOTE — Patient Instructions (Addendum)
Great to see you  [facility] is your friend.  Sorry you are still hurting.  Injected the shoulder  If not better in 2 weeks call me or send me a message and we will get an MRI.  Otherwise see me again in 4-6 weeks with you continue the exercises 2-3 times a week.

## 2016-04-06 NOTE — Assessment & Plan Note (Addendum)
Worsening symptoms. Patient given injection today and tolerated the procedure well. We discussed icing regimen and home exercises. Patient will continue with the topical anti-inflammatories. We discussed continuing the other conservative therapies. If no significant improvement or worsening weakness patient will need an MRI of the shoulder.

## 2016-04-06 NOTE — Progress Notes (Signed)
Pre-visit discussion using our clinic review tool. No additional management support is needed unless otherwise documented below in the visit note.  

## 2016-04-07 ENCOUNTER — Ambulatory Visit (INDEPENDENT_AMBULATORY_CARE_PROVIDER_SITE_OTHER): Payer: BC Managed Care – PPO | Admitting: Internal Medicine

## 2016-04-07 ENCOUNTER — Encounter: Payer: Self-pay | Admitting: Internal Medicine

## 2016-04-07 DIAGNOSIS — I10 Essential (primary) hypertension: Secondary | ICD-10-CM

## 2016-04-07 DIAGNOSIS — Z0000 Encounter for general adult medical examination without abnormal findings: Secondary | ICD-10-CM

## 2016-04-07 DIAGNOSIS — R7301 Impaired fasting glucose: Secondary | ICD-10-CM | POA: Diagnosis not present

## 2016-04-07 NOTE — Assessment & Plan Note (Signed)
Generally healthy Discussed fitness and healthy eating Defer PSA to next year Colon due 2023 Recommended flu vaccine

## 2016-04-07 NOTE — Progress Notes (Signed)
Subjective:    Patient ID: Dakota Branch, male    DOB: 1950-12-14, 66 y.o.   MRN: [id number]  HPI Here for physical  Got injection in right shoulder yesterday by Dr Tamala Julian Some improved movement May need MRI  Has itchy area on lower right calf  Mild pain in upper abdomen and substernal (like over xiphoid) Started abut a week ago Notices it with pushing against the area No N/V Appetite is fine Bowels normal No recent heavy work, etc  Had spot on scrotum Will bleed at times  Discussed his labs Is member of gym but doesn't get there Discussed lifestyle  Current Outpatient Prescriptions on File Prior to Visit  Medication Sig Dispense Refill  . amLODipine (NORVASC) 5 MG tablet TAKE ONE TABLET BY MOUTH ONCE DAILY 90 tablet 0  . Diclofenac Sodium (PENNSAID) 2 % SOLN Place 2 application onto the skin 2 (two) times daily. 112 g 3  . sildenafil (REVATIO) 20 MG tablet TAKE 3-5 TABLETS BY MOUTH DAILY AS NEEDED 10 tablet 5  . Vitamin D, Ergocalciferol, (DRISDOL) 50000 units CAPS capsule Take 1 capsule (50,000 Units total) by mouth every 7 (seven) days. 12 capsule 0   No current facility-administered medications on file prior to visit.     Allergies  Allergen Reactions  . Aspirin     REACTION: toxic reaction as a child--but this is unclear  . Lisinopril-Hydrochlorothiazide     REACTION: itching    Past Medical History:  Diagnosis Date  . Colon polyps   . ED (erectile dysfunction)   . GERD (gastroesophageal reflux disease)   . Hyperlipidemia   . Hypertension   . OSA (obstructive sleep apnea)     Past Surgical History:  Procedure Laterality Date  . hepatitis ( prob A)  1978    Family History  Problem Relation Age of Onset  . Hydrocephalus Mother     shunt  . Arthritis Mother   . Hypertension Father   . Heart attack Maternal Grandmother   . Diabetes Maternal Aunt   . Colon cancer Neg Hx   . Esophageal cancer Neg Hx   . Rectal cancer Neg Hx   . Stomach cancer  Neg Hx     Social History   Social History  . Marital status: Married    Spouse name: N/A  . Number of children: 4  . Years of education: N/A   Occupational History  . owner     [employer]   Social History Main Topics  . Smoking status: Former Smoker    Types: Cigarettes  . Smokeless tobacco: Never Used     Comment: Never everyday smoker, "bummed" when in college  . Alcohol use Yes     Comment: rare  . Drug use: No  . Sexual activity: Yes    Birth control/ protection: Abstinence   Other Topics Concern  . Not on file   Social History Narrative   Pt is a Teacher, early years/pre.   Review of Systems  Constitutional: Negative for fatigue and unexpected weight change.       Wears seat belt  HENT: Negative for dental problem, hearing loss and tinnitus.        Keeps iup with dentist  Eyes: Negative for visual disturbance.       No diplopia or unilateral vision loss  Respiratory: Negative for cough, chest tightness and shortness of breath.   Cardiovascular: Negative for chest pain, palpitations and leg swelling.  Gastrointestinal: Negative for abdominal pain, blood  in stool, constipation, nausea and vomiting.  Endocrine: Negative for polydipsia and polyuria.  Genitourinary: Negative for difficulty urinating, frequency and urgency.       Uses sildenafil once in a while  Musculoskeletal: Positive for arthralgias. Negative for back pain and joint swelling.  Skin: Negative for rash.  Allergic/Immunologic: Positive for environmental allergies. Negative for immunocompromised state.       Mostly to dust  Neurological: Negative for dizziness, syncope, light-headedness and headaches.  Hematological: Negative for adenopathy. Does not bruise/bleed easily.  Psychiatric/Behavioral: Negative for dysphoric mood and sleep disturbance. The patient is not nervous/anxious.        Objective:   Physical Exam  Constitutional: He is oriented to person, place, and time. He appears  well-developed and well-nourished. No distress.  HENT:  Head: Normocephalic and atraumatic.  Right Ear: External ear normal.  Left Ear: External ear normal.  Mouth/Throat: Oropharynx is clear and moist. No oropharyngeal exudate.  Eyes: Conjunctivae are normal. Pupils are equal, round, and reactive to light.  Neck: No thyromegaly present.  Cardiovascular: Normal rate, regular rhythm, normal heart sounds and intact distal pulses.  Exam reveals no gallop.   No murmur heard. Pulmonary/Chest: Effort normal and breath sounds normal. No respiratory distress. He has no wheezes. He has no rales.  Abdominal: Soft. There is no tenderness.  Genitourinary:  Genitourinary Comments: 2 lesions of ~1-80mm on scrotum. Papules (possible wart) Another on penis (discussed not neoplastic--can treat if spreads or symptoms)  Musculoskeletal: He exhibits no edema or tenderness.  Lymphadenopathy:    He has no cervical adenopathy.  Neurological: He is alert and oriented to person, place, and time.  Skin: No rash noted. No erythema.  Psychiatric: He has a normal mood and affect. His behavior is normal.          Assessment & Plan:

## 2016-04-07 NOTE — Progress Notes (Signed)
Pre visit review using our clinic review tool, if applicable. No additional management support is needed unless otherwise documented below in the visit note. 

## 2016-04-07 NOTE — Assessment & Plan Note (Signed)
Discussed lifestyle Consider metformin if worse next year

## 2016-04-07 NOTE — Assessment & Plan Note (Signed)
BP Readings from Last 3 Encounters:  04/07/16 136/84  04/06/16 (!) 148/86  03/08/16 130/84   Borderline at times Discussed DASH diet and exercise---especially in view of elevated sugars

## 2016-04-07 NOTE — Patient Instructions (Signed)
DASH Eating Plan DASH stands for "Dietary Approaches to Stop Hypertension." The DASH eating plan is a healthy eating plan that has been shown to reduce high blood pressure (hypertension). It may also reduce your risk for type 2 diabetes, heart disease, and stroke. The DASH eating plan may also help with weight loss. What are tips for following this plan? General guidelines  Avoid eating more than 2,300 mg (milligrams) of salt (sodium) a day. If you have hypertension, you may need to reduce your sodium intake to 1,500 mg a day.  Limit alcohol intake to no more than 1 drink a day for nonpregnant women and 2 drinks a day for men. One drink equals 12 oz of beer, 5 oz of wine, or 1 oz of hard liquor.  Work with your health care provider to maintain a healthy body weight or to lose weight. Ask what an ideal weight is for you.  Get at least 30 minutes of exercise that causes your heart to beat faster (aerobic exercise) most days of the week. Activities may include walking, swimming, or biking.  Work with your health care provider or diet and nutrition specialist (dietitian) to adjust your eating plan to your individual calorie needs. Reading food labels  Check food labels for the amount of sodium per serving. Choose foods with less than 5 percent of the Daily Value of sodium. Generally, foods with less than 300 mg of sodium per serving fit into this eating plan.  To find whole grains, look for the word "whole" as the first word in the ingredient list. Shopping  Buy products labeled as "low-sodium" or "no salt added."  Buy fresh foods. Avoid canned foods and premade or frozen meals. Cooking  Avoid adding salt when cooking. Use salt-free seasonings or herbs instead of table salt or sea salt. Check with your health care provider or pharmacist before using salt substitutes.  Do not fry foods. Cook foods using healthy methods such as baking, boiling, grilling, and broiling instead.  Cook with  heart-healthy oils, such as olive, canola, soybean, or sunflower oil. Meal planning   Eat a balanced diet that includes: ? 5 or more servings of fruits and vegetables each day. At each meal, try to fill half of your plate with fruits and vegetables. ? Up to 6-8 servings of whole grains each day. ? Less than 6 oz of lean meat, poultry, or fish each day. A 3-oz serving of meat is about the same size as a deck of cards. One egg equals 1 oz. ? 2 servings of low-fat dairy each day. ? A serving of nuts, seeds, or beans 5 times each week. ? Heart-healthy fats. Healthy fats called Omega-3 fatty acids are found in foods such as flaxseeds and coldwater fish, like sardines, salmon, and mackerel.  Limit how much you eat of the following: ? Canned or prepackaged foods. ? Food that is high in trans fat, such as fried foods. ? Food that is high in saturated fat, such as fatty meat. ? Sweets, desserts, sugary drinks, and other foods with added sugar. ? Full-fat dairy products.  Do not salt foods before eating.  Try to eat at least 2 vegetarian meals each week.  Eat more home-cooked food and less restaurant, buffet, and fast food.  When eating at a restaurant, ask that your food be prepared with less salt or no salt, if possible. What foods are recommended? The items listed may not be a complete list. Talk with your dietitian about what   dietary choices are best for you. Grains Whole-grain or whole-wheat bread. Whole-grain or whole-wheat pasta. Brown rice. Oatmeal. Quinoa. Bulgur. Whole-grain and low-sodium cereals. Pita bread. Low-fat, low-sodium crackers. Whole-wheat flour tortillas. Vegetables Fresh or frozen vegetables (raw, steamed, roasted, or grilled). Low-sodium or reduced-sodium tomato and vegetable juice. Low-sodium or reduced-sodium tomato sauce and tomato paste. Low-sodium or reduced-sodium canned vegetables. Fruits All fresh, dried, or frozen fruit. Canned fruit in natural juice (without  added sugar). Meat and other protein foods Skinless chicken or turkey. Ground chicken or turkey. Pork with fat trimmed off. Fish and seafood. Egg whites. Dried beans, peas, or lentils. Unsalted nuts, nut butters, and seeds. Unsalted canned beans. Lean cuts of beef with fat trimmed off. Low-sodium, lean deli meat. Dairy Low-fat (1%) or fat-free (skim) milk. Fat-free, low-fat, or reduced-fat cheeses. Nonfat, low-sodium ricotta or cottage cheese. Low-fat or nonfat yogurt. Low-fat, low-sodium cheese. Fats and oils Soft margarine without trans fats. Vegetable oil. Low-fat, reduced-fat, or light mayonnaise and salad dressings (reduced-sodium). Canola, safflower, olive, soybean, and sunflower oils. Avocado. Seasoning and other foods Herbs. Spices. Seasoning mixes without salt. Unsalted popcorn and pretzels. Fat-free sweets. What foods are not recommended? The items listed may not be a complete list. Talk with your dietitian about what dietary choices are best for you. Grains Baked goods made with fat, such as croissants, muffins, or some breads. Dry pasta or rice meal packs. Vegetables Creamed or fried vegetables. Vegetables in a cheese sauce. Regular canned vegetables (not low-sodium or reduced-sodium). Regular canned tomato sauce and paste (not low-sodium or reduced-sodium). Regular tomato and vegetable juice (not low-sodium or reduced-sodium). Pickles. Olives. Fruits Canned fruit in a light or heavy syrup. Fried fruit. Fruit in cream or butter sauce. Meat and other protein foods Fatty cuts of meat. Ribs. Fried meat. Bacon. Sausage. Bologna and other processed lunch meats. Salami. Fatback. Hotdogs. Bratwurst. Salted nuts and seeds. Canned beans with added salt. Canned or smoked fish. Whole eggs or egg yolks. Chicken or turkey with skin. Dairy Whole or 2% milk, cream, and half-and-half. Whole or full-fat cream cheese. Whole-fat or sweetened yogurt. Full-fat cheese. Nondairy creamers. Whipped toppings.  Processed cheese and cheese spreads. Fats and oils Butter. Stick margarine. Lard. Shortening. Ghee. Bacon fat. Tropical oils, such as coconut, palm kernel, or palm oil. Seasoning and other foods Salted popcorn and pretzels. Onion salt, garlic salt, seasoned salt, table salt, and sea salt. Worcestershire sauce. Tartar sauce. Barbecue sauce. Teriyaki sauce. Soy sauce, including reduced-sodium. Steak sauce. Canned and packaged gravies. Fish sauce. Oyster sauce. Cocktail sauce. Horseradish that you find on the shelf. Ketchup. Mustard. Meat flavorings and tenderizers. Bouillon cubes. Hot sauce and Tabasco sauce. Premade or packaged marinades. Premade or packaged taco seasonings. Relishes. Regular salad dressings. Where to find more information:  National Heart, Lung, and Blood Institute: www.nhlbi.nih.gov  American Heart Association: www.heart.org Summary  The DASH eating plan is a healthy eating plan that has been shown to reduce high blood pressure (hypertension). It may also reduce your risk for type 2 diabetes, heart disease, and stroke.  With the DASH eating plan, you should limit salt (sodium) intake to 2,300 mg a day. If you have hypertension, you may need to reduce your sodium intake to 1,500 mg a day.  When on the DASH eating plan, aim to eat more fresh fruits and vegetables, whole grains, lean proteins, low-fat dairy, and heart-healthy fats.  Work with your health care provider or diet and nutrition specialist (dietitian) to adjust your eating plan to your individual   calorie needs. This information is not intended to replace advice given to you by your health care provider. Make sure you discuss any questions you have with your health care provider. Document Released: 12/08/2010 Document Revised: 12/13/2015 Document Reviewed: 12/13/2015 Elsevier Interactive Patient Education  2017 Elsevier Inc.  

## 2016-04-12 ENCOUNTER — Other Ambulatory Visit (INDEPENDENT_AMBULATORY_CARE_PROVIDER_SITE_OTHER): Payer: BC Managed Care – PPO

## 2016-04-12 DIAGNOSIS — I10 Essential (primary) hypertension: Secondary | ICD-10-CM

## 2016-04-12 LAB — T4, FREE: Free T4: 0.95 ng/dL (ref 0.60–1.60)

## 2016-05-18 ENCOUNTER — Ambulatory Visit: Payer: BC Managed Care – PPO | Admitting: Family Medicine

## 2016-06-13 ENCOUNTER — Encounter: Payer: Self-pay | Admitting: Family Medicine

## 2016-06-13 ENCOUNTER — Ambulatory Visit (INDEPENDENT_AMBULATORY_CARE_PROVIDER_SITE_OTHER): Payer: BC Managed Care – PPO | Admitting: Family Medicine

## 2016-06-13 DIAGNOSIS — M75111 Incomplete rotator cuff tear or rupture of right shoulder, not specified as traumatic: Secondary | ICD-10-CM

## 2016-06-13 NOTE — Patient Instructions (Addendum)
Good to see you  Lets start exercises at this time.  Exercises 3 times a week.  Keep hands within peripheral vision with most movements through the day  Keep doing everything else  See me again in 6 weeks.

## 2016-06-13 NOTE — Assessment & Plan Note (Signed)
Doing better. We'll start home exercise program at this time. Encourage him to continue the vitamin D supplementation. We discussed icing regimen. Patient continues to improve we will continue to monitor. Worse MRI.

## 2016-06-13 NOTE — Progress Notes (Signed)
[name] [address] [place], [place] [address] Phone: [phone] Subjective:    I'm seeing this patient by the request  of:  Venia Carbon, MD   CC: Right shoulder pain f/u   [id number]  Dakota Branch is a 66 y.o. male coming in with complaint of right shoulder pain. Patient was seen previously and did have more of a rotator cuff degenerative tear as well as what appeared to be mild to moderate osteophytic changes of the glenohumeral joint and moderate changes of the acromioclavicular joint. Patient was having worsening pain and was given a right shoulder injection at last follow-up. Asian was to continue with home exercises and icing protocol area and patient states has made 75% improvement. Still has some crepitus he states. States that certain range of motion causes loud audible popping. States the pain is significantly improved. Patient did have x-rays taken 03/08/2016 that confirmed degenerative change of the acromioclavicular joint     Past Medical History:  Diagnosis Date  . Colon polyps   . ED (erectile dysfunction)   . GERD (gastroesophageal reflux disease)   . Hyperlipidemia   . Hypertension   . OSA (obstructive sleep apnea)    Past Surgical History:  Procedure Laterality Date  . hepatitis ( prob A)  1978   Social History   Social History  . Marital status: Married    Spouse name: N/A  . Number of children: 4  . Years of education: N/A   Occupational History  . owner     [employer]   Social History Main Topics  . Smoking status: Former Smoker    Types: Cigarettes  . Smokeless tobacco: Never Used     Comment: Never everyday smoker, "bummed" when in college  . Alcohol use Yes     Comment: rare  . Drug use: No  . Sexual activity: Yes    Birth control/ protection: Abstinence   Other Topics Concern  . None   Social History Narrative   Pt is a Teacher, early years/pre.   Allergies  Allergen Reactions    . Aspirin     REACTION: toxic reaction as a child--but this is unclear  . Lisinopril-Hydrochlorothiazide     REACTION: itching   Family History  Problem Relation Age of Onset  . Hydrocephalus Mother        shunt  . Arthritis Mother   . Diabetes Mother   . Hypertension Father   . Heart attack Maternal Grandmother   . Diabetes Maternal Aunt   . Breast cancer Sister   . Colon cancer Neg Hx   . Esophageal cancer Neg Hx   . Rectal cancer Neg Hx   . Stomach cancer Neg Hx     Past medical history, social, surgical and family history all reviewed in electronic medical record.  No pertanent information unless stated regarding to the chief complaint.   Review of Systems: No headache, visual changes, nausea, vomiting, diarrhea, constipation, dizziness, abdominal pain, skin rash, fevers, chills, night sweats, weight loss, swollen lymph nodes, body aches, joint swelling, muscle aches, chest pain, shortness of breath, mood changes.    Objective  Blood pressure 110/74, height 5' 9.25" (1.759 m), weight 193 lb (87.5 kg).   Systems examined below as of 06/13/16 General: NAD A&O x3 mood, affect normal  HEENT: Pupils equal, extraocular movements intact no nystagmus Respiratory: not short of breath at rest or with speaking Cardiovascular: No lower extremity edema, non tender Skin: Warm dry  intact with no signs of infection or rash on extremities or on axial skeleton. Abdomen: Soft nontender, no masses Neuro: Cranial nerves  intact, neurovascularly intact in all extremities with 2+ DTRs and 2+ pulses. Lymph: No lymphadenopathy appreciated today  Gait normal with good balance and coordination.  MSK: Non tender with full range of motion and good stability and symmetric strength and tone of , elbows, wrist,  knee hips and ankles bilaterally.   Shoulder: Right Inspection reveals no abnormalities, atrophy or asymmetry. Palpation is normal with no tenderness over AC joint or bicipital groove. ROM  is full in all planes. This is some crepitus with external rotation Rotator cuff strength normal throughout. Mild positive impingement still remaining Speeds and Yergason's tests normal. No labral pathology noted with negative Obrien's, negative clunk and good stability. Normal scapular function observed. No painful arc and no drop arm sign. No apprehension sign  Procedure note 97110; 15 minutes spent for Therapeutic exercises as stated in above notes.  This included exercises focusing on stretching, strengthening, with significant focus on eccentric aspects. Shoulder Exercises that included:  Basic scapular stabilization to include adduction and depression of scapula Scaption, focusing on proper movement and good control Internal and External rotation utilizing a theraband, with elbow tucked at side entire time Rows with theraband    Proper technique shown and discussed handout in great detail with ATC.  All questions were discussed and answered.     Impression and Recommendations:     This case required medical decision making of moderate complexity.      Note: This dictation was prepared with Dragon dictation along with smaller phrase technology. Any transcriptional errors that result from this process are unintentional.

## 2016-06-18 ENCOUNTER — Other Ambulatory Visit: Payer: Self-pay | Admitting: Internal Medicine

## 2016-07-27 ENCOUNTER — Other Ambulatory Visit (INDEPENDENT_AMBULATORY_CARE_PROVIDER_SITE_OTHER): Payer: BC Managed Care – PPO

## 2016-07-27 ENCOUNTER — Encounter: Payer: Self-pay | Admitting: Family Medicine

## 2016-07-27 ENCOUNTER — Ambulatory Visit (INDEPENDENT_AMBULATORY_CARE_PROVIDER_SITE_OTHER): Payer: BC Managed Care – PPO | Admitting: Family Medicine

## 2016-07-27 VITALS — BP 122/80 | HR 70 | Ht 69.25 in | Wt 193.0 lb

## 2016-07-27 DIAGNOSIS — M353 Polymyalgia rheumatica: Secondary | ICD-10-CM

## 2016-07-27 DIAGNOSIS — M75111 Incomplete rotator cuff tear or rupture of right shoulder, not specified as traumatic: Secondary | ICD-10-CM | POA: Diagnosis not present

## 2016-07-27 DIAGNOSIS — M25511 Pain in right shoulder: Secondary | ICD-10-CM | POA: Diagnosis not present

## 2016-07-27 LAB — COMPREHENSIVE METABOLIC PANEL
ALBUMIN: 4.5 g/dL (ref 3.5–5.2)
ALT: 26 U/L (ref 0–53)
AST: 18 U/L (ref 0–37)
Alkaline Phosphatase: 59 U/L (ref 39–117)
BUN: 13 mg/dL (ref 6–23)
CALCIUM: 9.6 mg/dL (ref 8.4–10.5)
CHLORIDE: 103 meq/L (ref 96–112)
CO2: 31 mEq/L (ref 19–32)
CREATININE: 0.98 mg/dL (ref 0.40–1.50)
GFR: 81.85 mL/min (ref >60.00)
Glucose, Bld: 110 mg/dL — ABNORMAL HIGH (ref 70–99)
POTASSIUM: 3.6 meq/L (ref 3.5–5.1)
Sodium: 141 mEq/L (ref 135–145)
Total Bilirubin: 1.9 mg/dL — ABNORMAL HIGH (ref 0.2–1.2)
Total Protein: 7.7 g/dL (ref 6.0–8.3)

## 2016-07-27 LAB — CBC WITH DIFFERENTIAL/PLATELET
BASOS PCT: 0.5 % (ref 0.0–3.0)
Basophils Absolute: 0 10*3/uL (ref 0.0–0.1)
EOS ABS: 0.1 10*3/uL (ref 0.0–0.7)
Eosinophils Relative: 2.2 % (ref 0.0–5.0)
HEMATOCRIT: 44.3 % (ref 39.0–52.0)
HEMOGLOBIN: 15 g/dL (ref 13.0–17.0)
LYMPHS PCT: 32.3 % (ref 12.0–46.0)
Lymphs Abs: 1.8 10*3/uL (ref 0.7–4.0)
MCHC: 33.8 g/dL (ref 30.0–36.0)
MCV: 88.3 fl (ref 78.0–100.0)
MONOS PCT: 7.3 % (ref 3.0–12.0)
Monocytes Absolute: 0.4 10*3/uL (ref 0.1–1.0)
Neutro Abs: 3.2 10*3/uL (ref 1.4–7.7)
Neutrophils Relative %: 57.7 % (ref 43.0–77.0)
Platelets: 271 10*3/uL (ref 150.0–400.0)
RBC: 5.01 Mil/uL (ref 4.22–5.81)
RDW: 12.7 % (ref 11.5–15.5)
WBC: 5.5 10*3/uL (ref 4.0–10.5)

## 2016-07-27 LAB — IBC PANEL
IRON: 108 ug/dL (ref 42–165)
Saturation Ratios: 28 % (ref 20.0–50.0)
Transferrin: 276 mg/dL (ref 212.0–360.0)

## 2016-07-27 LAB — TSH: TSH: 2.6 u[IU]/mL (ref 0.35–4.50)

## 2016-07-27 LAB — URIC ACID: Uric Acid, Serum: 5.6 mg/dL (ref 4.0–7.8)

## 2016-07-27 LAB — C-REACTIVE PROTEIN: CRP: 0.1 mg/dL — ABNORMAL LOW (ref 0.5–20.0)

## 2016-07-27 LAB — VITAMIN D 25 HYDROXY (VIT D DEFICIENCY, FRACTURES): VITD: 20.55 ng/mL — ABNORMAL LOW (ref 30.00–100.00)

## 2016-07-27 MED ORDER — VITAMIN D (ERGOCALCIFEROL) 1.25 MG (50000 UNIT) PO CAPS: 50000.0000 [IU] | ORAL_CAPSULE | ORAL | 0 refills | Status: DC

## 2016-07-27 NOTE — Assessment & Plan Note (Signed)
Labs ordered to see if anything comes back and rule out autoimmune

## 2016-07-27 NOTE — Progress Notes (Signed)
[facility] [address], [place] [address] Phone: [phone] Subjective:    I'm seeing this patient by the request  of:  Venia Carbon, MD   CC: Right shoulder pain f/u   [id number]  Dakota Branch is a 66 y.o. male coming in with complaint of right shoulder pain. Patient was seen previously and did have more of a rotator cuff degenerative tear as well as what appeared to be mild to moderate osteophytic changes of the glenohumeral joint and moderate changes of the acromioclavicular joint. Patient did do better after the injection initially but now is having worsening symptoms. Patient states now affecting daily activities and waking him up at night. Noticing increasing weakness. States that has been using the right side more and more regularly. Patient states that the left side is having similar presentation though. Concerned because it is affecting her job as well as home life.   states he has noticed more body aches, muscle aches, and has noticed that certain joint swelling him from time to time. States yesterday was his right hand in 2 weeks ago as his left knee. Seems to stay around for 1-2 days and then resolved.  Past Medical History:  Diagnosis Date  . Colon polyps   . ED (erectile dysfunction)   . GERD (gastroesophageal reflux disease)   . Hyperlipidemia   . Hypertension   . OSA (obstructive sleep apnea)    Past Surgical History:  Procedure Laterality Date  . hepatitis ( prob A)  1978   Social History   Social History  . Marital status: Married    Spouse name: N/A  . Number of children: 4  . Years of education: N/A   Occupational History  . owner     [employer]   Social History Main Topics  . Smoking status: Former Smoker    Types: Cigarettes  . Smokeless tobacco: Never Used     Comment: Never everyday smoker, "bummed" when in college  . Alcohol use Yes     Comment: rare  . Drug use: No  . Sexual activity:  Yes    Birth control/ protection: Abstinence   Other Topics Concern  . None   Social History Narrative   Pt is a Teacher, early years/pre.   Allergies  Allergen Reactions  . Aspirin     REACTION: toxic reaction as a child--but this is unclear  . Lisinopril-Hydrochlorothiazide     REACTION: itching   Family History  Problem Relation Age of Onset  . Hydrocephalus Mother        shunt  . Arthritis Mother   . Diabetes Mother   . Hypertension Father   . Heart attack Maternal Grandmother   . Diabetes Maternal Aunt   . Breast cancer Sister   . Colon cancer Neg Hx   . Esophageal cancer Neg Hx   . Rectal cancer Neg Hx   . Stomach cancer Neg Hx     Past medical history, social, surgical and family history all reviewed in electronic medical record.  No pertanent information unless stated regarding to the chief complaint.   Review of Systems: No headache, visual changes, nausea, vomiting, diarrhea, constipation, dizziness, abdominal pain, skin rash, fevers, chills, night sweats, weight loss, swollen lymph nodes, , chest pain, shortness of breath, mood changes.  Positive muscle aches, body aches, joint swelling  Objective  Blood pressure 122/80, pulse 70, height 5' 9.25" (1.759 m), weight 193 lb (87.5 kg), SpO2 98 %.  Systems examined below as of 07/27/16 General: NAD A&O x3 mood, affect normal  HEENT: Pupils equal, extraocular movements intact no nystagmus Respiratory: not short of breath at rest or with speaking Cardiovascular: No lower extremity edema, non tender Skin: Warm dry intact with no signs of infection or rash on extremities or on axial skeleton. Abdomen: Soft nontender, no masses Neuro: Cranial nerves  intact, neurovascularly intact in all extremities with 2+ DTRs and 2+ pulses. Lymph: No lymphadenopathy appreciated today  Gait normal with good balance and coordination.  MSK: tender with full range of motion and good stability and symmetric strength and tone of   elbows, wrist,  knee hips and ankles bilaterally.    Shoulder: Right Inspection reveals no abnormalities, atrophy or asymmetry. Diffuse tenderness of the shoulder girdle Mild limitation lacking the last 5 of forward flexion in the last 5 of external rotation Rotator cuff strength 4 out of 5 compared to the contralateral side which is worse than previous exam Positive impingement with Neer's and Hawkins Speeds and Yergason's tests positive. Positive O'Brien's Normal scapular function observed. Positive painful arc but negative drop arm sign No apprehension sign  Contralateral shoulder has mild impingement      Impression and Recommendations:     This case required medical decision making of moderate complexity.      Note: This dictation was prepared with Dragon dictation along with smaller phrase technology. Any transcriptional errors that result from this process are unintentional.

## 2016-07-27 NOTE — Patient Instructions (Addendum)
Good to see you  Alvera Singh is your friend.  We will get MRI ordered.  Also labs today  Downstairs I will write you in my chart and tell you next steps or any changes.

## 2016-07-27 NOTE — Addendum Note (Signed)
Addended by: Douglass Rivers T on: 07/27/2016 04:01 PM   Modules accepted: Orders

## 2016-07-27 NOTE — Assessment & Plan Note (Signed)
Patient responded short-term to an injection. Having worsening pain with worsening symptoms at this time. Affecting daily activities as well as having weakness. Patient is having pain in the contralateral shoulder as well that is concerning. I do believe that with patient failing all conservative therapy fails imaging is warranted at this time. Patient will have an MR arthrogram to rule out any labral pathology in addition to the rotator cuff tear for further evaluation. We are also evaluating the amount of osteoarthritic changes. Depending on findings patient could be a candidate for repair if needed. Discuss otherwise.

## 2016-07-28 ENCOUNTER — Other Ambulatory Visit (INDEPENDENT_AMBULATORY_CARE_PROVIDER_SITE_OTHER): Payer: BC Managed Care – PPO

## 2016-07-28 DIAGNOSIS — M25511 Pain in right shoulder: Secondary | ICD-10-CM | POA: Diagnosis not present

## 2016-07-28 LAB — PTH, INTACT AND CALCIUM
CALCIUM: 9 mg/dL (ref 8.6–10.3)
PTH: 41 pg/mL (ref 14–64)

## 2016-07-28 LAB — CALCIUM, IONIZED: Calcium, Ion: 5 mg/dL (ref 4.8–5.6)

## 2016-07-28 LAB — RHEUMATOID FACTOR

## 2016-07-28 LAB — ANGIOTENSIN CONVERTING ENZYME: Angiotensin-Converting Enzyme: 47 U/L (ref 9–67)

## 2016-07-28 LAB — SEDIMENTATION RATE: SED RATE: 15 mm/h (ref 0–20)

## 2016-07-31 LAB — CYCLIC CITRUL PEPTIDE ANTIBODY, IGG: Cyclic Citrullin Peptide Ab: <16 Units

## 2016-07-31 LAB — ANA: ANA: NEGATIVE

## 2016-08-04 ENCOUNTER — Encounter: Payer: Self-pay | Admitting: Family Medicine

## 2016-08-14 ENCOUNTER — Ambulatory Visit (INDEPENDENT_AMBULATORY_CARE_PROVIDER_SITE_OTHER): Payer: BC Managed Care – PPO | Admitting: Internal Medicine

## 2016-08-14 ENCOUNTER — Encounter: Payer: Self-pay | Admitting: Internal Medicine

## 2016-08-14 VITALS — BP 120/76 | HR 75 | Temp 97.7°F | Wt 194.0 lb

## 2016-08-14 DIAGNOSIS — L255 Unspecified contact dermatitis due to plants, except food: Secondary | ICD-10-CM | POA: Diagnosis not present

## 2016-08-14 MED ORDER — CLOBETASOL PROPIONATE 0.05 % EX LOTN: 1.0000 "application " | TOPICAL_LOTION | Freq: Two times a day (BID) | CUTANEOUS | 0 refills | Status: DC

## 2016-08-14 NOTE — Assessment & Plan Note (Signed)
Fairly striking but very limited distribution Should be reaching the peak or past peak Will try stronger cortisone lotion

## 2016-08-14 NOTE — Progress Notes (Signed)
   Subjective:    Patient ID: Dakota Branch, male    DOB: October 09, 1950, 66 y.o.   MRN: [id number]  HPI Here with wife Helped sister pressure wash her house--was careful to avoid the greenery--but the pressure washing equipment might have This was 7/31  Rash came out 2 days later Progressively worsening Tried triamcinolone cream ---didn't help  First on left posterior ankle Now on right also  Also baking powder--may have helped some  Current Outpatient Prescriptions on File Prior to Visit  Medication Sig Dispense Refill  . amLODipine (NORVASC) 5 MG tablet TAKE 1 TABLET BY MOUTH ONCE DAILY 90 tablet 3  . Diclofenac Sodium (PENNSAID) 2 % SOLN Place 2 application onto the skin 2 (two) times daily. 112 g 3  . sildenafil (REVATIO) 20 MG tablet TAKE 3-5 TABLETS BY MOUTH DAILY AS NEEDED 10 tablet 5  . Vitamin D, Ergocalciferol, (DRISDOL) 50000 units CAPS capsule Take 1 capsule (50,000 Units total) by mouth every 7 (seven) days. 12 capsule 0   No current facility-administered medications on file prior to visit.     Allergies  Allergen Reactions  . Aspirin     REACTION: toxic reaction as a child--but this is unclear  . Lisinopril-Hydrochlorothiazide     REACTION: itching    Past Medical History:  Diagnosis Date  . Colon polyps   . ED (erectile dysfunction)   . GERD (gastroesophageal reflux disease)   . Hyperlipidemia   . Hypertension   . OSA (obstructive sleep apnea)     Past Surgical History:  Procedure Laterality Date  . hepatitis ( prob A)  1978    Family History  Problem Relation Age of Onset  . Hydrocephalus Mother        shunt  . Arthritis Mother   . Diabetes Mother   . Hypertension Father   . Heart attack Maternal Grandmother   . Diabetes Maternal Aunt   . Breast cancer Sister   . Colon cancer Neg Hx   . Esophageal cancer Neg Hx   . Rectal cancer Neg Hx   . Stomach cancer Neg Hx     Social History   Social History  . Marital status: Married   Spouse name: N/A  . Number of children: 4  . Years of education: N/A   Occupational History  . owner     [employer]   Social History Main Topics  . Smoking status: Former Smoker    Types: Cigarettes  . Smokeless tobacco: Never Used     Comment: Never everyday smoker, "bummed" when in college  . Alcohol use Yes     Comment: rare  . Drug use: No  . Sexual activity: Yes    Birth control/ protection: Abstinence   Other Topics Concern  . Not on file   Social History Narrative   Pt is a Teacher, early years/pre.   Review of Systems  No fever Hasn't felt sickl     Objective:   Physical Exam  Skin:  Large raised vesicles on posterior left ankle--and smaller papular areas. Not hot or sig tenderness Anterior right ankle is typical papulovesicular rash          Assessment & Plan:

## 2016-08-17 ENCOUNTER — Encounter: Payer: Self-pay | Admitting: Family Medicine

## 2016-09-01 ENCOUNTER — Encounter: Payer: Self-pay | Admitting: Family Medicine

## 2016-09-01 ENCOUNTER — Ambulatory Visit (INDEPENDENT_AMBULATORY_CARE_PROVIDER_SITE_OTHER): Payer: BC Managed Care – PPO | Admitting: Family Medicine

## 2016-09-01 VITALS — BP 138/80 | HR 91 | Temp 98.5°F | Ht 69.25 in | Wt 189.8 lb

## 2016-09-01 DIAGNOSIS — B9689 Other specified bacterial agents as the cause of diseases classified elsewhere: Secondary | ICD-10-CM

## 2016-09-01 DIAGNOSIS — J019 Acute sinusitis, unspecified: Secondary | ICD-10-CM

## 2016-09-01 MED ORDER — AMOXICILLIN-POT CLAVULANATE 875-125 MG PO TABS: 1.0000 | ORAL_TABLET | Freq: Two times a day (BID) | ORAL | 0 refills | Status: AC

## 2016-09-01 NOTE — Progress Notes (Signed)
PCP: Venia Carbon, MD  Subjective:  Dakota Branch is a 66 y.o. year old very pleasant male patient who presents with sinusitis symptoms including nasal congestion, sinus tenderness. Aug 11 started with allergy symptoms- Itchy mouth, throat slightly sore, sinus congestion Took benadryl for allergies and poison ivy and this helped some. Then on August 22nd saw dentist and had 9 fillings and 1 crown. Fair amount of anesthetic and felt very run down after that. Since then has started to feel sinuses more congested and Thick yellow discharge from nose or sometimes yellow runny. Having some muscle aches and feeling tired. Feels sweaty at times but has not had measured fever.  -day of illness:20 days of allergy symptoms, 10 days of worsening sinus discharge and discomfort -Symptoms show no change in last few days -sick contacts/travel/risks: no obvious flu exposure.  -Hx of: allergies  ROS-denies fever, SOB, NVD, tooth pain  Pertinent Past Medical History-  Patient Active Problem List   Diagnosis Date Noted  . Contact dermatitis due to plant 08/14/2016  . Polymyalgia ([place]) 07/27/2016  . Impaired fasting glucose 04/07/2016  . Right rotator cuff tear 04/06/2016  . Bicipital tendonitis of left shoulder 05/05/2014  . ED (erectile dysfunction)   . Hyperlipidemia   . Right shoulder pain 10/20/2011  . Routine general medical examination at a health care facility 02/22/2011  . Obstructive sleep apnea 09/06/2007  . SLEEP DISORDER 09/27/2006  . Essential hypertension, benign 09/12/2006  . GERD 09/12/2006  . Personal history of colonic polyps 09/12/2006    Medications- reviewed  Current Outpatient Prescriptions  Medication Sig Dispense Refill  . amLODipine (NORVASC) 5 MG tablet TAKE 1 TABLET BY MOUTH ONCE DAILY 90 tablet 3  . Clobetasol Propionate 0.05 % lotion Apply 1 application topically 2 (two) times daily. 118 mL 0  . Diclofenac Sodium (PENNSAID) 2 % SOLN Place 2 application onto the  skin 2 (two) times daily. 112 g 3  . sildenafil (REVATIO) 20 MG tablet TAKE 3-5 TABLETS BY MOUTH DAILY AS NEEDED 10 tablet 5  . Vitamin D, Ergocalciferol, (DRISDOL) 50000 units CAPS capsule Take 1 capsule (50,000 Units total) by mouth every 7 (seven) days. 12 capsule 0  . amoxicillin-clavulanate (AUGMENTIN) 875-125 MG tablet Take 1 tablet by mouth 2 (two) times daily. 14 tablet 0   No current facility-administered medications for this visit.     Objective: BP 138/80 (BP Location: Left Arm, Patient Position: Sitting, Cuff Size: Large)   Pulse 91   Temp 98.5 F (36.9 C) (Oral)   Ht 5' 9.25" (1.759 m)   Wt 189 lb 12.8 oz (86.1 kg)   SpO2 95%   BMI 27.83 kg/m  Gen: NAD, resting comfortably HEENT: Turbinates erythematous with yellow drainage, TM normal, pharynx mildly erythematous with no tonsilar exudate or edema, maxillary mild sinus tenderness CV: RRR no murmurs rubs or gallops Lungs: CTAB no crackles, wheeze, rhonchi Abdomen: soft/nontender/nondistended/normal bowel sounds. No rebound or guarding.  Ext: no edema Skin: warm, dry, no rash  Assessment/Plan:  Sinsusitis Bacterial based on: Symptoms >10 days  Treatment: -considered steroid: we opted out for now- trial antibiotics - very sensitive to medications he states. Initially didn't want to use augmentin -discussed front line. Discussed doxycyline but he already has baseline photosensitivity. Offered azithromycin but he wants something "stronger" so ultimately he did opt back for augmentin  -other symptomatic care with mucinex to loosen congestion -Antibiotic indicated: yes  Finally, we reviewed reasons to return to care including if symptoms worsen or persist  or new concerns arise (particularly fever or shortness of breath)  Meds ordered this encounter  Medications  . amoxicillin-clavulanate (AUGMENTIN) 875-125 MG tablet    Sig: Take 1 tablet by mouth 2 (two) times daily.    Dispense:  14 tablet    Refill:  0     Garret Reddish, MD

## 2016-09-01 NOTE — Patient Instructions (Signed)
Sinsusitis Bacterial based on: Symptoms >10 days  Treatment: -considered steroid: we opted out for now- trial antibiotics -other symptomatic care with mucinex to loosen congestion -Antibiotic indicated: yes  Finally, we reviewed reasons to return to care including if symptoms worsen or persist or new concerns arise (particularly fever or shortness of breath)  Meds ordered this encounter  Medications  . amoxicillin-clavulanate (AUGMENTIN) 875-125 MG tablet    Sig: Take 1 tablet by mouth 2 (two) times daily.    Dispense:  14 tablet    Refill:  0

## 2016-09-07 ENCOUNTER — Encounter: Payer: Self-pay | Admitting: Internal Medicine

## 2016-09-07 ENCOUNTER — Ambulatory Visit (INDEPENDENT_AMBULATORY_CARE_PROVIDER_SITE_OTHER): Payer: BC Managed Care – PPO | Admitting: Internal Medicine

## 2016-09-07 VITALS — BP 124/80 | HR 69 | Temp 98.1°F | Wt 189.0 lb

## 2016-09-07 DIAGNOSIS — R5383 Other fatigue: Secondary | ICD-10-CM | POA: Diagnosis not present

## 2016-09-07 NOTE — Assessment & Plan Note (Signed)
Symptoms are vague Could be related to recent sinus infection Discussed staying away from the benedryl No HSM or glands Recent blood work benign Sweats easy but no fever and no heart murmur (or stigmata of endocarditis) No synovitis or symptoms of rheumatologic disease Reassured Okay to do cataract Recheck if persist

## 2016-09-07 NOTE — Progress Notes (Signed)
Subjective:    Patient ID: Dakota Branch, male    DOB: 14-Jul-1950, 66 y.o.   MRN: [id number]  HPI Here with wife  Still having frontal pain Had cold symptoms and was taking benedryl Then 8/22, had crown on tooth and 9 fillings done----took benedryl before this Felt really bad after this--like he would pass out for a few minutes Tried some water and finally felt some better  Since then, still feels weak, exhausted, body pain, fatigued Purulent nasal drainage  Seen by Dr Yong Channel and started on augmentin Drainage is better--but still some sore throat Seemed some better and went to the beach Then "down" again yesterday Various pains  Lots of sweating--has to change his shirts No fever  Current Outpatient Prescriptions on File Prior to Visit  Medication Sig Dispense Refill  . amLODipine (NORVASC) 5 MG tablet TAKE 1 TABLET BY MOUTH ONCE DAILY 90 tablet 3  . amoxicillin-clavulanate (AUGMENTIN) 875-125 MG tablet Take 1 tablet by mouth 2 (two) times daily. 14 tablet 0  . Clobetasol Propionate 0.05 % lotion Apply 1 application topically 2 (two) times daily. 118 mL 0  . Diclofenac Sodium (PENNSAID) 2 % SOLN Place 2 application onto the skin 2 (two) times daily. 112 g 3  . sildenafil (REVATIO) 20 MG tablet TAKE 3-5 TABLETS BY MOUTH DAILY AS NEEDED 10 tablet 5  . Vitamin D, Ergocalciferol, (DRISDOL) 50000 units CAPS capsule Take 1 capsule (50,000 Units total) by mouth every 7 (seven) days. 12 capsule 0   No current facility-administered medications on file prior to visit.     Allergies  Allergen Reactions  . Aspirin     REACTION: toxic reaction as a child--but this is unclear  . Lisinopril-Hydrochlorothiazide     REACTION: itching    Past Medical History:  Diagnosis Date  . Colon polyps   . ED (erectile dysfunction)   . GERD (gastroesophageal reflux disease)   . Hyperlipidemia   . Hypertension   . OSA (obstructive sleep apnea)     Past Surgical History:  Procedure  Laterality Date  . hepatitis ( prob A)  1978    Family History  Problem Relation Age of Onset  . Hydrocephalus Mother        shunt  . Arthritis Mother   . Diabetes Mother   . Hypertension Father   . Heart attack Maternal Grandmother   . Diabetes Maternal Aunt   . Breast cancer Sister   . Colon cancer Neg Hx   . Esophageal cancer Neg Hx   . Rectal cancer Neg Hx   . Stomach cancer Neg Hx     Social History   Social History  . Marital status: Married    Spouse name: N/A  . Number of children: 4  . Years of education: N/A   Occupational History  . owner     [employer]   Social History Main Topics  . Smoking status: Former Smoker    Types: Cigarettes  . Smokeless tobacco: Never Used     Comment: Never everyday smoker, "bummed" when in college  . Alcohol use Yes     Comment: rare  . Drug use: No  . Sexual activity: Yes    Birth control/ protection: Abstinence   Other Topics Concern  . Not on file   Social History Narrative   Pt is a Teacher, early years/pre.   Review of Systems No cough No SOB No apparent joint swelling No diarrhea Appetite is okay Due for cataract surgery---has been  postponing due to weak feeling No sig weight loss    Objective:   Physical Exam  Constitutional: He appears well-developed and well-nourished. No distress.  HENT:  Mouth/Throat: Oropharynx is clear and moist. No oropharyngeal exudate.  Neck: No thyromegaly present.  Cardiovascular: Normal rate, regular rhythm and normal heart sounds.  Exam reveals no gallop.   No murmur heard. Pulmonary/Chest: Effort normal and breath sounds normal. No respiratory distress. He has no wheezes. He has no rales.  Abdominal: Soft. Bowel sounds are normal. He exhibits no distension. There is no tenderness. There is no rebound and no guarding.  No HSM  Musculoskeletal:  No joint swelling/synovitis  Lymphadenopathy:    He has no cervical adenopathy.  Psychiatric: He has a normal mood and  affect. His behavior is normal.          Assessment & Plan:

## 2016-10-04 ENCOUNTER — Ambulatory Visit
Admission: RE | Admit: 2016-10-04 | Discharge: 2016-10-04 | Disposition: A | Discharge status: Home / Self Care | Payer: BC Managed Care – PPO | Source: Ambulatory Visit | Attending: Family Medicine | Admitting: Family Medicine

## 2016-10-04 DIAGNOSIS — M25511 Pain in right shoulder: Secondary | ICD-10-CM

## 2016-10-04 MED ORDER — IOPAMIDOL (ISOVUE-M 200) INJECTION 41%
15.0000 mL | Freq: Once | INTRAMUSCULAR | Status: AC
  Administered 2016-10-04: 15 mL via INTRA_ARTICULAR

## 2016-10-05 ENCOUNTER — Encounter: Payer: Self-pay | Admitting: Family Medicine

## 2016-10-05 ENCOUNTER — Telehealth: Payer: Self-pay | Admitting: Family Medicine

## 2016-10-05 NOTE — Telephone Encounter (Signed)
Patient called stating that he had a message asking him if he would like to be referred to a surgeon for his shoulder.  Patient states that he would.  Is requesting call back in regard.

## 2016-10-06 ENCOUNTER — Other Ambulatory Visit: Payer: Self-pay | Admitting: *Deleted

## 2016-10-06 DIAGNOSIS — M25511 Pain in right shoulder: Secondary | ICD-10-CM

## 2016-10-06 MED ORDER — VITAMIN D (ERGOCALCIFEROL) 1.25 MG (50000 UNIT) PO CAPS: 50000.0000 [IU] | ORAL_CAPSULE | ORAL | 0 refills | Status: DC

## 2016-10-06 NOTE — Telephone Encounter (Signed)
Spoke with pt, advised him we are referring him to Dr. Tamera Punt @ [facility] they will contact him to schedule an appt. Pt understood.

## 2016-10-24 ENCOUNTER — Ambulatory Visit (INDEPENDENT_AMBULATORY_CARE_PROVIDER_SITE_OTHER): Payer: BC Managed Care – PPO | Admitting: Orthopaedic Surgery

## 2016-10-24 DIAGNOSIS — M67921 Unspecified disorder of synovium and tendon, right upper arm: Secondary | ICD-10-CM | POA: Insufficient documentation

## 2016-10-24 DIAGNOSIS — M75121 Complete rotator cuff tear or rupture of right shoulder, not specified as traumatic: Secondary | ICD-10-CM | POA: Diagnosis not present

## 2016-10-24 DIAGNOSIS — M7541 Impingement syndrome of right shoulder: Secondary | ICD-10-CM | POA: Diagnosis not present

## 2016-10-24 NOTE — Progress Notes (Signed)
Office Visit Note   Patient: Dakota Branch           Date of Birth: 06/21/1950           MRN: [id number] Visit Date: 10/24/2016              Requested by: Venia Carbon, MD [place], [place] [id number] PCP: Venia Carbon, MD   Assessment & Plan: Visit Diagnoses:  1. Impingement syndrome of right shoulder   2. Complete tear of right rotator cuff   3. Biceps tendinopathy, right     Plan: Impression is large supraspinatus tear with retraction and partial tearing of the subscapularis with bicipital tendinopathy and acromioclavicular arthrosis.  We had a long discussion today regarding treatment options and I would recommend arthroscopic repair of the rotator cuff tear as well as a biceps tenotomy and acromioplasty with distal clavicle excision.  We discussed the risk benefits alternatives of surgery and the expected recovery time.  He understands and will give [place] a call when he is ready.  He is supposed to undergo cataract surgery soon.  Questions encouraged and answered. Total face to face encounter time was greater than 45 minutes and over half of this time was spent in counseling and/or coordination of care.  Follow-Up Instructions: Return if symptoms worsen or fail to improve.   Orders:  No orders of the defined types were placed in this encounter.  No orders of the defined types were placed in this encounter.     Procedures: No procedures performed   Clinical Data: No additional findings.   Subjective: No chief complaint on file.   Patient is a 66 year old gentleman who comes in for a second opinion regarding his right shoulder pain that has been steadily worsening for the last 8 years.  He denies any specific injuries.  He is now developed constant pain with the use of his right arm especially with elevation.  He has trouble reaching backwards and reaching over his head.  He has had previous injections and physical therapy with temporary and  partial relief.  He has a constant 5 out of 10 pain now.  He denies any numbness and tingling.  He is right-hand dominant.  He saw Dr. Tamera Punt initially but he wanted to come see me because I had taken care of his daughter for a fractured ankle.    Review of Systems  Constitutional: Negative.   All other systems reviewed and are negative.    Objective: Vital Signs: There were no vitals taken for this visit.  Physical Exam  Constitutional: He is oriented to person, place, and time. He appears well-developed and well-nourished.  HENT:  Head: Normocephalic and atraumatic.  Eyes: Pupils are equal, round, and reactive to light.  Neck: Neck supple.  Pulmonary/Chest: Effort normal.  Abdominal: Soft.  Musculoskeletal: Normal range of motion.  Neurological: He is alert and oriented to person, place, and time.  Skin: Skin is warm.  Psychiatric: He has a normal mood and affect. His behavior is normal. Judgment and thought content normal.  Nursing note and vitals reviewed.   Ortho Exam Right shoulder exam shows a moderately positive empty can, bearhug, belly press, infraspinatus testing.  Positive Speed test.  Negative O'Brien.  Positive cross adduction.  Negative drop arm.  He has near full passive range of motion.  Mildly positive impingement signs.  He has a palpable hypertrophic acromioclavicular joint. Specialty Comments:  No specialty comments available.  Imaging: No results  found.   PMFS History: Patient Active Problem List   Diagnosis Date Noted  . Impingement syndrome of right shoulder 10/24/2016  . Complete tear of right rotator cuff 10/24/2016  . Biceps tendinopathy, right 10/24/2016  . Fatigue 09/07/2016  . Contact dermatitis due to plant 08/14/2016  . Polymyalgia ([place]) 07/27/2016  . Impaired fasting glucose 04/07/2016  . Right rotator cuff tear 04/06/2016  . Bicipital tendonitis of left shoulder 05/05/2014  . ED (erectile dysfunction)   . Hyperlipidemia   . Right  shoulder pain 10/20/2011  . Routine general medical examination at a health care facility 02/22/2011  . Obstructive sleep apnea 09/06/2007  . SLEEP DISORDER 09/27/2006  . Essential hypertension, benign 09/12/2006  . GERD 09/12/2006  . Personal history of colonic polyps 09/12/2006   Past Medical History:  Diagnosis Date  . Colon polyps   . ED (erectile dysfunction)   . GERD (gastroesophageal reflux disease)   . Hyperlipidemia   . Hypertension   . OSA (obstructive sleep apnea)     Family History  Problem Relation Age of Onset  . Hydrocephalus Mother        shunt  . Arthritis Mother   . Diabetes Mother   . Hypertension Father   . Heart attack Maternal Grandmother   . Diabetes Maternal Aunt   . Breast cancer Sister   . Colon cancer Neg Hx   . Esophageal cancer Neg Hx   . Rectal cancer Neg Hx   . Stomach cancer Neg Hx     Past Surgical History:  Procedure Laterality Date  . hepatitis ( prob A)  1978   Social History   Occupational History  . owner     [employer]   Social History Main Topics  . Smoking status: Former Smoker    Types: Cigarettes  . Smokeless tobacco: Never Used     Comment: Never everyday smoker, "bummed" when in college  . Alcohol use Yes     Comment: rare  . Drug use: No  . Sexual activity: Yes    Birth control/ protection: Abstinence

## 2016-10-27 ENCOUNTER — Ambulatory Visit (INDEPENDENT_AMBULATORY_CARE_PROVIDER_SITE_OTHER): Payer: BC Managed Care – PPO | Admitting: Orthopaedic Surgery

## 2016-10-31 ENCOUNTER — Ambulatory Visit: Payer: BC Managed Care – PPO | Admitting: Family Medicine

## 2016-12-27 ENCOUNTER — Other Ambulatory Visit: Payer: Self-pay | Admitting: Internal Medicine

## 2017-01-31 ENCOUNTER — Encounter (HOSPITAL_COMMUNITY): Payer: Self-pay

## 2017-01-31 ENCOUNTER — Other Ambulatory Visit: Payer: Self-pay

## 2017-01-31 DIAGNOSIS — I10 Essential (primary) hypertension: Secondary | ICD-10-CM | POA: Diagnosis not present

## 2017-01-31 DIAGNOSIS — Z87891 Personal history of nicotine dependence: Secondary | ICD-10-CM | POA: Insufficient documentation

## 2017-01-31 DIAGNOSIS — R1032 Left lower quadrant pain: Secondary | ICD-10-CM | POA: Diagnosis present

## 2017-01-31 DIAGNOSIS — Z79899 Other long term (current) drug therapy: Secondary | ICD-10-CM | POA: Insufficient documentation

## 2017-01-31 DIAGNOSIS — N200 Calculus of kidney: Secondary | ICD-10-CM | POA: Insufficient documentation

## 2017-01-31 NOTE — ED Triage Notes (Signed)
Pt states that about 30 minutes ago he began to have lower abd pain that radiates to his L flank, back area. Denies urinary symptoms or n/v

## 2017-02-01 ENCOUNTER — Emergency Department (HOSPITAL_COMMUNITY): Payer: BC Managed Care – PPO

## 2017-02-01 ENCOUNTER — Emergency Department (HOSPITAL_COMMUNITY)
Admission: EM | Admit: 2017-02-01 | Discharge: 2017-02-01 | Disposition: A | Discharge status: Home / Self Care | Payer: BC Managed Care – PPO | Attending: Emergency Medicine | Admitting: Emergency Medicine

## 2017-02-01 DIAGNOSIS — N200 Calculus of kidney: Secondary | ICD-10-CM

## 2017-02-01 LAB — LIPASE, BLOOD: Lipase: 35 U/L (ref 11–51)

## 2017-02-01 LAB — COMPREHENSIVE METABOLIC PANEL WITH GFR
ALT: 34 U/L (ref 17–63)
AST: 25 U/L (ref 15–41)
Albumin: 4.2 g/dL (ref 3.5–5.0)
Alkaline Phosphatase: 62 U/L (ref 38–126)
Anion gap: 9 (ref 5–15)
BUN: 13 mg/dL (ref 6–20)
CO2: 24 mmol/L (ref 22–32)
Calcium: 9.4 mg/dL (ref 8.9–10.3)
Chloride: 105 mmol/L (ref 101–111)
Creatinine, Ser: 1.11 mg/dL (ref 0.61–1.24)
GFR calc Af Amer: >60 mL/min
GFR calc non Af Amer: >60 mL/min
Glucose, Bld: 125 mg/dL — ABNORMAL HIGH (ref 65–99)
Potassium: 4.2 mmol/L (ref 3.5–5.1)
Sodium: 138 mmol/L (ref 135–145)
Total Bilirubin: 1.9 mg/dL — ABNORMAL HIGH (ref 0.3–1.2)
Total Protein: 7.3 g/dL (ref 6.5–8.1)

## 2017-02-01 LAB — URINALYSIS, ROUTINE W REFLEX MICROSCOPIC
Bacteria, UA: NONE SEEN
Bilirubin Urine: NEGATIVE
Glucose, UA: NEGATIVE mg/dL
Ketones, ur: NEGATIVE mg/dL
Leukocytes, UA: NEGATIVE
NITRITE: NEGATIVE
Protein, ur: NEGATIVE mg/dL
SQUAMOUS EPITHELIAL / LPF: NONE SEEN
Specific Gravity, Urine: 1.015 (ref 1.005–1.030)
pH: 8 (ref 5.0–8.0)

## 2017-02-01 LAB — CBC
HCT: 44 % (ref 39.0–52.0)
Hemoglobin: 14.7 g/dL (ref 13.0–17.0)
MCH: 29.6 pg (ref 26.0–34.0)
MCHC: 33.4 g/dL (ref 30.0–36.0)
MCV: 88.5 fL (ref 78.0–100.0)
Platelets: 259 10*3/uL (ref 150–400)
RBC: 4.97 MIL/uL (ref 4.22–5.81)
RDW: 12.7 % (ref 11.5–15.5)
WBC: 7.8 10*3/uL (ref 4.0–10.5)

## 2017-02-01 MED ORDER — FENTANYL CITRATE (PF) 100 MCG/2ML IJ SOLN
50.0000 ug | Freq: Once | INTRAMUSCULAR | Status: AC
Start: 2017-02-01 — End: 2017-02-01
  Administered 2017-02-01: 50 ug via INTRAVENOUS
  Filled 2017-02-01: qty 2

## 2017-02-01 MED ORDER — ONDANSETRON HCL 4 MG/2ML IJ SOLN
4.0000 mg | Freq: Once | INTRAMUSCULAR | Status: AC
  Administered 2017-02-01: 4 mg via INTRAVENOUS
  Filled 2017-02-01: qty 2

## 2017-02-01 MED ORDER — FENTANYL CITRATE (PF) 100 MCG/2ML IJ SOLN
50.0000 ug | Freq: Once | INTRAMUSCULAR | Status: AC
  Administered 2017-02-01: 50 ug via INTRAVENOUS

## 2017-02-01 MED ORDER — KETOROLAC TROMETHAMINE 30 MG/ML IJ SOLN
30.0000 mg | Freq: Once | INTRAMUSCULAR | Status: AC
Start: 2017-02-01 — End: 2017-02-01
  Administered 2017-02-01: 30 mg via INTRAVENOUS
  Filled 2017-02-01: qty 1

## 2017-02-01 MED ORDER — SODIUM CHLORIDE 0.9 % IV BOLUS (SEPSIS)
1000.0000 mL | Freq: Once | INTRAVENOUS | Status: AC
  Administered 2017-02-01: 1000 mL via INTRAVENOUS

## 2017-02-01 MED ORDER — HYDROCODONE-ACETAMINOPHEN 5-325 MG PO TABS: 1.0000 | ORAL_TABLET | Freq: Four times a day (QID) | ORAL | 0 refills | Status: DC | PRN

## 2017-02-01 MED ORDER — FENTANYL CITRATE (PF) 100 MCG/2ML IJ SOLN
100.0000 ug | Freq: Once | INTRAMUSCULAR | Status: AC
  Administered 2017-02-01: 100 ug via INTRAVENOUS
  Filled 2017-02-01: qty 2

## 2017-02-01 NOTE — ED Notes (Signed)
Pt reports left flank pain that started this evening. Pt reports burning during urination as well.

## 2017-02-01 NOTE — ED Provider Notes (Signed)
[facility] EMERGENCY DEPARTMENT Provider Note   CSN: [id number] Arrival date & time: 01/31/17  2344     History   Chief Complaint Chief Complaint  Patient presents with  . Flank Pain  . Abdominal Pain    HPI Dakota Branch is a 67 y.o. male.  The history is provided by the patient.  Flank Pain  This is a new problem. Episode onset: Just prior to arrival. The problem occurs constantly. The problem has been gradually worsening. Associated symptoms include abdominal pain. Pertinent negatives include no chest pain. Nothing aggravates the symptoms. Nothing relieves the symptoms.  Abdominal Pain   Pertinent negatives include fever and dysuria.  Patient reports onset of left lower quadrant pain/flank pain/testicle pain started prior to arrival. No fever/vomiting.  No dysuria.  He has never had this previously.   Past Medical History:  Diagnosis Date  . Colon polyps   . ED (erectile dysfunction)   . GERD (gastroesophageal reflux disease)   . Hyperlipidemia   . Hypertension   . OSA (obstructive sleep apnea)     Patient Active Problem List   Diagnosis Date Noted  . Impingement syndrome of right shoulder 10/24/2016  . Complete tear of right rotator cuff 10/24/2016  . Biceps tendinopathy, right 10/24/2016  . Fatigue 09/07/2016  . Contact dermatitis due to plant 08/14/2016  . Polymyalgia ([place]) 07/27/2016  . Impaired fasting glucose 04/07/2016  . Right rotator cuff tear 04/06/2016  . Bicipital tendonitis of left shoulder 05/05/2014  . ED (erectile dysfunction)   . Hyperlipidemia   . Right shoulder pain 10/20/2011  . Routine general medical examination at a health care facility 02/22/2011  . Obstructive sleep apnea 09/06/2007  . SLEEP DISORDER 09/27/2006  . Essential hypertension, benign 09/12/2006  . GERD 09/12/2006  . Personal history of colonic polyps 09/12/2006    Past Surgical History:  Procedure Laterality Date  . hepatitis ( prob A)  1978        Home Medications    Prior to Admission medications   Medication Sig Start Date End Date Taking? Authorizing Provider  amLODipine (NORVASC) 5 MG tablet TAKE 1 TABLET BY MOUTH ONCE DAILY 06/19/16   Venia Carbon, MD  Clobetasol Propionate 0.05 % lotion Apply 1 application topically 2 (two) times daily. 08/14/16   Venia Carbon, MD  Diclofenac Sodium (PENNSAID) 2 % SOLN Place 2 application onto the skin 2 (two) times daily. 03/08/16   Lyndal Pulley, DO  sildenafil (REVATIO) 20 MG tablet TAKE 3 TO 5 TABLETS BY MOUTH DAILY AS NEEDED 12/27/16   Venia Carbon, MD  Vitamin D, Ergocalciferol, (DRISDOL) 50000 units CAPS capsule Take 1 capsule (50,000 Units total) by mouth every 7 (seven) days. 10/06/16   Lyndal Pulley, DO    Family History Family History  Problem Relation Age of Onset  . Hydrocephalus Mother        shunt  . Arthritis Mother   . Diabetes Mother   . Hypertension Father   . Heart attack Maternal Grandmother   . Diabetes Maternal Aunt   . Breast cancer Sister   . Colon cancer Neg Hx   . Esophageal cancer Neg Hx   . Rectal cancer Neg Hx   . Stomach cancer Neg Hx     Social History Social History   Tobacco Use  . Smoking status: Former Smoker    Types: Cigarettes  . Smokeless tobacco: Never Used  . Tobacco comment: Never everyday smoker, "bummed" when in  college  Substance Use Topics  . Alcohol use: Yes    Comment: rare  . Drug use: No     Allergies   Aspirin and Lisinopril-hydrochlorothiazide   Review of Systems Review of Systems  Constitutional: Negative for fever.  Cardiovascular: Negative for chest pain.  Gastrointestinal: Positive for abdominal pain.  Genitourinary: Positive for flank pain. Negative for dysuria.  All other systems reviewed and are negative.    Physical Exam Updated Vital Signs BP 134/74   Pulse 70   Temp 98.1 F (36.7 C) (Oral)   Resp 18   SpO2 97%   Physical Exam  CONSTITUTIONAL: Well developed/well  nourished, uncomfortable appearing HEAD: Normocephalic/atraumatic EYES: EOMI ENMT: Mucous membranes moist NECK: supple no meningeal signs SPINE/BACK:entire spine nontender CV: S1/S2 noted, no murmurs/rubs/gallops noted LUNGS: Lungs are clear to auscultation bilaterally, no apparent distress ABDOMEN: soft, mild left lower quadrant tenderness, no rebound or guarding, bowel sounds noted throughout abdomen GU:no cva tenderness, no testicular tenderness, no hernia, family member at bedside per family request NEURO: Pt is awake/alert/appropriate, moves all extremitiesx4.  No facial droop.   EXTREMITIES: pulses normal/equal, full ROM SKIN: warm, color normal PSYCH: no abnormalities of mood noted, alert and oriented to situation  ED Treatments / Results  Labs (all labs ordered are listed, but only abnormal results are displayed) Labs Reviewed  COMPREHENSIVE METABOLIC PANEL - Abnormal; Notable for the following components:      Result Value   Glucose, Bld 125 (*)    Total Bilirubin 1.9 (*)    All other components within normal limits  URINALYSIS, ROUTINE W REFLEX MICROSCOPIC - Abnormal; Notable for the following components:   APPearance TURBID (*)    Hgb urine dipstick SMALL (*)    All other components within normal limits  LIPASE, BLOOD  CBC    EKG  EKG Interpretation None       Radiology Ct Renal Stone Study  Result Date: 02/01/2017 CLINICAL DATA:  Sudden onset lower abdominal pain radiating to the left flank and back with burning during urination. EXAM: CT ABDOMEN AND PELVIS WITHOUT CONTRAST TECHNIQUE: Multidetector CT imaging of the abdomen and pelvis was performed following the standard protocol without IV contrast. COMPARISON:  None. FINDINGS: Lower chest: Mild dependent atelectasis in the lung bases. Hepatobiliary: No focal liver abnormality is seen. No gallstones, gallbladder wall thickening, or biliary dilatation. Pancreas: Unremarkable. No pancreatic ductal dilatation or  surrounding inflammatory changes. Spleen: Normal in size without focal abnormality. Adrenals/Urinary Tract: No adrenal gland nodules. There is a tiny intrarenal stone in the lower pole left kidney measuring less than 2 mm diameter. There is mild stranding around the left kidney and ureter. There is mild left hydronephrosis and hydroureter throughout. No stones are demonstrated. This could represent obstruction due to a non radiopaque stone or stricture, reflux/pyelonephritis, or sequela of a recently passed stone. The right kidney and ureter are unremarkable. Bladder is unremarkable. Stomach/Bowel: Stomach is within normal limits. Appendix appears normal. No evidence of bowel wall thickening, distention, or inflammatory changes. Vascular/Lymphatic: Aortic atherosclerosis. No enlarged abdominal or pelvic lymph nodes. Reproductive: Prostate gland is enlarged at 4.8 cm diameter. Prostate calcifications are present. Other: No abdominal wall hernia or abnormality. No abdominopelvic ascites. Musculoskeletal: No acute or significant osseous findings. IMPRESSION: 1. Left hydronephrosis and hydroureter without obstructing stone demonstrated. This could represent obstruction due to a non radiopaque stone or stricture, reflux/pyelonephritis, or sequela of a recently passed stone. 2. Tiny nonobstructing intrarenal stone in the lower pole left kidney. 3.  Enlarged prostate gland. 4. Aortic atherosclerosis. Electronically Signed   By: Lucienne Capers M.D.   On: 02/01/2017 03:17    Procedures Procedures   Medications Ordered in ED Medications  fentaNYL (SUBLIMAZE) injection 50 mcg (50 mcg Intravenous Given 02/01/17 0216)  ondansetron (ZOFRAN) injection 4 mg (4 mg Intravenous Given 02/01/17 0216)  fentaNYL (SUBLIMAZE) injection 50 mcg (50 mcg Intravenous Given 02/01/17 0250)  fentaNYL (SUBLIMAZE) injection 100 mcg (100 mcg Intravenous Given 02/01/17 0424)  sodium chloride 0.9 % bolus 1,000 mL (0 mLs Intravenous Stopped  02/01/17 0559)  ketorolac (TORADOL) 30 MG/ML injection 30 mg (30 mg Intravenous Given 02/01/17 0559)     Initial Impression / Assessment and Plan / ED Course  I have reviewed the triage vital signs and the nursing notes.  Pertinent labs & imaging results that were available during my care of the patient were reviewed by me and considered in my medical decision making (see chart for details). Narcotic database reviewed and considered in decision making    2:43 AM Due to no onset flank pain abdominal pain and testicle pain, likely kidney stone.  Patient is still uncomfortable will proceed with CT imaging. 5:46 AM CT imaging reveals hydronephrosis and probable recently passed kidney stone. He is not responding to multiple doses of fentanyl.  I initially did not want to give him Toradol due to listed aspirin allergy.  I discussed at length his aspirin allergy, he reports when he was a child he took 1 dose of aspirin and got admitted to the hospital but thinks it was a toxic reaction.  He denied having any difficulty breathing or facial swelling at that time review of chart reveals he does use diclofenac topical before, and patient thinks he is used over-the-counter medicines/NSAIDs previously.  I really feel that he would benefit benefit from Toradol and will give this now IV 7:01 AM Patient reports feeling improved with Toradol.  He feels like he may have actually passed the stone while urinating.  Overall he is appropriate for discharge  Final Clinical Impressions(s) / ED Diagnoses   Final diagnoses:  Kidney stone    ED Discharge Orders        Ordered    HYDROcodone-acetaminophen (NORCO/VICODIN) 5-325 MG tablet  Every 6 hours PRN     02/01/17 0645       Ripley Fraise, MD 02/01/17 [phone]

## 2017-02-02 ENCOUNTER — Telehealth: Payer: Self-pay

## 2017-02-02 NOTE — Telephone Encounter (Signed)
Spoke to pt. He said he is feeling much better now.

## 2017-04-03 ENCOUNTER — Other Ambulatory Visit: Payer: Self-pay | Admitting: Internal Medicine

## 2017-04-03 DIAGNOSIS — R7301 Impaired fasting glucose: Secondary | ICD-10-CM

## 2017-04-03 DIAGNOSIS — I10 Essential (primary) hypertension: Secondary | ICD-10-CM

## 2017-04-03 DIAGNOSIS — Z125 Encounter for screening for malignant neoplasm of prostate: Secondary | ICD-10-CM

## 2017-04-09 ENCOUNTER — Other Ambulatory Visit (INDEPENDENT_AMBULATORY_CARE_PROVIDER_SITE_OTHER): Payer: BC Managed Care – PPO

## 2017-04-09 DIAGNOSIS — I10 Essential (primary) hypertension: Secondary | ICD-10-CM

## 2017-04-09 DIAGNOSIS — R7301 Impaired fasting glucose: Secondary | ICD-10-CM | POA: Diagnosis not present

## 2017-04-09 DIAGNOSIS — Z125 Encounter for screening for malignant neoplasm of prostate: Secondary | ICD-10-CM | POA: Diagnosis not present

## 2017-04-09 LAB — COMPREHENSIVE METABOLIC PANEL
ALK PHOS: 69 U/L (ref 39–117)
ALT: 29 U/L (ref 0–53)
AST: 22 U/L (ref 0–37)
Albumin: 4.2 g/dL (ref 3.5–5.2)
BUN: 14 mg/dL (ref 6–23)
CHLORIDE: 103 meq/L (ref 96–112)
CO2: 30 meq/L (ref 19–32)
Calcium: 9.2 mg/dL (ref 8.4–10.5)
Creatinine, Ser: 0.84 mg/dL (ref 0.40–1.50)
GFR: 97.57 mL/min (ref >60.00)
GLUCOSE: 101 mg/dL — AB (ref 70–99)
POTASSIUM: 4.4 meq/L (ref 3.5–5.1)
SODIUM: 139 meq/L (ref 135–145)
Total Bilirubin: 1.5 mg/dL — ABNORMAL HIGH (ref 0.2–1.2)
Total Protein: 7 g/dL (ref 6.0–8.3)

## 2017-04-09 LAB — CBC
HEMATOCRIT: 42.5 % (ref 39.0–52.0)
HEMOGLOBIN: 14.6 g/dL (ref 13.0–17.0)
MCHC: 34.4 g/dL (ref 30.0–36.0)
MCV: 88.3 fl (ref 78.0–100.0)
Platelets: 274 10*3/uL (ref 150.0–400.0)
RBC: 4.82 Mil/uL (ref 4.22–5.81)
RDW: 12.9 % (ref 11.5–15.5)
WBC: 6.8 10*3/uL (ref 4.0–10.5)

## 2017-04-09 LAB — LIPID PANEL
Cholesterol: 191 mg/dL (ref 0–200)
HDL: 32.5 mg/dL — AB (ref >39.00)
LDL CALC: 131 mg/dL — AB (ref 0–99)
NONHDL: 158.65
Total CHOL/HDL Ratio: 6
Triglycerides: 139 mg/dL (ref 0.0–149.0)
VLDL: 27.8 mg/dL (ref 0.0–40.0)

## 2017-04-09 LAB — HEMOGLOBIN A1C: Hgb A1c MFr Bld: 4.7 % (ref 4.6–6.5)

## 2017-04-09 LAB — PSA: PSA: 1.27 ng/mL (ref 0.10–4.00)

## 2017-04-11 ENCOUNTER — Ambulatory Visit (INDEPENDENT_AMBULATORY_CARE_PROVIDER_SITE_OTHER): Payer: BC Managed Care – PPO | Admitting: Internal Medicine

## 2017-04-11 ENCOUNTER — Encounter: Payer: Self-pay | Admitting: Internal Medicine

## 2017-04-11 DIAGNOSIS — Z0000 Encounter for general adult medical examination without abnormal findings: Secondary | ICD-10-CM

## 2017-04-11 DIAGNOSIS — M75121 Complete rotator cuff tear or rupture of right shoulder, not specified as traumatic: Secondary | ICD-10-CM | POA: Diagnosis not present

## 2017-04-11 DIAGNOSIS — I10 Essential (primary) hypertension: Secondary | ICD-10-CM

## 2017-04-11 DIAGNOSIS — G4733 Obstructive sleep apnea (adult) (pediatric): Secondary | ICD-10-CM

## 2017-04-11 NOTE — Assessment & Plan Note (Signed)
Healthy Colon due 2023 PSA just normal prevnar at 55 Prefers no flu vaccines

## 2017-04-11 NOTE — Progress Notes (Signed)
Subjective:    Patient ID: Dakota Branch, male    DOB: 1950-10-14, 67 y.o.   MRN: [id number]  HPI Here for physical  Has been on vitamin D since he had muscle pain  Taking the high dose vitamin D since then Had kidney stone in January--no recurrence Discussed stopping the high dose med-and considering just a multivitamin  Has started walking on trail in [place] No gym work---limited by his shoulders (ongoing problems with this) Needs shoulder surgery --waiting for Medicare  Current Outpatient Medications on File Prior to Visit  Medication Sig Dispense Refill  . amLODipine (NORVASC) 5 MG tablet TAKE 1 TABLET BY MOUTH ONCE DAILY 90 tablet 3  . sildenafil (REVATIO) 20 MG tablet TAKE 3 TO 5 TABLETS BY MOUTH DAILY AS NEEDED 50 tablet 11   No current facility-administered medications on file prior to visit.     Allergies  Allergen Reactions  . Aspirin     REACTION: toxic reaction as a child--but this is unclear  . Lisinopril-Hydrochlorothiazide     REACTION: itching    Past Medical History:  Diagnosis Date  . Colon polyps   . ED (erectile dysfunction)   . GERD (gastroesophageal reflux disease)   . Hyperlipidemia   . Hypertension   . OSA (obstructive sleep apnea)     Past Surgical History:  Procedure Laterality Date  . hepatitis ( prob A)  1978    Family History  Problem Relation Age of Onset  . Hydrocephalus Mother        shunt  . Arthritis Mother   . Diabetes Mother   . Hypertension Father   . Heart attack Maternal Grandmother   . Diabetes Maternal Aunt   . Breast cancer Sister   . Colon cancer Neg Hx   . Esophageal cancer Neg Hx   . Rectal cancer Neg Hx   . Stomach cancer Neg Hx     Social History   Socioeconomic History  . Marital status: Married    Spouse name: Not on file  . Number of children: 4  . Years of education: Not on file  . Highest education level: Not on file  Occupational History  . Occupation: owner    Comment: Animal nutritionist  . Financial resource strain: Not on file  . Food insecurity:    Worry: Not on file    Inability: Not on file  . Transportation needs:    Medical: Not on file    Non-medical: Not on file  Tobacco Use  . Smoking status: Former Smoker    Types: Cigarettes  . Smokeless tobacco: Never Used  . Tobacco comment: Never everyday smoker, "bummed" when in college  Substance and Sexual Activity  . Alcohol use: Yes    Comment: rare  . Drug use: No  . Sexual activity: Yes    Birth control/protection: Abstinence  Lifestyle  . Physical activity:    Days per week: Not on file    Minutes per session: Not on file  . Stress: Not on file  Relationships  . Social connections:    Talks on phone: Not on file    Gets together: Not on file    Attends religious service: Not on file    Active member of club or organization: Not on file    Attends meetings of clubs or organizations: Not on file    Relationship status: Not on file  . Intimate partner violence:    Fear of current or ex  partner: Not on file    Emotionally abused: Not on file    Physically abused: Not on file    Forced sexual activity: Not on file  Other Topics Concern  . Not on file  Social History Narrative   Pt is a Teacher, early years/pre.   Review of Systems  Constitutional: Negative for fatigue and unexpected weight change.       Wears seat belt  HENT: Negative for hearing loss, tinnitus and trouble swallowing.        Has had a lot of dental fillings/crowns--okay now  Eyes: Negative for visual disturbance.       No diplopia or unilateral vision loss  Respiratory: Negative for cough, chest tightness and shortness of breath.   Cardiovascular: Positive for leg swelling. Negative for chest pain and palpitations.  Gastrointestinal: Negative for blood in stool and constipation.       Occ heartburn--- no meds  Endocrine: Negative for polydipsia and polyuria.  Genitourinary: Negative for difficulty urinating and  urgency.       Still satisfied with sildenafil  Musculoskeletal: Positive for back pain. Negative for arthralgias.       Right 4th finger sticks--no triggering at this point Shoulders are the best  Skin: Negative for rash.       No suspicious lesions  Allergic/Immunologic: Positive for environmental allergies. Negative for immunocompromised state.       Dust/paint allergies  Neurological: Negative for dizziness, syncope, light-headedness and headaches.  Hematological: Negative for adenopathy. Does not bruise/bleed easily.  Psychiatric/Behavioral: Negative for dysphoric mood and sleep disturbance. The patient is not nervous/anxious.        Objective:   Physical Exam  Constitutional: He is oriented to person, place, and time. He appears well-developed. No distress.  HENT:  Head: Normocephalic and atraumatic.  Right Ear: External ear normal.  Left Ear: External ear normal.  Mouth/Throat: Oropharynx is clear and moist. No oropharyngeal exudate.  Eyes: Pupils are equal, round, and reactive to light. Conjunctivae are normal.  Neck: No thyromegaly present.  Cardiovascular: Normal rate, regular rhythm, normal heart sounds and intact distal pulses. Exam reveals no gallop.  No murmur heard. Pulmonary/Chest: Effort normal and breath sounds normal. No respiratory distress. He has no wheezes. He has no rales.  Abdominal: Soft. There is no tenderness.  Musculoskeletal: He exhibits no edema or tenderness.  Lymphadenopathy:    He has no cervical adenopathy.  Neurological: He is alert and oriented to person, place, and time.  Skin: No rash noted. No erythema.  Psychiatric: He has a normal mood and affect. His behavior is normal.          Assessment & Plan:

## 2017-04-11 NOTE — Assessment & Plan Note (Signed)
Uses CPAP only at times when others are around--to limit snoring Wife notes less symptoms with mouth guard

## 2017-04-11 NOTE — Assessment & Plan Note (Signed)
Considering surgery when he gets Medicare

## 2017-04-11 NOTE — Assessment & Plan Note (Signed)
BP Readings from Last 3 Encounters:  04/11/17 124/84  02/01/17 130/74  09/07/16 124/80   Good control No change needed

## 2017-04-11 NOTE — Patient Instructions (Signed)
DASH Eating Plan DASH stands for "Dietary Approaches to Stop Hypertension." The DASH eating plan is a healthy eating plan that has been shown to reduce high blood pressure (hypertension). It may also reduce your risk for type 2 diabetes, heart disease, and stroke. The DASH eating plan may also help with weight loss. What are tips for following this plan? General guidelines  Avoid eating more than 2,300 mg (milligrams) of salt (sodium) a day. If you have hypertension, you may need to reduce your sodium intake to 1,500 mg a day.  Limit alcohol intake to no more than 1 drink a day for nonpregnant women and 2 drinks a day for men. One drink equals 12 oz of beer, 5 oz of wine, or 1 oz of hard liquor.  Work with your health care provider to maintain a healthy body weight or to lose weight. Ask what an ideal weight is for you.  Get at least 30 minutes of exercise that causes your heart to beat faster (aerobic exercise) most days of the week. Activities may include walking, swimming, or biking.  Work with your health care provider or diet and nutrition specialist (dietitian) to adjust your eating plan to your individual calorie needs. Reading food labels  Check food labels for the amount of sodium per serving. Choose foods with less than 5 percent of the Daily Value of sodium. Generally, foods with less than 300 mg of sodium per serving fit into this eating plan.  To find whole grains, look for the word "whole" as the first word in the ingredient list. Shopping  Buy products labeled as "low-sodium" or "no salt added."  Buy fresh foods. Avoid canned foods and premade or frozen meals. Cooking  Avoid adding salt when cooking. Use salt-free seasonings or herbs instead of table salt or sea salt. Check with your health care provider or pharmacist before using salt substitutes.  Do not fry foods. Cook foods using healthy methods such as baking, boiling, grilling, and broiling instead.  Cook with  heart-healthy oils, such as olive, canola, soybean, or sunflower oil. Meal planning   Eat a balanced diet that includes: ? 5 or more servings of fruits and vegetables each day. At each meal, try to fill half of your plate with fruits and vegetables. ? Up to 6-8 servings of whole grains each day. ? Less than 6 oz of lean meat, poultry, or fish each day. A 3-oz serving of meat is about the same size as a deck of cards. One egg equals 1 oz. ? 2 servings of low-fat dairy each day. ? A serving of nuts, seeds, or beans 5 times each week. ? Heart-healthy fats. Healthy fats called Omega-3 fatty acids are found in foods such as flaxseeds and coldwater fish, like sardines, salmon, and mackerel.  Limit how much you eat of the following: ? Canned or prepackaged foods. ? Food that is high in trans fat, such as fried foods. ? Food that is high in saturated fat, such as fatty meat. ? Sweets, desserts, sugary drinks, and other foods with added sugar. ? Full-fat dairy products.  Do not salt foods before eating.  Try to eat at least 2 vegetarian meals each week.  Eat more home-cooked food and less restaurant, buffet, and fast food.  When eating at a restaurant, ask that your food be prepared with less salt or no salt, if possible. What foods are recommended? The items listed may not be a complete list. Talk with your dietitian about what   dietary choices are best for you. Grains Whole-grain or whole-wheat bread. Whole-grain or whole-wheat pasta. Brown rice. Oatmeal. Quinoa. Bulgur. Whole-grain and low-sodium cereals. Pita bread. Low-fat, low-sodium crackers. Whole-wheat flour tortillas. Vegetables Fresh or frozen vegetables (raw, steamed, roasted, or grilled). Low-sodium or reduced-sodium tomato and vegetable juice. Low-sodium or reduced-sodium tomato sauce and tomato paste. Low-sodium or reduced-sodium canned vegetables. Fruits All fresh, dried, or frozen fruit. Canned fruit in natural juice (without  added sugar). Meat and other protein foods Skinless chicken or turkey. Ground chicken or turkey. Pork with fat trimmed off. Fish and seafood. Egg whites. Dried beans, peas, or lentils. Unsalted nuts, nut butters, and seeds. Unsalted canned beans. Lean cuts of beef with fat trimmed off. Low-sodium, lean deli meat. Dairy Low-fat (1%) or fat-free (skim) milk. Fat-free, low-fat, or reduced-fat cheeses. Nonfat, low-sodium ricotta or cottage cheese. Low-fat or nonfat yogurt. Low-fat, low-sodium cheese. Fats and oils Soft margarine without trans fats. Vegetable oil. Low-fat, reduced-fat, or light mayonnaise and salad dressings (reduced-sodium). Canola, safflower, olive, soybean, and sunflower oils. Avocado. Seasoning and other foods Herbs. Spices. Seasoning mixes without salt. Unsalted popcorn and pretzels. Fat-free sweets. What foods are not recommended? The items listed may not be a complete list. Talk with your dietitian about what dietary choices are best for you. Grains Baked goods made with fat, such as croissants, muffins, or some breads. Dry pasta or rice meal packs. Vegetables Creamed or fried vegetables. Vegetables in a cheese sauce. Regular canned vegetables (not low-sodium or reduced-sodium). Regular canned tomato sauce and paste (not low-sodium or reduced-sodium). Regular tomato and vegetable juice (not low-sodium or reduced-sodium). Pickles. Olives. Fruits Canned fruit in a light or heavy syrup. Fried fruit. Fruit in cream or butter sauce. Meat and other protein foods Fatty cuts of meat. Ribs. Fried meat. Bacon. Sausage. Bologna and other processed lunch meats. Salami. Fatback. Hotdogs. Bratwurst. Salted nuts and seeds. Canned beans with added salt. Canned or smoked fish. Whole eggs or egg yolks. Chicken or turkey with skin. Dairy Whole or 2% milk, cream, and half-and-half. Whole or full-fat cream cheese. Whole-fat or sweetened yogurt. Full-fat cheese. Nondairy creamers. Whipped toppings.  Processed cheese and cheese spreads. Fats and oils Butter. Stick margarine. Lard. Shortening. Ghee. Bacon fat. Tropical oils, such as coconut, palm kernel, or palm oil. Seasoning and other foods Salted popcorn and pretzels. Onion salt, garlic salt, seasoned salt, table salt, and sea salt. Worcestershire sauce. Tartar sauce. Barbecue sauce. Teriyaki sauce. Soy sauce, including reduced-sodium. Steak sauce. Canned and packaged gravies. Fish sauce. Oyster sauce. Cocktail sauce. Horseradish that you find on the shelf. Ketchup. Mustard. Meat flavorings and tenderizers. Bouillon cubes. Hot sauce and Tabasco sauce. Premade or packaged marinades. Premade or packaged taco seasonings. Relishes. Regular salad dressings. Where to find more information:  National Heart, Lung, and Blood Institute: www.nhlbi.nih.gov  American Heart Association: www.heart.org Summary  The DASH eating plan is a healthy eating plan that has been shown to reduce high blood pressure (hypertension). It may also reduce your risk for type 2 diabetes, heart disease, and stroke.  With the DASH eating plan, you should limit salt (sodium) intake to 2,300 mg a day. If you have hypertension, you may need to reduce your sodium intake to 1,500 mg a day.  When on the DASH eating plan, aim to eat more fresh fruits and vegetables, whole grains, lean proteins, low-fat dairy, and heart-healthy fats.  Work with your health care provider or diet and nutrition specialist (dietitian) to adjust your eating plan to your individual   calorie needs. This information is not intended to replace advice given to you by your health care provider. Make sure you discuss any questions you have with your health care provider. Document Released: 12/08/2010 Document Revised: 12/13/2015 Document Reviewed: 12/13/2015 Elsevier Interactive Patient Education  2018 [employer]  

## 2017-05-23 NOTE — Progress Notes (Signed)
[name] [address], [place] [address] Phone: [phone] Subjective:     CC: Right hand pain, left thumb pain  [id number]  Dakota Branch is a 67 y.o. male coming in with complaint of right hand 4th finger trigger finger. He is also having pain in the left thumb in the IP and MCP joint. Pain has been occurring for 2 months. Patient is right handed.  Seems to be worse in the morning.  Throughout the day he gets a little bit better.  Patient is noticing though some left thumb pain associated with this now.  States that certain movements can cause a sharp severe pain of 9 out of 10 and it seems to resolve.  Possibly some swelling noted.  Does not remember any true injury.  Patient has been seen previously and did have more of a rotator cuff arthropathy.  States that unfortunately all his joints seem to be giving more pain.  Rates the severity of pain is 9 out of 10.  States that certain things that used to be using such as even going up or down stairs have become much more complicating.       Past Medical History:  Diagnosis Date  . Colon polyps   . ED (erectile dysfunction)   . GERD (gastroesophageal reflux disease)   . Hyperlipidemia   . Hypertension   . OSA (obstructive sleep apnea)    Past Surgical History:  Procedure Laterality Date  . hepatitis ( prob A)  1978   Social History   Socioeconomic History  . Marital status: Married    Spouse name: Not on file  . Number of children: 4  . Years of education: Not on file  . Highest education level: Not on file  Occupational History  . Occupation: owner    Comment: Paramedic  . Financial resource strain: Not on file  . Food insecurity:    Worry: Not on file    Inability: Not on file  . Transportation needs:    Medical: Not on file    Non-medical: Not on file  Tobacco Use  . Smoking status: Former Smoker    Types: Cigarettes  . Smokeless tobacco: Never  Used  . Tobacco comment: Never everyday smoker, "bummed" when in college  Substance and Sexual Activity  . Alcohol use: Yes    Comment: rare  . Drug use: No  . Sexual activity: Yes    Birth control/protection: Abstinence  Lifestyle  . Physical activity:    Days per week: Not on file    Minutes per session: Not on file  . Stress: Not on file  Relationships  . Social connections:    Talks on phone: Not on file    Gets together: Not on file    Attends religious service: Not on file    Active member of club or organization: Not on file    Attends meetings of clubs or organizations: Not on file    Relationship status: Not on file  Other Topics Concern  . Not on file  Social History Narrative   Pt is a Teacher, early years/pre.   Likes to do film production--hopes to get into this more.   Allergies  Allergen Reactions  . Aspirin     REACTION: toxic reaction as a child--but this is unclear  . Lisinopril-Hydrochlorothiazide     REACTION: itching   Family History  Problem Relation Age of Onset  . Hydrocephalus Mother  shunt  . Arthritis Mother   . Diabetes Mother   . Hypertension Father   . Heart attack Maternal Grandmother   . Diabetes Maternal Aunt   . Breast cancer Sister   . Colon cancer Neg Hx   . Esophageal cancer Neg Hx   . Rectal cancer Neg Hx   . Stomach cancer Neg Hx      Past medical history, social, surgical and family history all reviewed in electronic medical record.  No pertanent information unless stated regarding to the chief complaint.   Review of Systems:Review of systems updated and as accurate as of 05/24/17  No headache, visual changes, nausea, vomiting, diarrhea, constipation, dizziness, abdominal pain, skin rash, fevers, chills, night sweats, weight loss, swollen lymph nodes, , chest pain, shortness of breath, mood changes.  Positive body aches, joint swelling, muscle aches  Objective  Blood pressure 124/76, pulse 64, height 5' 9.5"  (1.765 m), weight 189 lb (85.7 kg), SpO2 95 %. Systems examined below as of 05/24/17   General: No apparent distress alert and oriented x3 mood and affect normal, dressed appropriately.  HEENT: Pupils equal, extraocular movements intact  Respiratory: Patient's speak in full sentences and does not appear short of breath  Cardiovascular: No lower extremity edema, non tender, no erythema  Skin: Warm dry intact with no signs of infection or rash on extremities or on axial skeleton.  Abdomen: Soft nontender  Neuro: Cranial nerves II through XII are intact, neurovascularly intact in all extremities with 2+ DTRs and 2+ pulses.  Lymph: No lymphadenopathy of posterior or anterior cervical chain or axillae bilaterally.  Gait normal with good balance and coordination.  MSK:  Non tender with full range of motion and good stability and symmetric strength and tone of shoulders, elbows, wrist, hip, knee and ankles bilaterally.  Right ring finger does have a nodule noted on the palmar aspect of the hand at the A2 pulley.  Triggering noted.  Tender to palpation.  Wrist exam fairly unremarkable except for some mild swelling of the metacarpal joints bilaterally.  Left thumb shows the patient does have a positive grind test.  Tender eminence though is still symmetric to the contralateral side.  Patient does have good grip strength.  UCL appears to be intact.  Limited musculoskeletal ultrasound was performed and interpreted by Lyndal Pulley  Limited ultrasound of patient's ring finger on the right side does show a trigger nodule noted. Left thumb has CMC arthritis Impression: Trigger finger- CMC arthritis.     Impression and Recommendations:     This case required medical decision making of moderate complexity.      Note: This dictation was prepared with Dragon dictation along with smaller phrase technology. Any transcriptional errors that result from this process are unintentional.

## 2017-05-24 ENCOUNTER — Ambulatory Visit: Payer: Self-pay

## 2017-05-24 ENCOUNTER — Ambulatory Visit: Payer: BC Managed Care – PPO | Admitting: Family Medicine

## 2017-05-24 ENCOUNTER — Encounter

## 2017-05-24 ENCOUNTER — Encounter: Payer: Self-pay | Admitting: Family Medicine

## 2017-05-24 ENCOUNTER — Other Ambulatory Visit (INDEPENDENT_AMBULATORY_CARE_PROVIDER_SITE_OTHER): Payer: BC Managed Care – PPO

## 2017-05-24 VITALS — BP 124/76 | HR 64 | Ht 69.5 in | Wt 189.0 lb

## 2017-05-24 DIAGNOSIS — M2550 Pain in unspecified joint: Secondary | ICD-10-CM

## 2017-05-24 DIAGNOSIS — M1811 Unilateral primary osteoarthritis of first carpometacarpal joint, right hand: Secondary | ICD-10-CM | POA: Diagnosis not present

## 2017-05-24 DIAGNOSIS — M6530 Trigger finger, unspecified finger: Secondary | ICD-10-CM | POA: Insufficient documentation

## 2017-05-24 DIAGNOSIS — M79641 Pain in right hand: Secondary | ICD-10-CM

## 2017-05-24 DIAGNOSIS — M79642 Pain in left hand: Secondary | ICD-10-CM | POA: Diagnosis not present

## 2017-05-24 DIAGNOSIS — M65341 Trigger finger, right ring finger: Secondary | ICD-10-CM

## 2017-05-24 LAB — CBC WITH DIFFERENTIAL/PLATELET
BASOS PCT: 0.3 % (ref 0.0–3.0)
Basophils Absolute: 0 10*3/uL (ref 0.0–0.1)
EOS ABS: 0.1 10*3/uL (ref 0.0–0.7)
Eosinophils Relative: 1.9 % (ref 0.0–5.0)
HEMATOCRIT: 41.4 % (ref 39.0–52.0)
Hemoglobin: 14.3 g/dL (ref 13.0–17.0)
LYMPHS PCT: 25.7 % (ref 12.0–46.0)
Lymphs Abs: 1.5 10*3/uL (ref 0.7–4.0)
MCHC: 34.5 g/dL (ref 30.0–36.0)
MCV: 87 fl (ref 78.0–100.0)
MONOS PCT: 7.6 % (ref 3.0–12.0)
Monocytes Absolute: 0.4 10*3/uL (ref 0.1–1.0)
NEUTROS ABS: 3.7 10*3/uL (ref 1.4–7.7)
Neutrophils Relative %: 64.5 % (ref 43.0–77.0)
PLATELETS: 247 10*3/uL (ref 150.0–400.0)
RBC: 4.76 Mil/uL (ref 4.22–5.81)
RDW: 12.5 % (ref 11.5–15.5)
WBC: 5.8 10*3/uL (ref 4.0–10.5)

## 2017-05-24 LAB — IBC PANEL
Iron: 81 ug/dL (ref 42–165)
Saturation Ratios: 22.3 % (ref 20.0–50.0)
TRANSFERRIN: 260 mg/dL (ref 212.0–360.0)

## 2017-05-24 LAB — SEDIMENTATION RATE: Sed Rate: 12 mm/hr (ref 0–20)

## 2017-05-24 LAB — TSH: TSH: 3.57 u[IU]/mL (ref 0.35–4.50)

## 2017-05-24 LAB — C-REACTIVE PROTEIN: CRP: 0.1 mg/dL — ABNORMAL LOW (ref 0.5–20.0)

## 2017-05-24 LAB — TESTOSTERONE: TESTOSTERONE: 279.86 ng/dL — AB (ref 300.00–890.00)

## 2017-05-24 LAB — VITAMIN B12: VITAMIN B 12: 215 pg/mL (ref 211–911)

## 2017-05-24 LAB — URIC ACID: Uric Acid, Serum: 6.2 mg/dL (ref 4.0–7.8)

## 2017-05-24 NOTE — Assessment & Plan Note (Signed)
Splinting at night  If worsening injection

## 2017-05-24 NOTE — Patient Instructions (Signed)
Good to see you  We will get labs today and I will write you in my chart  pennsaid pinkie amount topically 2 times daily as needed.  Wear the finger splint at night Over the counter get  Vitamin D 2000 IU daily  Turmeric 500mg  1-2 times daily  Tart cherry extract any dose at night See me again in 3 weeks and we will make sure you are better

## 2017-05-24 NOTE — Assessment & Plan Note (Signed)
Discussed bracing  Topical nsaids  RTC in 4 weeks

## 2017-05-27 LAB — CALCIUM, IONIZED: Calcium, Ion: 5.1 mg/dL (ref 4.8–5.6)

## 2017-05-27 LAB — RHEUMATOID FACTOR

## 2017-05-27 LAB — ANA: ANA: NEGATIVE

## 2017-05-27 LAB — PTH, INTACT AND CALCIUM
CALCIUM: 9.4 mg/dL (ref 8.6–10.3)
PTH: 41 pg/mL (ref 14–64)

## 2017-05-27 LAB — ANGIOTENSIN CONVERTING ENZYME: Angiotensin-Converting Enzyme: 44 U/L (ref 9–67)

## 2017-05-27 LAB — CYCLIC CITRUL PEPTIDE ANTIBODY, IGG

## 2017-06-02 ENCOUNTER — Other Ambulatory Visit: Payer: Self-pay | Admitting: Internal Medicine

## 2017-06-12 NOTE — Progress Notes (Signed)
[name] [address], [place] [address] Phone: [phone] Subjective:      CC: Head pain follow-up  [id number]  Dakota Branch is a 67 y.o. male coming in with complaint of bilateral hand pain. Left thumb continues to bother him with grasping. 5/10 pain with grasping. No pain at rest. No worse just not better.  Right hand 4th finger is locking. Occurs daily, patient has had the pain for quite some time.  States that it does seem to be getting better very slowly.  Has not been using the finger as much with certain activities that he thinks is helpful.  Also has been massaging it.     Past Medical History:  Diagnosis Date  . Colon polyps   . ED (erectile dysfunction)   . GERD (gastroesophageal reflux disease)   . Hyperlipidemia   . Hypertension   . OSA (obstructive sleep apnea)    Past Surgical History:  Procedure Laterality Date  . hepatitis ( prob A)  1978   Social History   Socioeconomic History  . Marital status: Married    Spouse name: Not on file  . Number of children: 4  . Years of education: Not on file  . Highest education level: Not on file  Occupational History  . Occupation: owner    Comment: Paramedic  . Financial resource strain: Not on file  . Food insecurity:    Worry: Not on file    Inability: Not on file  . Transportation needs:    Medical: Not on file    Non-medical: Not on file  Tobacco Use  . Smoking status: Former Smoker    Types: Cigarettes  . Smokeless tobacco: Never Used  . Tobacco comment: Never everyday smoker, "bummed" when in college  Substance and Sexual Activity  . Alcohol use: Yes    Comment: rare  . Drug use: No  . Sexual activity: Yes    Birth control/protection: Abstinence  Lifestyle  . Physical activity:    Days per week: Not on file    Minutes per session: Not on file  . Stress: Not on file  Relationships  . Social connections:    Talks on phone: Not  on file    Gets together: Not on file    Attends religious service: Not on file    Active member of club or organization: Not on file    Attends meetings of clubs or organizations: Not on file    Relationship status: Not on file  Other Topics Concern  . Not on file  Social History Narrative   Pt is a Teacher, early years/pre.   Likes to do film production--hopes to get into this more.   Allergies  Allergen Reactions  . Aspirin     REACTION: toxic reaction as a child--but this is unclear  . Lisinopril-Hydrochlorothiazide     REACTION: itching   Family History  Problem Relation Age of Onset  . Hydrocephalus Mother        shunt  . Arthritis Mother   . Diabetes Mother   . Hypertension Father   . Heart attack Maternal Grandmother   . Diabetes Maternal Aunt   . Breast cancer Sister   . Colon cancer Neg Hx   . Esophageal cancer Neg Hx   . Rectal cancer Neg Hx   . Stomach cancer Neg Hx      Past medical history, social, surgical and family history all reviewed in  electronic medical record.  No pertanent information unless stated regarding to the chief complaint.   Review of Systems:Review of systems updated and as accurate as of 06/13/17  No headache, visual changes, nausea, vomiting, diarrhea, constipation, dizziness, abdominal pain, skin rash, fevers, chills, night sweats, weight loss, swollen lymph nodes, body aches, joint swelling, muscle aches, chest pain, shortness of breath, mood changes.   Objective  Blood pressure 140/78, pulse 66, height 5' 9.5" (1.765 m), weight 192 lb (87.1 kg), SpO2 96 %. Systems examined below as of 06/13/17   General: No apparent distress alert and oriented x3 mood and affect normal, dressed appropriately.  HEENT: Pupils equal, extraocular movements intact  Respiratory: Patient's speak in full sentences and does not appear short of breath  Cardiovascular: No lower extremity edema, non tender, no erythema  Skin: Warm dry intact with no signs of  infection or rash on extremities or on axial skeleton.  Abdomen: Soft nontender  Neuro: Cranial nerves II through XII are intact, neurovascularly intact in all extremities with 2+ DTRs and 2+ pulses.  Lymph: No lymphadenopathy of posterior or anterior cervical chain or axillae bilaterally.  Gait normal with good balance and coordination.  MSK:  Non tender with full range of motion and good stability and symmetric strength and tone of  elbows, wrist, hip, knee and ankles bilaterally.  Continued weakness of the right rotator cuff compared to the contralateral side Right ring finger show the patient does have triggering noted.  He does have a nodule at the A2 pulley but does seem to be smaller than previous exam  Left hand exam does show some arthritic changes.  Positive grind test of the [facility] with mild tenderness to palpation.   Limited musculoskeletal ultrasound was performed and interpreted by Lyndal Pulley  Limited ultrasound of patient's trigger nodule still noted in the right ring finger.  No significant other changes noted. Impression: Trigger nodule   Impression and Recommendations:     This case required medical decision making of moderate complexity.      Note: This dictation was prepared with Dragon dictation along with smaller phrase technology. Any transcriptional errors that result from this process are unintentional.

## 2017-06-13 ENCOUNTER — Encounter: Payer: Self-pay | Admitting: Family Medicine

## 2017-06-13 ENCOUNTER — Ambulatory Visit: Payer: Self-pay

## 2017-06-13 ENCOUNTER — Ambulatory Visit: Payer: BC Managed Care – PPO | Admitting: Family Medicine

## 2017-06-13 VITALS — BP 140/78 | HR 66 | Ht 69.5 in | Wt 192.0 lb

## 2017-06-13 DIAGNOSIS — M79641 Pain in right hand: Secondary | ICD-10-CM | POA: Diagnosis not present

## 2017-06-13 DIAGNOSIS — M75121 Complete rotator cuff tear or rupture of right shoulder, not specified as traumatic: Secondary | ICD-10-CM | POA: Diagnosis not present

## 2017-06-13 DIAGNOSIS — M1811 Unilateral primary osteoarthritis of first carpometacarpal joint, right hand: Secondary | ICD-10-CM

## 2017-06-13 DIAGNOSIS — M65341 Trigger finger, right ring finger: Secondary | ICD-10-CM | POA: Diagnosis not present

## 2017-06-13 NOTE — Assessment & Plan Note (Signed)
Continues to be good.  Continues to have tightness in the area.  Patient continues to have problems with declined any type of injection.  Will try bracing at night and continue with massage.  Patient will continue to monitor

## 2017-06-13 NOTE — Assessment & Plan Note (Signed)
Patient does have arthritic changes.  Declined any type of injection.  Do not think custom bracing as needed.  Continue to hold off on any other type of treatment.  Follow-up as needed

## 2017-06-13 NOTE — Assessment & Plan Note (Signed)
Encourage patient to follow-up with surgeons

## 2017-06-13 NOTE — Patient Instructions (Signed)
Good to see you  Dakota Branch is your friend.  Keep doing what you are doing Call me if you need me  DHEA 50 mg daily for 4 weeks, stop then try again for 4 weeks

## 2017-08-27 ENCOUNTER — Ambulatory Visit (INDEPENDENT_AMBULATORY_CARE_PROVIDER_SITE_OTHER): Payer: Medicare HMO | Admitting: Family Medicine

## 2017-08-27 ENCOUNTER — Encounter: Payer: Self-pay | Admitting: Family Medicine

## 2017-08-27 VITALS — BP 138/70 | HR 70 | Temp 98.3°F | Resp 16 | Wt 188.0 lb

## 2017-08-27 DIAGNOSIS — L298 Other pruritus: Secondary | ICD-10-CM

## 2017-08-27 MED ORDER — PREDNISONE 20 MG PO TABS: ORAL_TABLET | ORAL | 0 refills | Status: DC

## 2017-08-27 MED ORDER — HYDROXYZINE HCL 25 MG PO TABS: 25.0000 mg | ORAL_TABLET | Freq: Three times a day (TID) | ORAL | 0 refills | Status: DC | PRN

## 2017-08-27 NOTE — Progress Notes (Signed)
ACUTE VISIT   HPI:  No chief complaint on file.   [name] is a 67 y.o. male, who is here today with his wife complaining of pruritic, erythematous rash that he noticed yesterday. This is a new problem. Gradual onset. Pruritus is interfering with his sleep.  He was walking outdoors with grandchild before he noted rash.   He denies any new medication, detergent, soap, or body product. No known insect bite or outdoor exposures to plants. No sick contact. No Hx of eczema or similar rash in the past.  OTC medication for this problem: None.   He has not noted oral lesions/edema,cough, wheezing, dyspnea, abdominal pain, nausea, or vomiting.  No chills, fever, fatigue, no changes in appetite.  He is concerned about possible spider bite and his wife is concerned about possible shingles.   Review of Systems  Constitutional: Negative for appetite change, fatigue and fever.  HENT: Negative for mouth sores, sore throat and trouble swallowing.   Respiratory: Negative for shortness of breath and wheezing.   Cardiovascular: Negative for leg swelling.  Gastrointestinal: Negative for abdominal pain, nausea and vomiting.  Genitourinary: Negative for decreased urine volume and hematuria.  Musculoskeletal: Negative for joint swelling and myalgias.  Skin: Positive for rash. Negative for wound.  Allergic/Immunologic: Negative for environmental allergies.  Neurological: Negative for weakness, numbness and headaches.  Psychiatric/Behavioral: Positive for sleep disturbance. The patient is nervous/anxious.       Current Outpatient Medications on File Prior to Visit  Medication Sig Dispense Refill  . amLODipine (NORVASC) 5 MG tablet TAKE 1 TABLET BY MOUTH ONCE DAILY 90 tablet 3  . sildenafil (REVATIO) 20 MG tablet TAKE 3 TO 5 TABLETS BY MOUTH DAILY AS NEEDED 50 tablet 11   No current facility-administered medications on file prior to visit.      Past Medical History:   Diagnosis Date  . Colon polyps   . ED (erectile dysfunction)   . GERD (gastroesophageal reflux disease)   . Hyperlipidemia   . Hypertension   . OSA (obstructive sleep apnea)    Allergies  Allergen Reactions  . Aspirin     REACTION: toxic reaction as a child--but this is unclear  . Lisinopril-Hydrochlorothiazide     REACTION: itching    Social History   Socioeconomic History  . Marital status: Married    Spouse name: Not on file  . Number of children: 4  . Years of education: Not on file  . Highest education level: Not on file  Occupational History  . Occupation: owner    Comment: Paramedic  . Financial resource strain: Not on file  . Food insecurity:    Worry: Not on file    Inability: Not on file  . Transportation needs:    Medical: Not on file    Non-medical: Not on file  Tobacco Use  . Smoking status: Former Smoker    Types: Cigarettes  . Smokeless tobacco: Never Used  . Tobacco comment: Never everyday smoker, "bummed" when in college  Substance and Sexual Activity  . Alcohol use: Yes    Comment: rare  . Drug use: No  . Sexual activity: Yes    Birth control/protection: Abstinence  Lifestyle  . Physical activity:    Days per week: Not on file    Minutes per session: Not on file  . Stress: Not on file  Relationships  . Social connections:    Talks on phone: Not on file  Gets together: Not on file    Attends religious service: Not on file    Active member of club or organization: Not on file    Attends meetings of clubs or organizations: Not on file    Relationship status: Not on file  Other Topics Concern  . Not on file  Social History Narrative   Pt is a Teacher, early years/pre.   Likes to do film production--hopes to get into this more.    Vitals:   08/27/17 1104  BP: 138/70  Pulse: 70  Resp: 16  Temp: 98.3 F (36.8 C)  SpO2: 97%   Body mass index is 27.36 kg/m.   Physical Exam  Nursing note and vitals  reviewed. Constitutional: He is oriented to person, place, and time. He appears well-developed and well-nourished. He does not appear ill. No distress.  HENT:  Head: Normocephalic and atraumatic.  Mouth/Throat: Oropharynx is clear and moist and mucous membranes are normal.  Eyes: Conjunctivae are normal.  Cardiovascular: Normal rate and regular rhythm.  No murmur heard. Respiratory: Effort normal and breath sounds normal. No respiratory distress.  Musculoskeletal: He exhibits no edema or tenderness.  Lymphadenopathy:    He has no cervical adenopathy.  Neurological: He is alert and oriented to person, place, and time. He has normal strength. Gait normal.  Skin: Skin is warm. Rash noted. Rash is papular. No erythema.     Erythematosus papular rash, confluent on medial aspect of ankles and dorsum of feet.  Also lesions scattered along lower extremities, inguinal and lower abdominal areas. No lesions on upper extremities chest or back. No lesions on plantar or interdigital areas.   Psychiatric: His mood appears anxious.  Well groomed, good eye contact.    ASSESSMENT AND PLAN:   Mr. Diagnoses and all orders for this visit:  Pruritic erythematous rash -     predniSONE (DELTASONE) 20 MG tablet; 3 tabs for 3 days, 2 tabs for 3 days, 1 tabs for 3 days, and 1/2 tab for 3 days. Take tables together with breakfast. -     hydrOXYzine (ATARAX/VISTARIL) 25 MG tablet; Take 1 tablet (25 mg total) by mouth every 8 (eight) hours as needed for up to 10 days for itching.  We discussed possible etiologies, I am thinking about insect bites: ?fleats,chiggers among some to consider.No risk factors for scabies. He was reassuring regard to spider bite or zoster infection. I do not think rash is infectious. We will treat pruritus with systemic steroids. He is not interested in IM Depo-Medrol, so he will start prednisone taper. Hydralazine 25 mg 3 times daily as needed, mainly at night to help him  sleep. Zyrtec 10 mg in the morning and Sarna lotion OTC.  We discussed some side effects of medications. Monitor for new associated symptoms. Instructed about warning signs. Recommend following with PCP if rash has not resolved in 2 to 3 weeks or if it gets worse despite of treatment.      Return if symptoms worsen or fail to improve, for rash with PCP.     Dajon Lazar G. [place], MD  [facility]. [place] office.

## 2017-08-27 NOTE — Patient Instructions (Signed)
A few things to remember from today's visit:   Pruritic erythematous rash - Plan: predniSONE (DELTASONE) 20 MG tablet, hydrOXYzine (ATARAX/VISTARIL) 25 MG tablet  Over-the-counter Sarna lotion may help with itching. Zyrtec 10 mg in the morning. Monitor for signs of infection. If not better please follow-up with your doctor.  Please be sure medication list is accurate. If a new problem present, please set up appointment sooner than planned today.

## 2017-08-28 DIAGNOSIS — H35371 Puckering of macula, right eye: Secondary | ICD-10-CM | POA: Diagnosis not present

## 2017-08-28 DIAGNOSIS — H43813 Vitreous degeneration, bilateral: Secondary | ICD-10-CM | POA: Diagnosis not present

## 2017-08-30 ENCOUNTER — Ambulatory Visit (INDEPENDENT_AMBULATORY_CARE_PROVIDER_SITE_OTHER): Payer: Medicare HMO | Admitting: Family Medicine

## 2017-08-30 ENCOUNTER — Encounter: Payer: Self-pay | Admitting: Family Medicine

## 2017-08-30 VITALS — BP 124/78 | HR 66 | Temp 98.4°F | Ht 69.5 in | Wt 189.0 lb

## 2017-08-30 DIAGNOSIS — L298 Other pruritus: Secondary | ICD-10-CM | POA: Diagnosis not present

## 2017-08-30 NOTE — Progress Notes (Signed)
Subjective:    Patient ID: Dakota Branch, male    DOB: 15-Jul-1950, 67 y.o.   MRN: [id number]  HPI Here with rash of lower body - was seen at Dr Jacklynn Ganong by Dr [place]  Dx with pruritic erythematous rash and tx with prednisone and hydroxyzine   Prednisone -was afraid of it after reading side effects  Hydroxyzine-was afraid of side effects as well  Not taking anything Wants to know what he could put on it Used some betadyne  Extremely itchy    Wt Readings from Last 3 Encounters:  08/30/17 189 lb (85.7 kg)  08/27/17 188 lb (85.3 kg)  06/13/17 192 lb (87.1 kg)   27.51 kg/m   Started Sunday- had been in park/ boat area  He did sit on the ground (but wore shoes and socks)  The child he was with with- no rash  (also wife did not get a rash)  [place] No tick bites  He felt itchy and shook off  The next day he noticed rash on feet- then little dots all over legs   No fever  Feels ok   R foot became swollen in the last 1-2 days from the rash  Patient Active Problem List   Diagnosis Date Noted  . Trigger finger, right ring finger 05/24/2017  . Arthritis of carpometacarpal [facility]) joint of right thumb 05/24/2017  . Impingement syndrome of right shoulder 10/24/2016  . Complete tear of right rotator cuff 10/24/2016  . Biceps tendinopathy, right 10/24/2016  . Impaired fasting glucose 04/07/2016  . Pruritic erythematous rash 05/03/2015  . Bicipital tendonitis of left shoulder 05/05/2014  . ED (erectile dysfunction)   . Hyperlipidemia   . Routine general medical examination at a health care facility 02/22/2011  . Obstructive sleep apnea 09/06/2007  . SLEEP DISORDER 09/27/2006  . Essential hypertension, benign 09/12/2006  . GERD 09/12/2006   Past Medical History:  Diagnosis Date  . Colon polyps   . ED (erectile dysfunction)   . GERD (gastroesophageal reflux disease)   . Hyperlipidemia   . Hypertension   . OSA (obstructive sleep apnea)    Past Surgical  History:  Procedure Laterality Date  . hepatitis ( prob A)  1978   Social History   Tobacco Use  . Smoking status: Former Smoker    Types: Cigarettes  . Smokeless tobacco: Never Used  . Tobacco comment: Never everyday smoker, "bummed" when in college  Substance Use Topics  . Alcohol use: Yes    Comment: rare  . Drug use: No   Family History  Problem Relation Age of Onset  . Hydrocephalus Mother        shunt  . Arthritis Mother   . Diabetes Mother   . Hypertension Father   . Heart attack Maternal Grandmother   . Diabetes Maternal Aunt   . Breast cancer Sister   . Colon cancer Neg Hx   . Esophageal cancer Neg Hx   . Rectal cancer Neg Hx   . Stomach cancer Neg Hx    Allergies  Allergen Reactions  . Aspirin     REACTION: toxic reaction as a child--but this is unclear  . Lisinopril-Hydrochlorothiazide     REACTION: itching   Current Outpatient Medications on File Prior to Visit  Medication Sig Dispense Refill  . amLODipine (NORVASC) 5 MG tablet TAKE 1 TABLET BY MOUTH ONCE DAILY 90 tablet 3  . sildenafil (REVATIO) 20 MG tablet TAKE 3 TO 5 TABLETS BY MOUTH  DAILY AS NEEDED 50 tablet 11   No current facility-administered medications on file prior to visit.     Review of Systems  Constitutional: Negative for activity change, appetite change, fatigue, fever and unexpected weight change.  HENT: Negative for congestion, rhinorrhea, sore throat and trouble swallowing.   Eyes: Negative for pain, redness, itching and visual disturbance.  Respiratory: Negative for cough, chest tightness, shortness of breath and wheezing.   Cardiovascular: Negative for chest pain and palpitations.  Gastrointestinal: Negative for abdominal pain, blood in stool, constipation, diarrhea and nausea.  Endocrine: Negative for cold intolerance, heat intolerance, polydipsia and polyuria.  Genitourinary: Negative for difficulty urinating, dysuria, frequency and urgency.  Musculoskeletal: Negative for  arthralgias, joint swelling and myalgias.  Skin: Positive for rash. Negative for pallor.  Neurological: Negative for dizziness, tremors, weakness, numbness and headaches.  Hematological: Negative for adenopathy. Does not bruise/bleed easily.  Psychiatric/Behavioral: Negative for decreased concentration and dysphoric mood. The patient is not nervous/anxious.        Objective:   Physical Exam  Constitutional: He appears well-developed and well-nourished. No distress.  Well appearing   HENT:  Head: Normocephalic and atraumatic.  Mouth/Throat: Oropharynx is clear and moist.  No mouth or throat lesions or swelling  Eyes: Pupils are equal, round, and reactive to light. Conjunctivae and EOM are normal. Right eye exhibits no discharge. Left eye exhibits no discharge. No scleral icterus.  Neck: Normal range of motion. Neck supple.  Cardiovascular: Normal rate, regular rhythm and normal heart sounds.  Pulmonary/Chest: Effort normal and breath sounds normal. No respiratory distress. He has no wheezes.  Musculoskeletal: He exhibits edema.  Mild swelling of feet with rash  Lymphadenopathy:    He has no cervical adenopathy.  Neurological: He is alert. No cranial nerve deficit. Coordination normal.  Skin: Skin is warm and dry. Rash noted.  Rash - resembling allergic rxn/insect bites on bilat lower extremities and elsewhere scattered (sparing face/palms and soles)  Papules and small vesicles / few excoriations and some mild redness (no scale)  Feet are swollen -where rash/lesions are most intense  Psychiatric: He has a normal mood and affect.          Assessment & Plan:   Problem List Items Addressed This Visit      Musculoskeletal and Integument   Pruritic erythematous rash - Primary    Was papular-now some vesicles - mostly on LEs (scattered elsewhere)- with swelling in feet and severe itching  Suspect reaction/poss from insect bites (? Chiggers possibly)  Enc him to go forward with  prednisone after disc of how it works and also poss side eff Hydroxyzine for itch- he pref not to use sympt med Both of these were prev px but he did not want to take them  Disc keeping cool Also frequent cleansing with mild soap (dove) and general avoidance of fragrances and harsh chemicals  >25 minutes spent in face to face time with patient, >50% spent in counselling or coordination of care including disc of mechanism of action of prednisone and possible causes of rash  Disc red flags to return incl redness/streaks or any systemic symptoms Update if not starting to improve in a week or if worsening

## 2017-08-30 NOTE — Patient Instructions (Addendum)
Keep cool - hot conditions make itching worse  Shower in tepid water instead of hot  Use Dove soap for sensitive skin - do not scrub hard   Go ahead and take the prednisone as directed - take with food   You can try the hydroxyzine for itch as needed/ it may make you sleepy   Please keep [place] posted  Update if not starting to improve in a week or if worsening

## 2017-09-03 NOTE — Assessment & Plan Note (Signed)
Was papular-now some vesicles - mostly on LEs (scattered elsewhere)- with swelling in feet and severe itching  Suspect reaction/poss from insect bites (? Chiggers possibly)  Enc him to go forward with prednisone after disc of how it works and also poss side eff Hydroxyzine for itch- he pref not to use sympt med Both of these were prev px but he did not want to take them  Disc keeping cool Also frequent cleansing with mild soap (dove) and general avoidance of fragrances and harsh chemicals  >25 minutes spent in face to face time with patient, >50% spent in counselling or coordination of care including disc of mechanism of action of prednisone and possible causes of rash  Disc red flags to return incl redness/streaks or any systemic symptoms Update if not starting to improve in a week or if worsening

## 2017-09-04 ENCOUNTER — Telehealth: Payer: Self-pay

## 2017-09-04 NOTE — Telephone Encounter (Signed)
I spoke with pt; pt did not go anywhere to be seen; pt thinks he had an allergy. Pt took zyrtec and is feeling better. Pt is not having any trouble breathing and rash appears to be lightening. If pt needs appt pt will cb. FYI to Dr Glori Bickers who saw pt on 08/30/17.

## 2017-09-04 NOTE — Telephone Encounter (Signed)
PLEASE NOTE: All timestamps contained within this report are represented as [place] Standard Time. CONFIDENTIALTY NOTICE: This fax transmission is intended only for the addressee. It contains information that is legally privileged, confidential or otherwise protected from use or disclosure. If you are not the intended recipient, you are strictly prohibited from reviewing, disclosing, copying using or disseminating any of this information or taking any action in reliance on or regarding this information. If you have received this fax in error, please notify us immediately by telephone so that we can arrange for its return to [place]. Phone: [phone], Toll-Free: [phone], Fax: [phone] Page: 1 of 2 Call Id: [id number] [place] Patient Name: Dakota Branch Gender: Male DOB: 07/30/1950 Age: 67 Y 21 D Return Phone Number: [phone] (Primary), [phone] (Secondary) Address: City/State/Zip: [place] [address] Client [facility] Night - Client Client Site [facility] Physician Viviana Simpler - MD Contact Type Call Who Is Calling Patient / Member / Family / Caregiver Call Type Triage / Clinical Relationship To Patient Self Return Phone Number [phone] (Primary) Chief Complaint BREATHING - shortness of breath or sounds breathless Reason for Call Symptomatic / Request for [place] states he has a rash on his body. He believes he has a allergy he states his throat is itchy, cough and light asthma, Medication is predinsone. Translation No Nurse Assessment Nurse: Ann Maki, RN, Sajjad Date/Time (Eastern Time): 09/01/2017 11:10:45 PM Confirm and document reason for call. If symptomatic, describe symptoms. ---He believes he has a allergy he states his throat is itchy, coughs when he tries to clear his  throat and like asthma , took prednisone this morning for the rashes and the rash is better. states he feels like there is something stuck in his throat and is clearing his throat and it is not clearing. asked if he can take zyrtec for the allergies. Does the patient have any new or worsening symptoms? ---Yes Will a triage be completed? ---Yes Related visit to physician within the last 2 weeks? ---No Does the PT have any chronic conditions? (i.e. diabetes, asthma, this includes High risk factors for pregnancy, etc.) ---No Is this a behavioral health or substance abuse call? ---No Guidelines Guideline Title Affirmed Question Affirmed Notes Nurse Date/Time (Eastern Time) Sore Throat [1] Sore throat is the only symptom AND [2] sore throat present < 48 hours Naseem, RN, Sajjad 09/01/2017 11:16:09 PM Disp. Time Eilene Ghazi Time) Disposition Final User 09/01/2017 11:07:41 PM Send to Urgent Queue [place] 09/01/2017 11:20:07 PM Home Care Yes Naseem, RN, Sajjad PLEASE NOTE: All timestamps contained within this report are represented as [place] Standard Time. CONFIDENTIALTY NOTICE: This fax transmission is intended only for the addressee. It contains information that is legally privileged, confidential or otherwise protected from use or disclosure. If you are not the intended recipient, you are strictly prohibited from reviewing, disclosing, copying using or disseminating any of this information or taking any action in reliance on or regarding this information. If you have received this fax in error, please notify us immediately by telephone so that we can arrange for its return to [place]. Phone: [phone], Toll-Free: [phone], Fax: [phone] Page: 2 of 2 Call Id: [id number] [place] Disagree/Comply Comply Caller Understands Yes PreDisposition Did not know what to do Care Advice Given Per Guideline HOME CARE: * You should be able to treat this at home. REASSURANCE AND EDUCATION: * Most  mild sore throats and intermittent sore throats are just part of a cold and can be treated at home. SORE THROAT - For relief of sore throat: * Sip warm chicken broth or apple juice. * Suck on hard candy or a throat lozenge (OTC). * Gargle with warm salt water four times a day. To make salt water, put 1/2 teaspoon of salt in 8 oz (240 ml) of warm water. PAIN OR FEVER MEDICINES: IBUPROFEN (E.G., MOTRIN, ADVIL): ACETAMINOPHEN (E.G., TYLENOL): SOFT DIET: * Eat a soft diet. * Cold drinks, popsicles, and milk shakes are especially good. Avoid citrus fruits. DRINK PLENTY LIQUIDS: * Drink plenty of liquids. This is important to prevent dehydration. * A healthy adult should drink 8 cups (240 ml) or more of liquid each day. EXPECTED COURSE: Sore throats with viral illnesses usually last 3 or 4 days. CALL BACK IF: * Sore throat is the only symptom AND lasts over 48 hours * Sore throat with cold symptoms, and sore throat lasts over 5 days * Fever lasts over 3 days * You become worse CARE ADVICE given per Sore Throat (Adult) guideline.

## 2017-09-04 NOTE — Telephone Encounter (Signed)
Thanks for letting me know and glad the rash is improving.  Zyrtec is ok to take- it should help.  If symptoms worsen however to make appt to come in.  If any shortness of breath- go to the ED  Will cc his PCP as well

## 2017-10-03 ENCOUNTER — Ambulatory Visit: Payer: Medicare HMO | Admitting: Internal Medicine

## 2017-10-03 ENCOUNTER — Encounter: Payer: Self-pay | Admitting: Internal Medicine

## 2017-10-03 ENCOUNTER — Ambulatory Visit (INDEPENDENT_AMBULATORY_CARE_PROVIDER_SITE_OTHER): Payer: Medicare HMO | Admitting: Internal Medicine

## 2017-10-03 VITALS — BP 122/74 | HR 72 | Temp 98.2°F | Ht 69.5 in | Wt 191.0 lb

## 2017-10-03 DIAGNOSIS — L298 Other pruritus: Secondary | ICD-10-CM

## 2017-10-03 DIAGNOSIS — Z23 Encounter for immunization: Secondary | ICD-10-CM

## 2017-10-03 NOTE — Assessment & Plan Note (Signed)
Ongoing symptoms for 5 weeks Most worried about the diagnosis ---has done okay with the itching Looks most like insect bites Pattern not consistent with scabies--but probably has to be considered Chiggers are possible--but a lot of lesions and some under where his shoes were No apparent fleas in house Wife with no lesions so doubt bed bugs Will try to get him in with dermatologist

## 2017-10-03 NOTE — Addendum Note (Signed)
Addended by: Pilar Grammes on: 10/03/2017 01:29 PM   Modules accepted: Orders

## 2017-10-03 NOTE — Progress Notes (Signed)
Subjective:    Patient ID: Dakota Branch, male    DOB: [birth year], 67 y.o.   MRN: [id number]  HPI Here due to ongoing issues with rash With wife  Started 5 weeks ago Only on legs and waist Just a few on chest 1 on left arm--but gone They are persistent Itchy and occasional bubble up and then dry out  Has tried multiple creams---helps the itch but not clearing them up Tried lavender yesterday--helped itch  Wife has no rash He did have walk in woods before this stopped (family with him) 2 dogs--1 indoor and 1 outdoor. They are treated for fleas  Current Outpatient Medications on File Prior to Visit  Medication Sig Dispense Refill  . amLODipine (NORVASC) 5 MG tablet TAKE 1 TABLET BY MOUTH ONCE DAILY 90 tablet 3  . sildenafil (REVATIO) 20 MG tablet TAKE 3 TO 5 TABLETS BY MOUTH DAILY AS NEEDED 50 tablet 11   No current facility-administered medications on file prior to visit.     Allergies  Allergen Reactions  . Aspirin     REACTION: toxic reaction as a child--but this is unclear  . Lisinopril-Hydrochlorothiazide     REACTION: itching    Past Medical History:  Diagnosis Date  . Colon polyps   . ED (erectile dysfunction)   . GERD (gastroesophageal reflux disease)   . Hyperlipidemia   . Hypertension   . OSA (obstructive sleep apnea)     Past Surgical History:  Procedure Laterality Date  . hepatitis ( prob A)  1978    Family History  Problem Relation Age of Onset  . Hydrocephalus Mother        shunt  . Arthritis Mother   . Diabetes Mother   . Hypertension Father   . Heart attack Maternal Grandmother   . Diabetes Maternal Aunt   . Breast cancer Sister   . Colon cancer Neg Hx   . Esophageal cancer Neg Hx   . Rectal cancer Neg Hx   . Stomach cancer Neg Hx     Social History   Socioeconomic History  . Marital status: Married    Spouse name: Not on file  . Number of children: 4  . Years of education: Not on file  . Highest education level: Not on  file  Occupational History  . Occupation: owner    Comment: Paramedic  . Financial resource strain: Not on file  . Food insecurity:    Worry: Not on file    Inability: Not on file  . Transportation needs:    Medical: Not on file    Non-medical: Not on file  Tobacco Use  . Smoking status: Former Smoker    Types: Cigarettes  . Smokeless tobacco: Never Used  . Tobacco comment: Never everyday smoker, "bummed" when in college  Substance and Sexual Activity  . Alcohol use: Yes    Comment: rare  . Drug use: No  . Sexual activity: Yes    Birth control/protection: Abstinence  Lifestyle  . Physical activity:    Days per week: Not on file    Minutes per session: Not on file  . Stress: Not on file  Relationships  . Social connections:    Talks on phone: Not on file    Gets together: Not on file    Attends religious service: Not on file    Active member of club or organization: Not on file    Attends meetings of clubs or organizations: Not on  file    Relationship status: Not on file  . Intimate partner violence:    Fear of current or ex partner: Not on file    Emotionally abused: Not on file    Physically abused: Not on file    Forced sexual activity: Not on file  Other Topics Concern  . Not on file  Social History Narrative   Pt is a Teacher, early years/pre.   Likes to do film production--hopes to get into this more.   Review of Systems  No fever Not sick Did have some foot swelling at first--- better now     Objective:   Physical Exam  Constitutional: He appears well-developed. No distress.  Skin:  Marked single papules on ankles and feet---goes up both legs Some around waist line Few on chest None in hands/arms No linear lesions No systemic symptoms           Assessment & Plan:

## 2017-10-04 DIAGNOSIS — S80861A Insect bite (nonvenomous), right lower leg, initial encounter: Secondary | ICD-10-CM | POA: Diagnosis not present

## 2017-10-04 DIAGNOSIS — S80862A Insect bite (nonvenomous), left lower leg, initial encounter: Secondary | ICD-10-CM | POA: Diagnosis not present

## 2017-10-05 ENCOUNTER — Telehealth: Payer: Self-pay | Admitting: Internal Medicine

## 2017-10-05 ENCOUNTER — Ambulatory Visit: Payer: Self-pay | Admitting: *Deleted

## 2017-10-05 NOTE — Telephone Encounter (Unsigned)
Copied from [place] [phone]. Topic: Quick Communication - See Telephone Encounter >> Oct 05, 2017  6:31 PM Blase Mess A wrote: CRM for notification. See Telephone encounter for: 10/05/17.  Patient is having some feeling like his is getting sick he is tired inside his airway meets the nose it is uncomfortable.  Please advise [phone]

## 2017-10-05 NOTE — Telephone Encounter (Signed)
Pt calling stating that he feels like "something is there were the nose meets the throat".Pt denies any sore throat or congestion and states the feeling is not really in the throat but in the back of nose. Pt denies any nasal discharge, productive cough or fever.Pt states that this occurred about 4 hours ago today. Pt denies any difficulty breathing or swallowing. pt denies any redness or swelling to uvula.Pt states he is able to breath normal but feels weak and tired. Pt states she received the flu shot 2 days ago an was given prednisone by a dermatologist on yesterday. Pt wants to know if this could be causing current symptoms and if he could take Zyrtec. Pt advised that nasal congestion is not typically a symptom associated as a side effect with either of the medications he received recently. Pt denies having any other symptoms at this time. Pt advised to contact pharmacy regarding taking zyrtec along with prednisone. Pt also advised that if symptoms become worse to seek care in the Urgent Care. Pt verbalized understanding.  Reason for Disposition . [1] Sinus congestion as part of a cold AND [2] present < 10 days  Answer Assessment - Initial Assessment Questions 1. LOCATION: "Where does it hurt?"      No pain 2. ONSET: "When did the sinus pain start?"  (e.g., hours, days)      No 3. SEVERITY: "How bad is the pain?"   (Scale 1-10; mild, moderate or severe)   - MILD (1-3): doesn't interfere with normal activities    - MODERATE (4-7): interferes with normal activities (e.g., work or school) or awakens from sleep   - SEVERE (8-10): excruciating pain and patient unable to do any normal activities        Denies any pain 4. RECURRENT SYMPTOM: "Have you ever had sinus problems before?" If so, ask: "When was the last time?" and "What happened that time?"      yes 5. NASAL CONGESTION: "Is the nose blocked?" If so, ask, "Can you open it or must you breathe through the mouth?"     Yes but can breatthe 6.  NASAL DISCHARGE: "Do you have discharge from your nose?" If so ask, "What color?"     no 7. FEVER: "Do you have a fever?" If so, ask: "What is it, how was it measured, and when did it start?"      No 8. OTHER SYMPTOMS: "Do you have any other symptoms?" (e.g., sore throat, cough, earache, difficulty breathing)     No other symtpoms 9. PREGNANCY: "Is there any chance you are pregnant?" "When was your last menstrual period?"     N/a  Protocols used: SINUS PAIN OR CONGESTION-A-AH

## 2017-12-05 DIAGNOSIS — H43813 Vitreous degeneration, bilateral: Secondary | ICD-10-CM | POA: Diagnosis not present

## 2017-12-05 DIAGNOSIS — H35371 Puckering of macula, right eye: Secondary | ICD-10-CM | POA: Diagnosis not present

## 2017-12-05 DIAGNOSIS — H43392 Other vitreous opacities, left eye: Secondary | ICD-10-CM | POA: Diagnosis not present

## 2017-12-25 IMAGING — MR MR SHOULDER*R* W/CM
4 of 6 series · 13 of 40 positions shown · IV contrast (agent unspecified)
Comparison: None.

CLINICAL DATA: Right shoulder pain with decreased range of motion.

EXAM:
MR ARTHROGRAM OF THE RIGHT SHOULDER
TECHNIQUE: Multiplanar, multisequence MR imaging of the right shoulder was
performed following the administration of intra-articular contrast.
CONTRAST:  See Injection Documentation.

[Series 7: T1 fat-sat · axial · right · 3.0mm · 0.36mm/px · z∈[-18,+44]mm · 3 of 29 slices shown (1 of 2)]
[im 5/29]
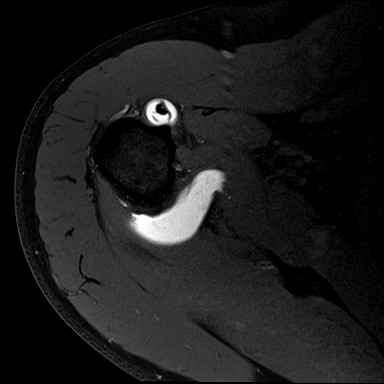
[im 17/29]
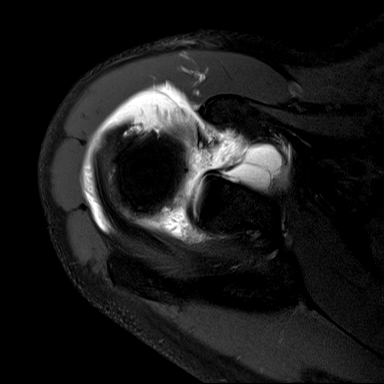
[im 25/29]
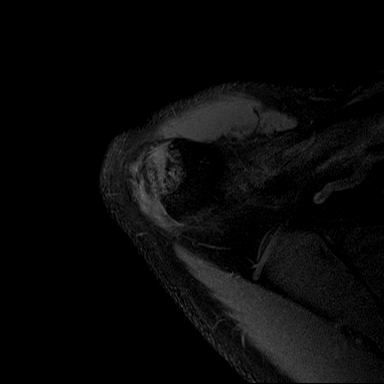

[Series 8: T2 fat-sat · oblique · right · 3.0mm · 0.22mm/px · 4 of 25 slices shown (1 of 2)]
[im 1/25]
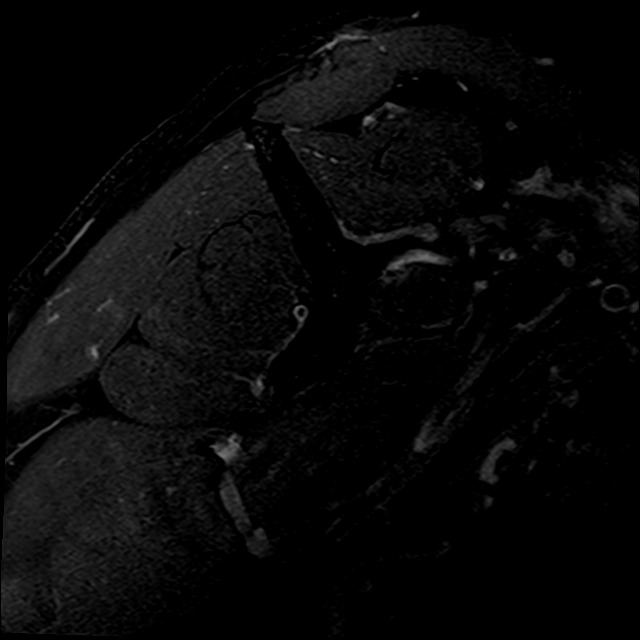
[im 5/25]
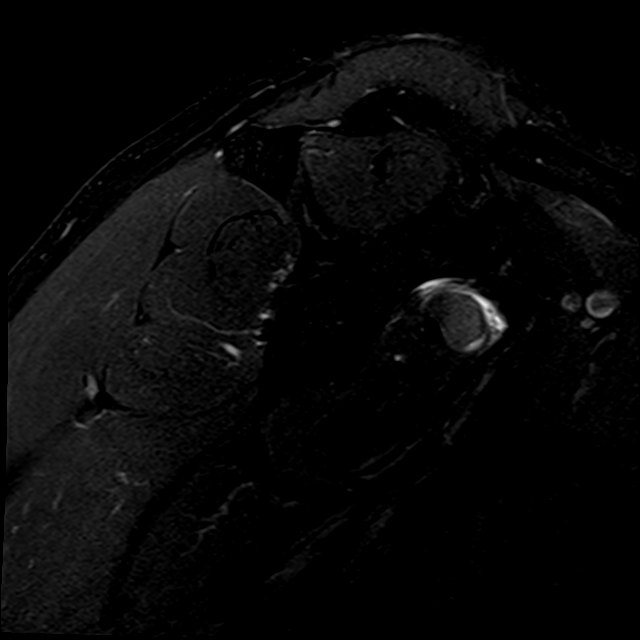
[im 13/25]
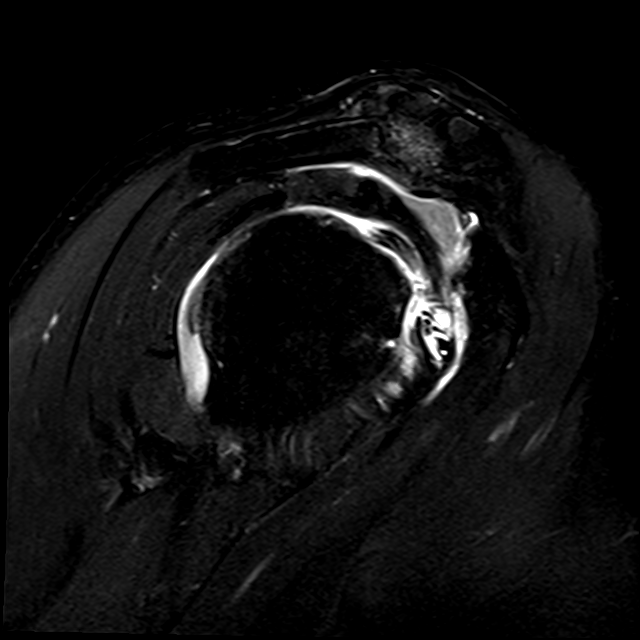
[im 21/25]
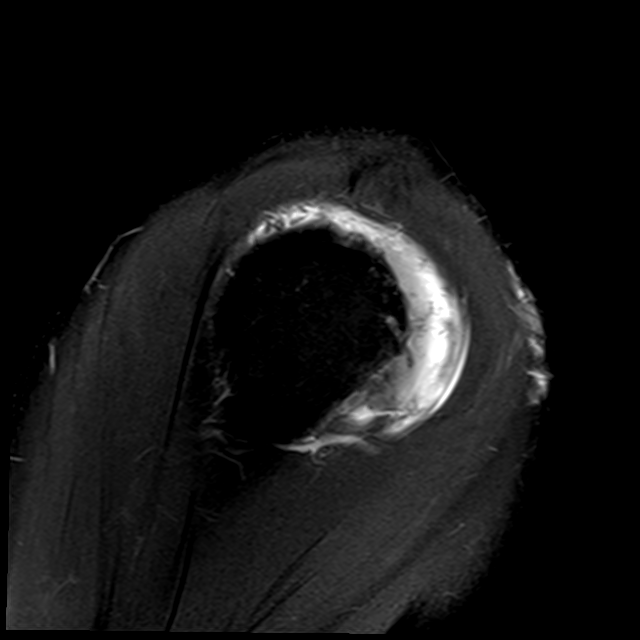

[Series 9: T1 fat-sat · oblique · right · 3.0mm · 0.18mm/px · 3 of 23 slices shown (2 of 2)]
[im 5/23]
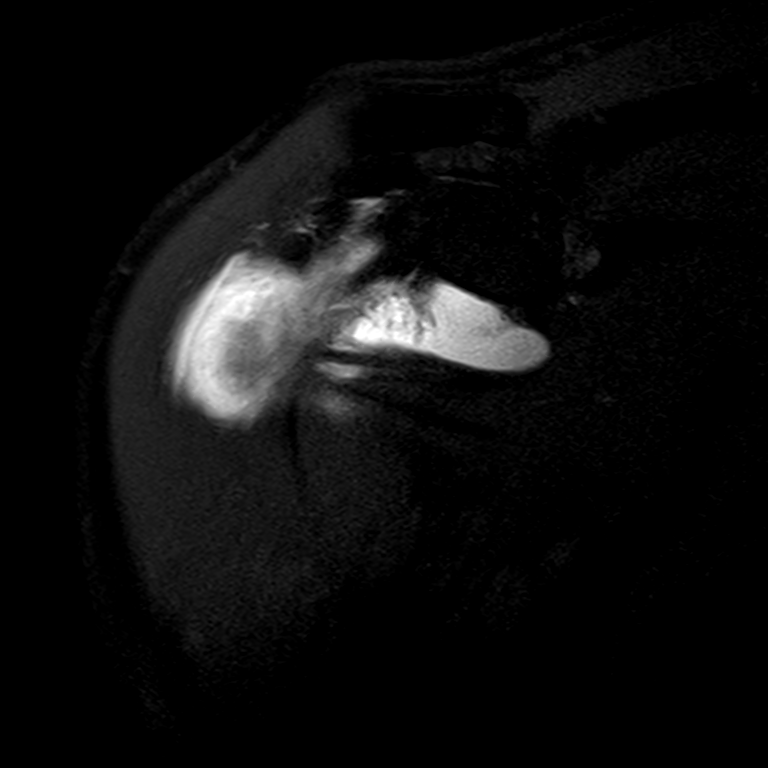
[im 14/23]
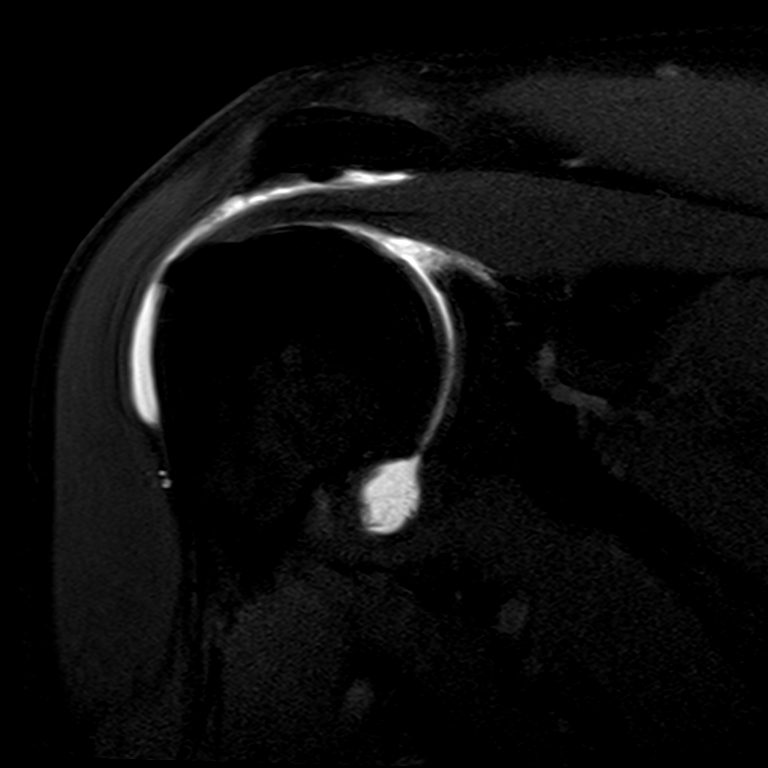
[im 23/23]
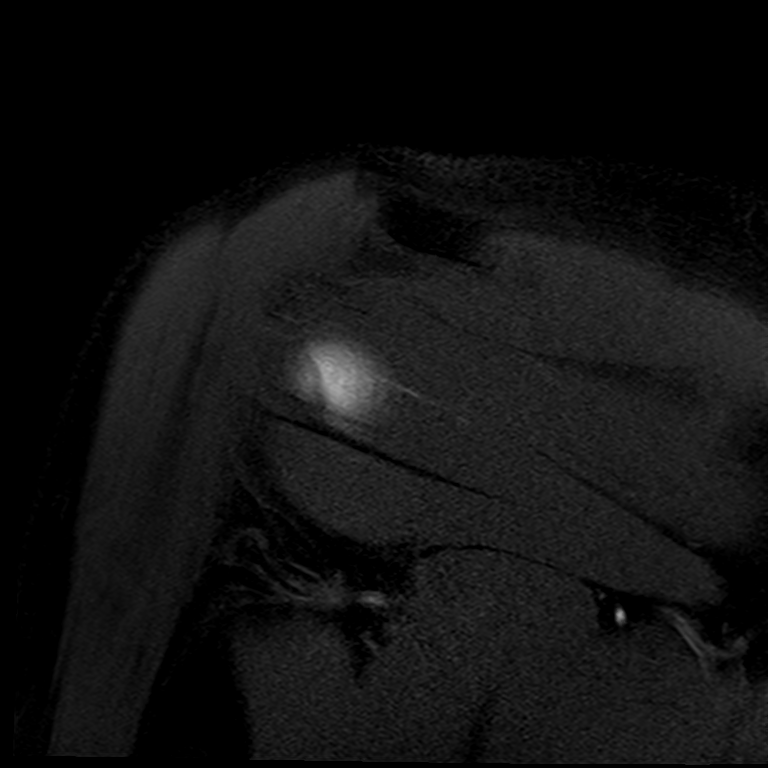

[Series 11: T2 fat-sat · oblique · right · 3.0mm · 0.22mm/px · 3 of 23 slices shown (2 of 2)]
[im 5/23]
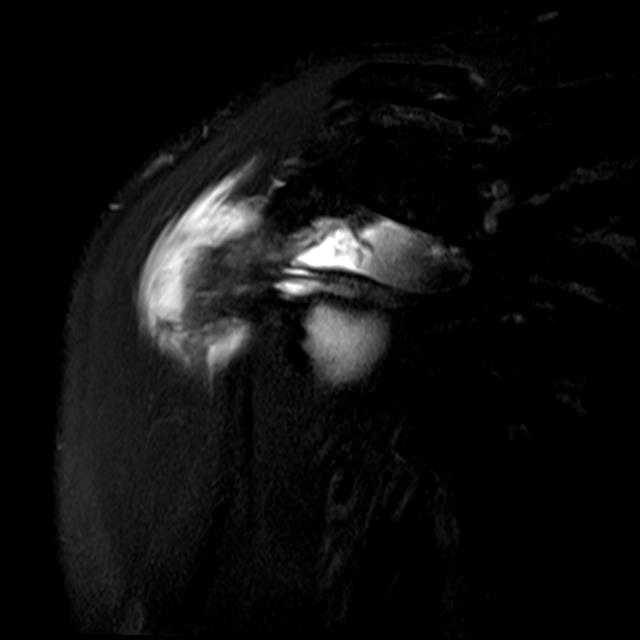
[im 14/23]
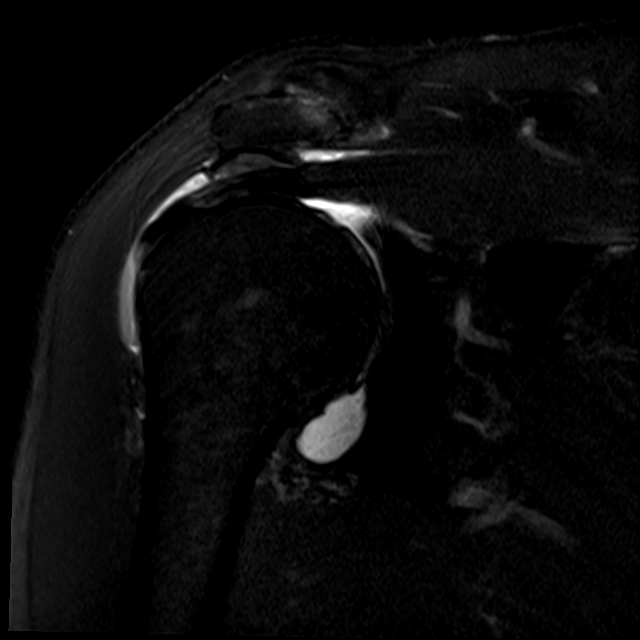
[im 23/23]
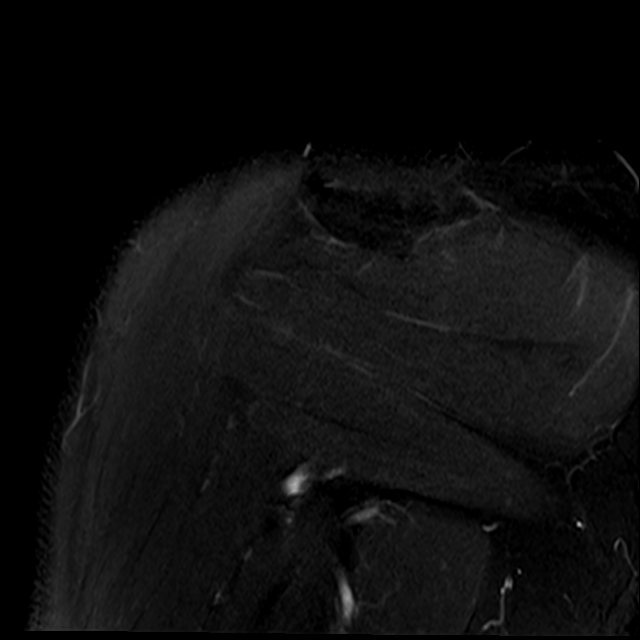

[13 of 40 positions shown; findings below may reference images not displayed]

FINDINGS: Rotator cuff: Large full-thickness tear of the anterior
supraspinatus tendon measuring 14 mm in anterior- posterior
dimension with 2.2 cm of retraction. Infraspinatus tendon is intact.
Teres minor tendon is intact. Moderate tendinosis of the
subscapularis tendon with a high-grade partial-thickness tear of the
superior fibers.

Muscles: No atrophy or fatty replacement of nor abnormal signal
within, the muscles of the rotator cuff.

Biceps long head: Moderate tendinosis of the intraarticular portion
of the long head of biceps tendon with a partial-thickness tear.

Acromioclavicular Joint: Severe arthropathy of the acromioclavicular
joint. Type I acromion. Large amount of contrast in the
subacromial/subdeltoid bursa.

Glenohumeral Joint: Intraarticular contrast distending the joint
capsule. Partial-thickness cartilage loss of the glenohumeral joint.
Intact glenohumeral ligaments.

Labrum: Superior labral degeneration.

Bones: No acute osseous abnormality. No fracture or dislocation. No
marrow signal abnormality.
IMPRESSION: 1. Large full-thickness tear of the anterior supraspinatus tendon
measuring 14 mm in anterior- posterior dimension with 2.2 cm of
retraction.
2. Moderate tendinosis of the subscapularis tendon with a high-grade
partial-thickness tear of the superior fibers.
3. Moderate tendinosis of the intraarticular portion of the long
head of biceps tendon with a partial-thickness tear.
4. Severe arthropathy of the acromioclavicular joint.

## 2018-02-02 DIAGNOSIS — M75121 Complete rotator cuff tear or rupture of right shoulder, not specified as traumatic: Secondary | ICD-10-CM | POA: Diagnosis not present

## 2018-02-08 DIAGNOSIS — R69 Illness, unspecified: Secondary | ICD-10-CM | POA: Diagnosis not present

## 2018-02-08 DIAGNOSIS — K136 Irritative hyperplasia of oral mucosa: Secondary | ICD-10-CM | POA: Diagnosis not present

## 2018-02-11 ENCOUNTER — Other Ambulatory Visit: Payer: Self-pay | Admitting: Orthopedic Surgery

## 2018-02-11 DIAGNOSIS — M25511 Pain in right shoulder: Secondary | ICD-10-CM

## 2018-02-12 ENCOUNTER — Ambulatory Visit
Admission: RE | Admit: 2018-02-12 | Discharge: 2018-02-12 | Disposition: A | Discharge status: Home / Self Care | Payer: Medicare HMO | Source: Ambulatory Visit | Attending: Orthopedic Surgery | Admitting: Orthopedic Surgery

## 2018-02-12 DIAGNOSIS — M25511 Pain in right shoulder: Secondary | ICD-10-CM

## 2018-02-12 DIAGNOSIS — M75121 Complete rotator cuff tear or rupture of right shoulder, not specified as traumatic: Secondary | ICD-10-CM | POA: Diagnosis not present

## 2018-02-20 DIAGNOSIS — M75121 Complete rotator cuff tear or rupture of right shoulder, not specified as traumatic: Secondary | ICD-10-CM | POA: Diagnosis not present

## 2018-03-12 ENCOUNTER — Ambulatory Visit (INDEPENDENT_AMBULATORY_CARE_PROVIDER_SITE_OTHER): Payer: Medicare HMO | Admitting: Internal Medicine

## 2018-03-12 ENCOUNTER — Encounter: Payer: Self-pay | Admitting: Internal Medicine

## 2018-03-12 VITALS — BP 130/80 | HR 78 | Temp 98.1°F | Ht 70.0 in | Wt 190.0 lb

## 2018-03-12 DIAGNOSIS — L298 Other pruritus: Secondary | ICD-10-CM | POA: Diagnosis not present

## 2018-03-12 DIAGNOSIS — I10 Essential (primary) hypertension: Secondary | ICD-10-CM

## 2018-03-12 LAB — COMPREHENSIVE METABOLIC PANEL
ALBUMIN: 4.5 g/dL (ref 3.5–5.2)
ALT: 21 U/L (ref 0–53)
AST: 18 U/L (ref 0–37)
Alkaline Phosphatase: 58 U/L (ref 39–117)
BUN: 8 mg/dL (ref 6–23)
CALCIUM: 9.5 mg/dL (ref 8.4–10.5)
CHLORIDE: 103 meq/L (ref 96–112)
CO2: 30 meq/L (ref 19–32)
CREATININE: 0.83 mg/dL (ref 0.40–1.50)
GFR: 92.82 mL/min (ref >60.00)
Glucose, Bld: 91 mg/dL (ref 70–99)
Potassium: 3.7 mEq/L (ref 3.5–5.1)
Sodium: 139 mEq/L (ref 135–145)
Total Bilirubin: 1.5 mg/dL — ABNORMAL HIGH (ref 0.2–1.2)
Total Protein: 7.3 g/dL (ref 6.0–8.3)

## 2018-03-12 LAB — CBC
HCT: 44.3 % (ref 39.0–52.0)
Hemoglobin: 15.3 g/dL (ref 13.0–17.0)
MCHC: 34.5 g/dL (ref 30.0–36.0)
MCV: 87.4 fl (ref 78.0–100.0)
Platelets: 280 10*3/uL (ref 150.0–400.0)
RBC: 5.07 Mil/uL (ref 4.22–5.81)
RDW: 12.9 % (ref 11.5–15.5)
WBC: 5.6 10*3/uL (ref 4.0–10.5)

## 2018-03-12 LAB — T4, FREE: FREE T4: 0.81 ng/dL (ref 0.60–1.60)

## 2018-03-12 NOTE — Progress Notes (Signed)
Subjective:    Patient ID: Dakota Branch, male    DOB: 09-06-1950, 68 y.o.   MRN: [id number]  HPI Here due to ongoing itching  Now has rash and itching on arms Itching on chest also--but no clear rash Worse after showering  Did see a dermatologist after my last visit--did feel it was chiggers Got prednisone--not very helpful Leg rash mostly gone now  No antihistamines Using OTC topical product--helps (chigarid--camphor, menthol, etc)  Current Outpatient Medications on File Prior to Visit  Medication Sig Dispense Refill  . amLODipine (NORVASC) 5 MG tablet TAKE 1 TABLET BY MOUTH ONCE DAILY 90 tablet 3  . sildenafil (REVATIO) 20 MG tablet TAKE 3 TO 5 TABLETS BY MOUTH DAILY AS NEEDED 50 tablet 11   No current facility-administered medications on file prior to visit.     Allergies  Allergen Reactions  . Aspirin     REACTION: toxic reaction as a child--but this is unclear  . Lisinopril-Hydrochlorothiazide     REACTION: itching    Past Medical History:  Diagnosis Date  . Colon polyps   . ED (erectile dysfunction)   . GERD (gastroesophageal reflux disease)   . Hyperlipidemia   . Hypertension   . OSA (obstructive sleep apnea)     Past Surgical History:  Procedure Laterality Date  . hepatitis ( prob A)  1978    Family History  Problem Relation Age of Onset  . Hydrocephalus Mother        shunt  . Arthritis Mother   . Diabetes Mother   . Hypertension Father   . Heart attack Maternal Grandmother   . Diabetes Maternal Aunt   . Breast cancer Sister   . Colon cancer Neg Hx   . Esophageal cancer Neg Hx   . Rectal cancer Neg Hx   . Stomach cancer Neg Hx     Social History   Socioeconomic History  . Marital status: Married    Spouse name: Not on file  . Number of children: 4  . Years of education: Not on file  . Highest education level: Not on file  Occupational History  . Occupation: owner    Comment: Paramedic  . Financial resource  strain: Not on file  . Food insecurity:    Worry: Not on file    Inability: Not on file  . Transportation needs:    Medical: Not on file    Non-medical: Not on file  Tobacco Use  . Smoking status: Former Smoker    Types: Cigarettes  . Smokeless tobacco: Never Used  . Tobacco comment: Never everyday smoker, "bummed" when in college  Substance and Sexual Activity  . Alcohol use: Yes    Comment: rare  . Drug use: No  . Sexual activity: Yes    Birth control/protection: Abstinence  Lifestyle  . Physical activity:    Days per week: Not on file    Minutes per session: Not on file  . Stress: Not on file  Relationships  . Social connections:    Talks on phone: Not on file    Gets together: Not on file    Attends religious service: Not on file    Active member of club or organization: Not on file    Attends meetings of clubs or organizations: Not on file    Relationship status: Not on file  . Intimate partner violence:    Fear of current or ex partner: Not on file    Emotionally  abused: Not on file    Physically abused: Not on file    Forced sexual activity: Not on file  Other Topics Concern  . Not on file  Social History Narrative   Pt is a Teacher, early years/pre.   Likes to do film production--hopes to get into this more.   Review of Systems No fever Not sick No OTC supplements    Objective:   Physical Exam  Constitutional: He appears well-developed. No distress.  Skin:  Non specific maculopapular rash on forearms Scattered small red papules on legs (but only a few)           Assessment & Plan:

## 2018-03-12 NOTE — Assessment & Plan Note (Signed)
Still scattered papules on legs---but it doesn't make sense that this could still be chiggers Some forearm itching as well---with non specific rash Didn't respond to cortisone creams Using OTC chigarid with some improvement  Will try evening cetirizine Back to derm if persists

## 2018-03-12 NOTE — Patient Instructions (Signed)
Please try cetirizine (zyrtec) 10mg  every evening to see if that helps the itching.

## 2018-03-12 NOTE — Assessment & Plan Note (Signed)
BP Readings from Last 3 Encounters:  03/12/18 130/80  10/03/17 122/74  08/30/17 124/78   Has been fine Will check labs now--just for reassurance given the itching

## 2018-03-18 ENCOUNTER — Telehealth: Payer: Self-pay | Admitting: Internal Medicine

## 2018-03-18 MED ORDER — AMLODIPINE BESYLATE 5 MG PO TABS: 5.0000 mg | ORAL_TABLET | Freq: Every day | ORAL | 3 refills | Status: DC

## 2018-03-18 NOTE — Telephone Encounter (Signed)
Pt need refill for    Amlodipine   Sent to  [facility]

## 2018-04-11 ENCOUNTER — Other Ambulatory Visit: Payer: BC Managed Care – PPO

## 2018-04-16 ENCOUNTER — Encounter: Payer: BC Managed Care – PPO | Admitting: Internal Medicine

## 2018-04-17 ENCOUNTER — Telehealth: Payer: Self-pay | Admitting: Internal Medicine

## 2018-04-17 MED ORDER — SILDENAFIL CITRATE 20 MG PO TABS: ORAL_TABLET | ORAL | 2 refills | Status: DC

## 2018-04-17 NOTE — Telephone Encounter (Signed)
Best number [phone] Pt called to get a refill on sildenafil 20mg    [facility]  Pt is out of his meds

## 2018-07-24 ENCOUNTER — Other Ambulatory Visit: Payer: Self-pay

## 2018-07-24 ENCOUNTER — Ambulatory Visit (INDEPENDENT_AMBULATORY_CARE_PROVIDER_SITE_OTHER): Payer: Medicare HMO | Admitting: Internal Medicine

## 2018-07-24 ENCOUNTER — Encounter: Payer: Self-pay | Admitting: Internal Medicine

## 2018-07-24 ENCOUNTER — Telehealth: Payer: Self-pay

## 2018-07-24 DIAGNOSIS — Z91038 Other insect allergy status: Secondary | ICD-10-CM | POA: Diagnosis not present

## 2018-07-24 MED ORDER — PREDNISONE 20 MG PO TABS: 40.0000 mg | ORAL_TABLET | Freq: Every day | ORAL | 0 refills | Status: DC

## 2018-07-24 NOTE — Telephone Encounter (Signed)
I spoke with pt; pt did not go to [facility]. Pt said he applied cold pack to lt hand and arm. Area is warm to touch and still swelling in hand and arm; pt was stung on back side of hand on 07/22/18. No difficulty breathing and no swelling in mouth, throat or tongue. Pt has no covid symptoms, no travel and no known exposure to + covid. Pt scheduled in office visit today at 11:15. FYI to Dr Silvio Pate.

## 2018-07-24 NOTE — Assessment & Plan Note (Signed)
I don't think this is infected Discussed ice Will give 5 days of prednisone Take the cetirizine

## 2018-07-24 NOTE — Telephone Encounter (Signed)
[facility] Night - Client TELEPHONE ADVICE RECORD [facility] Patient Name: Dakota Branch Gender: Male DOB: February 26, 1950 Age: 68 Y 11 M 10 D Return Phone Number: [phone] (Primary), [phone] (Secondary) Address: City/State/Zip: [place] [address] Client [facility] Night - Client Client Site [place] Physician Viviana Simpler - MD Contact Type Call Who Is Calling Patient / Member / Family / Caregiver Call Type Triage / Clinical Relationship To Patient Self Return Phone Number [phone] (Primary) Chief Complaint Swelling (generalized) Reason for Call Symptomatic / Request for [place] states he got stung by a bee yesterday and his hand/arm is now swollen. Translation No Nurse Assessment Nurse: Alveta Heimlich, RN, Santiago Glad Date/Time Eilene Ghazi Time): 07/23/2018 6:57:24 PM Confirm and document reason for call. If symptomatic, describe symptoms. ---Caller states he got stung by a bee yesterday and his left hand/ arm is now swollen. Caller states no fever no shortness of breath or blistering or drainage Has the patient had close contact with a person known or suspected to have the novel coronavirus illness OR traveled / lives in area with major community spread (including international travel) in the last 14 days from the onset of symptoms? * If Asymptomatic, screen for exposure and travel within the last 14 days. ---No Does the patient have any new or worsening symptoms? ---Yes Will a triage be completed? ---Yes Related visit to physician within the last 2 weeks? ---No Does the PT have any chronic conditions? (i.e. diabetes, asthma, this includes High risk factors for pregnancy, etc.) ---No Is this a behavioral health or substance abuse call? ---No Guidelines Guideline Title Affirmed Question Affirmed Notes Nurse Date/Time (Eastern Time) Bee or Yellow  Jacket Sting Swelling is huge (e.g., > 4 inches or 10 cm, spreads beyond wrist or ankle) Alveta Heimlich, RN, Santiago Glad 07/23/2018 6:59:35 PM Disp. Time Eilene Ghazi Time) Disposition Final User 07/23/2018 7:05:36 PM See PCP within 24 Hours Yes Alveta Heimlich, RN, Santiago Glad PLEASE NOTE: All timestamps contained within this report are represented as [place] Standard Time. CONFIDENTIALTY NOTICE: This fax transmission is intended only for the addressee. It contains information that is legally privileged, confidential or otherwise protected from use or disclosure. If you are not the intended recipient, you are strictly prohibited from reviewing, disclosing, copying using or disseminating any of this information or taking any action in reliance on or regarding this information. If you have received this fax in error, please notify us immediately by telephone so that we can arrange for its return to [place]. Phone: [phone], Toll-Free: [phone], Fax: [phone] Page: 2 of 2 Call Id: [id number] [place] Disagree/Comply Comply Caller Understands Yes PreDisposition Did not know what to do Care Advice Given Per Guideline SEE PCP WITHIN 24 HOURS: * IF OFFICE WILL BE OPEN: You need to be seen within the next 24 hours. Call your doctor (or NP/PA) when the office opens and make an appointment. * IF OFFICE WILL BE CLOSED AND NO PCP (PRIMARY CARE PROVIDER) SECOND-LEVEL TRIAGE: You need to be seen within the next 24 hours. A clinic or an urgent care center is often a good source of care if your doctor's office is closed or you can't get an appointment. PAIN MEDICINES: * For pain relief, take acetaminophen, ibuprofen, or naproxen. * Use the lowest amount that makes your pain feel better. ACETAMINOPHEN (E.G., TYLENOL): * Take 650 mg (two 325 mg pills) by mouth every 4-6 hours as needed. Each Regular Strength Tylenol pill has 325 mg  of acetaminophen. The most you should take each day is 3,250 mg (10 Regular Strength pills a day). * For swelling  that involves the hand, remove any rings on the fingers. REMOVE RINGS: * For swelling that involves the foot, remove any rings on the toes. CALL BACK IF: * Fever occurs * You become worse. CARE ADVICE given per Bee or Yellow Jacket Sting (Adult) guideline. Comments User: Nolberto Hanlon, RN Date/Time Eilene Ghazi Time): 07/23/2018 7:05:16 PM Caller states unable to take Benadryl or Claritian is allergic to both Referrals REFERRED TO PCP OFFICE

## 2018-07-24 NOTE — Telephone Encounter (Signed)
Probably an allergic reaction See note

## 2018-07-24 NOTE — Patient Instructions (Signed)
Please try ice on your hand. Use the daily cetirizine 10mg  for the next few days--as well as the prednisone prescription. Let me know if it gets redder or more painful--it could indicate a secondary infection.

## 2018-07-24 NOTE — Progress Notes (Signed)
Subjective:    Patient ID: Dakota Branch, male    DOB: January 13, 1950, 68 y.o.   MRN: [id number]  HPI Here due to insect bite  Bit by bee or hornet 2 days ago On dorsum of left hand Has been swollen---since yesterday Some itching but not much pain now (hurt bad right after though)  Hasn't done anything for it--other than OTC ointment   Current Outpatient Medications on File Prior to Visit  Medication Sig Dispense Refill  . amLODipine (NORVASC) 5 MG tablet Take 1 tablet (5 mg total) by mouth daily. 90 tablet 3  . sildenafil (REVATIO) 20 MG tablet TAKE 3 TO 5 TABLETS BY MOUTH DAILY AS NEEDED 50 tablet 2   No current facility-administered medications on file prior to visit.     Allergies  Allergen Reactions  . Aspirin     REACTION: toxic reaction as a child--but this is unclear  . Lisinopril-Hydrochlorothiazide     REACTION: itching    Past Medical History:  Diagnosis Date  . Colon polyps   . ED (erectile dysfunction)   . GERD (gastroesophageal reflux disease)   . Hyperlipidemia   . Hypertension   . OSA (obstructive sleep apnea)     Past Surgical History:  Procedure Laterality Date  . hepatitis ( prob A)  1978    Family History  Problem Relation Age of Onset  . Hydrocephalus Mother        shunt  . Arthritis Mother   . Diabetes Mother   . Hypertension Father   . Heart attack Maternal Grandmother   . Diabetes Maternal Aunt   . Breast cancer Sister   . Colon cancer Neg Hx   . Esophageal cancer Neg Hx   . Rectal cancer Neg Hx   . Stomach cancer Neg Hx     Social History   Socioeconomic History  . Marital status: Married    Spouse name: Not on file  . Number of children: 4  . Years of education: Not on file  . Highest education level: Not on file  Occupational History  . Occupation: owner    Comment: Paramedic  . Financial resource strain: Not on file  . Food insecurity    Worry: Not on file    Inability: Not on file  .  Transportation needs    Medical: Not on file    Non-medical: Not on file  Tobacco Use  . Smoking status: Former Smoker    Types: Cigarettes  . Smokeless tobacco: Never Used  . Tobacco comment: Never everyday smoker, "bummed" when in college  Substance and Sexual Activity  . Alcohol use: Yes    Comment: rare  . Drug use: No  . Sexual activity: Yes    Birth control/protection: Abstinence  Lifestyle  . Physical activity    Days per week: Not on file    Minutes per session: Not on file  . Stress: Not on file  Relationships  . Social Herbalist on phone: Not on file    Gets together: Not on file    Attends religious service: Not on file    Active member of club or organization: Not on file    Attends meetings of clubs or organizations: Not on file    Relationship status: Not on file  . Intimate partner violence    Fear of current or ex partner: Not on file    Emotionally abused: Not on file    Physically  abused: Not on file    Forced sexual activity: Not on file  Other Topics Concern  . Not on file  Social History Narrative   Pt is a Teacher, early years/pre.   Likes to do film production--hopes to get into this more.   Review of Systems His other rash has mostly calmed down--"after a year" No fever No generalized rash, oral symptoms, etc Has some other spots to be checked    Objective:   Physical Exam  Constitutional: He appears well-developed. No distress.  Musculoskeletal:     Comments: Left hand moderately swollen Mildly warm--but not much different than other hand Stinger site has scab Mild redness but not really tender  Skin:  seb keratosis in left hairline and benign moles on back           Assessment & Plan:

## 2018-08-26 ENCOUNTER — Telehealth: Payer: Self-pay

## 2018-08-26 NOTE — Telephone Encounter (Signed)
Pt left v/m requesting cb; I spoke with pt; over the past weekend pt was at [facility], [place]. Pt was out in the woods and saw many deer. when pt got home after shower pts wife saw something black on side of pt near the back. Pt's wife removed tick; pt thinks got the head of the tick when removed. Pt said was red where tick was attached but no rash, swelling, bullseye, no fever H/A or irritation. Pt then told me had already cb and spoke with Morey Hummingbird who spoke with Dr Silvio Pate and was advised if develops fever, H/A, irritation, rash or bullseye to call [facility] for appt. Pt will watch for 5 - 10 days but also advised could be 3- 4 wks prior to showing symptoms. Pt voiced understanding.noting further needed. FYI to Dr Silvio Pate.

## 2018-08-26 NOTE — Telephone Encounter (Signed)
Yes---reviewed instructions with Morey Hummingbird when he had called

## 2018-08-29 ENCOUNTER — Other Ambulatory Visit: Payer: Self-pay

## 2018-08-29 ENCOUNTER — Encounter: Payer: Self-pay | Admitting: Internal Medicine

## 2018-08-29 ENCOUNTER — Ambulatory Visit (INDEPENDENT_AMBULATORY_CARE_PROVIDER_SITE_OTHER): Payer: Medicare HMO | Admitting: Internal Medicine

## 2018-08-29 VITALS — BP 122/76 | HR 85 | Temp 98.3°F | Ht 69.5 in | Wt 188.0 lb

## 2018-08-29 DIAGNOSIS — Z23 Encounter for immunization: Secondary | ICD-10-CM | POA: Diagnosis not present

## 2018-08-29 DIAGNOSIS — Z91038 Other insect allergy status: Secondary | ICD-10-CM

## 2018-08-29 MED ORDER — TRIAMCINOLONE ACETONIDE 0.1 % EX CREA: 1.0000 "application " | TOPICAL_CREAM | Freq: Two times a day (BID) | CUTANEOUS | 1 refills | Status: DC | PRN

## 2018-08-29 NOTE — Assessment & Plan Note (Signed)
Didn't have ants Severe for chiggers but he may be sensitive He had similar trouble last year---and it only recently cleared up completely Prednisone didn't help much last year---so will just try topical (unless he worsens)  Reassured about the tick ---no evidence of infection

## 2018-08-29 NOTE — Progress Notes (Signed)
Subjective:    Patient ID: Dakota Branch, male    DOB: 10-16-1950, 68 y.o.   MRN: [id number]  HPI Here due to skin changes and a tick bite  Has lesions on right arm and a few on left hand/shoulder One on leg   Went to [facility]--his own place. 4 days ago Mowed the grass and then went into the woods Lesions started the next day Mild itching--not painful Progressing mildly Tried chigger liquid--"Chigarid"---has helped some  Found tick on left flank---was itchy Wife removed tick--seemed to get all of it Mild rash around that  Current Outpatient Medications on File Prior to Visit  Medication Sig Dispense Refill  . amLODipine (NORVASC) 5 MG tablet Take 1 tablet (5 mg total) by mouth daily. 90 tablet 3  . sildenafil (REVATIO) 20 MG tablet TAKE 3 TO 5 TABLETS BY MOUTH DAILY AS NEEDED 50 tablet 2   No current facility-administered medications on file prior to visit.     Allergies  Allergen Reactions  . Aspirin     REACTION: toxic reaction as a child--but this is unclear  . Lisinopril-Hydrochlorothiazide     REACTION: itching    Past Medical History:  Diagnosis Date  . Colon polyps   . ED (erectile dysfunction)   . GERD (gastroesophageal reflux disease)   . Hyperlipidemia   . Hypertension   . OSA (obstructive sleep apnea)     Past Surgical History:  Procedure Laterality Date  . hepatitis ( prob A)  1978    Family History  Problem Relation Age of Onset  . Hydrocephalus Mother        shunt  . Arthritis Mother   . Diabetes Mother   . Hypertension Father   . Heart attack Maternal Grandmother   . Diabetes Maternal Aunt   . Breast cancer Sister   . Colon cancer Neg Hx   . Esophageal cancer Neg Hx   . Rectal cancer Neg Hx   . Stomach cancer Neg Hx     Social History   Socioeconomic History  . Marital status: Married    Spouse name: Not on file  . Number of children: 4  . Years of education: Not on file  . Highest education level: Not on file   Occupational History  . Occupation: owner    Comment: Paramedic  . Financial resource strain: Not on file  . Food insecurity    Worry: Not on file    Inability: Not on file  . Transportation needs    Medical: Not on file    Non-medical: Not on file  Tobacco Use  . Smoking status: Former Smoker    Types: Cigarettes  . Smokeless tobacco: Never Used  . Tobacco comment: Never everyday smoker, "bummed" when in college  Substance and Sexual Activity  . Alcohol use: Yes    Comment: rare  . Drug use: No  . Sexual activity: Yes    Birth control/protection: Abstinence  Lifestyle  . Physical activity    Days per week: Not on file    Minutes per session: Not on file  . Stress: Not on file  Relationships  . Social Herbalist on phone: Not on file    Gets together: Not on file    Attends religious service: Not on file    Active member of club or organization: Not on file    Attends meetings of clubs or organizations: Not on file  Relationship status: Not on file  . Intimate partner violence    Fear of current or ex partner: Not on file    Emotionally abused: Not on file    Physically abused: Not on file    Forced sexual activity: Not on file  Other Topics Concern  . Not on file  Social History Narrative   Pt is a Teacher, early years/pre.   Likes to do film production--hopes to get into this more.    Review of Systems No fever Not sick    Objective:   Physical Exam  Constitutional: He appears well-developed. No distress.  Skin:  Small indurated area on left posterior flank. Not inflamed or tender. No surrounding rash  Distinct single vesicles largely up right arm---but scattered on left hand/wrist and 1 on trunk           Assessment & Plan:

## 2018-11-11 ENCOUNTER — Encounter: Payer: Self-pay | Admitting: Internal Medicine

## 2018-11-11 ENCOUNTER — Other Ambulatory Visit: Payer: Self-pay

## 2018-11-11 ENCOUNTER — Ambulatory Visit (INDEPENDENT_AMBULATORY_CARE_PROVIDER_SITE_OTHER): Payer: Medicare HMO | Admitting: Internal Medicine

## 2018-11-11 VITALS — BP 128/80 | HR 71 | Temp 98.4°F | Ht 69.0 in | Wt 188.0 lb

## 2018-11-11 DIAGNOSIS — K219 Gastro-esophageal reflux disease without esophagitis: Secondary | ICD-10-CM

## 2018-11-11 DIAGNOSIS — L298 Other pruritus: Secondary | ICD-10-CM | POA: Diagnosis not present

## 2018-11-11 DIAGNOSIS — M7541 Impingement syndrome of right shoulder: Secondary | ICD-10-CM | POA: Diagnosis not present

## 2018-11-11 DIAGNOSIS — Z0000 Encounter for general adult medical examination without abnormal findings: Secondary | ICD-10-CM | POA: Diagnosis not present

## 2018-11-11 DIAGNOSIS — Z91038 Other insect allergy status: Secondary | ICD-10-CM | POA: Diagnosis not present

## 2018-11-11 DIAGNOSIS — I10 Essential (primary) hypertension: Secondary | ICD-10-CM

## 2018-11-11 DIAGNOSIS — Z7189 Other specified counseling: Secondary | ICD-10-CM

## 2018-11-11 DIAGNOSIS — Z23 Encounter for immunization: Secondary | ICD-10-CM | POA: Diagnosis not present

## 2018-11-11 NOTE — Assessment & Plan Note (Signed)
I have personally reviewed the Medicare Annual Wellness questionnaire and have noted 1. The patient's medical and social history 2. Their use of alcohol, tobacco or illicit drugs 3. Their current medications and supplements 4. The patient's functional ability including ADL's, fall risks, home safety risks and hearing or visual             impairment. 5. Diet and physical activities 6. Evidence for depression or mood disorders  The patients weight, height, BMI and visual acuity have been recorded in the chart I have made referrals, counseling and provided education to the patient based review of the above and I have provided the pt with a written personalized care plan for preventive services.  I have provided you with a copy of your personalized plan for preventive services. Please take the time to review along with your updated medication list.  Had flu vaccine already Will give prevnar (and pneumovax next year) shingrix at pharmacy Colon due 2023 Defer PSA to next year Discussed more regular exercise

## 2018-11-11 NOTE — Assessment & Plan Note (Signed)
Still has decreased ROM and can't lift heavy weights Doesn't appear to be worth trying surgery at this point

## 2018-11-11 NOTE — Addendum Note (Signed)
Addended by: Pilar Grammes on: 11/11/2018 04:31 PM   Modules accepted: Orders

## 2018-11-11 NOTE — Progress Notes (Signed)
Subjective:    Patient ID: Dakota Branch, male    DOB: 1950-07-01, 68 y.o.   MRN: [id number]  HPI Here for initial Medicare preventative exam and follow up of chronic health conditions Reviewed advanced directives Reviewed other doctors--his son is his dentist, Dr Rexene Alberts, Dr Virgel Paling, Dr Charlann Boxer (sports med), Dr Dorise Bullion specialist, Dr Tamera Punt-- ortho No tobacco products Occasional alcohol No regular exercise---does bike some No hospitalizations or surgery in the past year Vision is fine----some floaters Hearing is good No falls No depression or anhedonia Independent with instrumental ADLs. 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 amLODipine (NORVASC) 5 MG tablet Take 1 tablet (5 mg total) by mouth daily. 90 tablet 3  . sildenafil (REVATIO) 20 MG tablet TAKE 3 TO 5 TABLETS BY MOUTH DAILY AS NEEDED 50 tablet 2  . triamcinolone cream (KENALOG) 0.1 % Apply 1 application topically 2 (two) times daily as needed. 45 g 1   No current facility-administered medications on file prior to visit.     Allergies  Allergen Reactions  . Aspirin     REACTION: toxic reaction as a child--but this is unclear  . Lisinopril-Hydrochlorothiazide     REACTION: itching    Past Medical History:  Diagnosis Date  . Colon polyps   . ED (erectile dysfunction)   . GERD (gastroesophageal reflux disease)   . Hyperlipidemia   . Hypertension   . OSA (obstructive sleep apnea)     Past Surgical History:  Procedure  Laterality Date  . hepatitis ( prob A)  1978    Family History  Problem Relation Age of Onset  . Hydrocephalus Mother        shunt  . Arthritis Mother   . Diabetes Mother   . Hypertension Father   . Heart attack Maternal Grandmother   . Diabetes Maternal Aunt   . Breast cancer Sister   . Colon cancer Neg Hx   . Esophageal cancer Neg Hx   . Rectal cancer Neg Hx   . Stomach cancer Neg Hx     Social History   Socioeconomic History  . Marital status: Married    Spouse name: Not on file  . Number of children: 4  . Years of education: Not on file  . Highest education level: Not on file  Occupational History  . Occupation: owner    Comment: Paramedic  . Financial resource strain: Not on file  . Food insecurity    Worry: Not on file    Inability: Not on file  . Transportation needs    Medical: Not on file    Non-medical: Not on file  Tobacco Use  . Smoking status: Former Smoker    Types: Cigarettes  . Smokeless tobacco: Never Used  . Tobacco comment: Never everyday smoker, "bummed" when in college  Substance and Sexual Activity  . Alcohol use: Yes    Comment: rare  . Drug use: No  . Sexual activity: Yes  Birth control/protection: Abstinence  Lifestyle  . Physical activity    Days per week: Not on file    Minutes per session: Not on file  . Stress: Not on file  Relationships  . Social Herbalist on phone: Not on file    Gets together: Not on file    Attends religious service: Not on file    Active member of club or organization: Not on file    Attends meetings of clubs or organizations: Not on file    Relationship status: Not on file  . Intimate partner violence    Fear of current or ex partner: Not on file    Emotionally abused: Not on file    Physically abused: Not on file    Forced sexual activity: Not on file  Other Topics Concern  . Not on file  Social History Narrative   Pt is a Teacher, early years/pre.   Likes to do  film production--hopes to get into this more.    Review of Systems Appetite is fine Weight stable Sleeps well---has never tolerated CPAP (but may use it when he is with other people---to control his snoring. Snoring is better with tooth guard) Wears seat belt Bowels are fine--no blood No urinary urgency and flow is fine. No regular nocturia Sildenafil still helps--but causes some dizziness No sig back or joint pain. Still with shoulder pain--but has held off on surgery No headaches Teeth okay---keeps up with dentist    Objective:   Physical Exam  Constitutional: He is oriented to person, place, and time. He appears well-developed. No distress.  HENT:  Mouth/Throat: Oropharynx is clear and moist. No oropharyngeal exudate.  Neck: No thyromegaly present.  Cardiovascular: Normal rate, regular rhythm, normal heart sounds and intact distal pulses. Exam reveals no gallop.  No murmur heard. Respiratory: Effort normal and breath sounds normal. No respiratory distress. He has no wheezes. He has no rales.  GI: Soft. There is no abdominal tenderness.  Musculoskeletal:        General: No tenderness or edema.  Lymphadenopathy:    He has no cervical adenopathy.  Neurological: He is alert and oriented to person, place, and time.  President--- "Dwaine Deter, Bush" [id number] Recall 2/3  Skin: No rash noted. No erythema.  Psychiatric: He has a normal mood and affect. His behavior is normal.           Assessment & Plan:

## 2018-11-11 NOTE — Assessment & Plan Note (Signed)
Mostly better Has slight lesion over right ribs where he noted pain--not sure if this is related

## 2018-11-11 NOTE — Patient Instructions (Signed)
Please get the shingrix vaccine at your pharmacy anytime after December 9th.

## 2018-11-11 NOTE — Assessment & Plan Note (Signed)
Mild symptoms now No regular meds

## 2018-11-11 NOTE — Assessment & Plan Note (Signed)
BP Readings from Last 3 Encounters:  11/11/18 128/80  08/29/18 122/76  07/24/18 110/70   Good control No change needed

## 2018-11-11 NOTE — Progress Notes (Signed)
Hearing Screening   Method: Audiometry   125Hz  250Hz  500Hz  1000Hz  2000Hz  3000Hz  4000Hz  6000Hz  8000Hz   Right ear:   20 20 20  20     Left ear:   20 25 20  20       Visual Acuity Screening   Right eye Left eye Both eyes  Without correction:     With correction: 20/15 20/15 20/13

## 2018-11-11 NOTE — Assessment & Plan Note (Signed)
See social history 

## 2019-01-30 ENCOUNTER — Ambulatory Visit: Payer: Medicare HMO

## 2019-02-03 ENCOUNTER — Ambulatory Visit: Payer: Medicare HMO | Attending: Internal Medicine

## 2019-02-03 DIAGNOSIS — Z23 Encounter for immunization: Secondary | ICD-10-CM | POA: Insufficient documentation

## 2019-02-03 NOTE — Progress Notes (Signed)
   Covid-19 Vaccination Clinic  Name:  Saul Lawery    MRN: [id number] DOB: [birth year]  02/03/2019  Mr. Appling was observed post Covid-19 immunization for 15 minutes without incidence. He was provided with Vaccine Information Sheet and instruction to access the V-Safe system.   Mr. Aylesworth was instructed to call 911 with any severe reactions post vaccine: Marland Kitchen Difficulty breathing  . Swelling of your face and throat  . A fast heartbeat  . A bad rash all over your body  . Dizziness and weakness    Immunizations Administered    Name Date Dose VIS Date Route   Pfizer COVID-19 Vaccine 02/03/2019 10:51 AM 0.3 mL 12/13/2018 Intramuscular   Manufacturer: [place]   Lot: [id number]   [place]: [id number]

## 2019-02-04 ENCOUNTER — Ambulatory Visit: Payer: Medicare HMO

## 2019-02-07 ENCOUNTER — Ambulatory Visit: Payer: Medicare HMO

## 2019-02-25 ENCOUNTER — Ambulatory Visit: Payer: Medicare HMO

## 2019-02-26 ENCOUNTER — Ambulatory Visit: Payer: Medicare HMO

## 2019-02-27 ENCOUNTER — Ambulatory Visit: Payer: Medicare HMO | Attending: Internal Medicine

## 2019-02-27 DIAGNOSIS — Z23 Encounter for immunization: Secondary | ICD-10-CM | POA: Insufficient documentation

## 2019-02-27 NOTE — Progress Notes (Signed)
   Covid-19 Vaccination Clinic  Name:  Dakota Branch    MRN: [id number] DOB: 29-Jan-1950  02/27/2019  Dakota Branch was observed post Covid-19 immunization for 15 minutes without incidence. He was provided with Vaccine Information Sheet and instruction to access the V-Safe system.   Dakota Branch was instructed to call 911 with any severe reactions post vaccine: Marland Kitchen Difficulty breathing  . Swelling of your face and throat  . A fast heartbeat  . A bad rash all over your body  . Dizziness and weakness    Immunizations Administered    Name Date Dose VIS Date Route   Pfizer COVID-19 Vaccine 02/27/2019 11:02 AM 0.3 mL 12/13/2018 Intramuscular   Manufacturer: [place]   Lot: [id number]   [place]: [id number]

## 2019-04-02 ENCOUNTER — Other Ambulatory Visit: Payer: Self-pay | Admitting: Internal Medicine

## 2019-05-13 ENCOUNTER — Encounter: Payer: Self-pay | Admitting: Internal Medicine

## 2019-05-13 ENCOUNTER — Ambulatory Visit (INDEPENDENT_AMBULATORY_CARE_PROVIDER_SITE_OTHER): Payer: Medicare HMO | Admitting: Internal Medicine

## 2019-05-13 ENCOUNTER — Other Ambulatory Visit: Payer: Self-pay

## 2019-05-13 VITALS — BP 130/78 | HR 63 | Temp 97.5°F | Ht 69.0 in | Wt 184.0 lb

## 2019-05-13 DIAGNOSIS — Z125 Encounter for screening for malignant neoplasm of prostate: Secondary | ICD-10-CM

## 2019-05-13 DIAGNOSIS — R200 Anesthesia of skin: Secondary | ICD-10-CM | POA: Diagnosis not present

## 2019-05-13 DIAGNOSIS — I10 Essential (primary) hypertension: Secondary | ICD-10-CM

## 2019-05-13 LAB — COMPREHENSIVE METABOLIC PANEL
ALT: 27 U/L (ref 0–53)
AST: 21 U/L (ref 0–37)
Albumin: 4.3 g/dL (ref 3.5–5.2)
Alkaline Phosphatase: 64 U/L (ref 39–117)
BUN: 14 mg/dL (ref 6–23)
CO2: 31 mEq/L (ref 19–32)
Calcium: 9.3 mg/dL (ref 8.4–10.5)
Chloride: 101 mEq/L (ref 96–112)
Creatinine, Ser: 0.81 mg/dL (ref 0.40–1.50)
GFR: 95.12 mL/min (ref >60.00)
Glucose, Bld: 114 mg/dL — ABNORMAL HIGH (ref 70–99)
Potassium: 4.2 mEq/L (ref 3.5–5.1)
Sodium: 137 mEq/L (ref 135–145)
Total Bilirubin: 2.1 mg/dL — ABNORMAL HIGH (ref 0.2–1.2)
Total Protein: 6.9 g/dL (ref 6.0–8.3)

## 2019-05-13 LAB — CBC
HCT: 41.7 % (ref 39.0–52.0)
Hemoglobin: 14.3 g/dL (ref 13.0–17.0)
MCHC: 34.4 g/dL (ref 30.0–36.0)
MCV: 87.6 fl (ref 78.0–100.0)
Platelets: 265 10*3/uL (ref 150.0–400.0)
RBC: 4.76 Mil/uL (ref 4.22–5.81)
RDW: 12.4 % (ref 11.5–15.5)
WBC: 6.5 10*3/uL (ref 4.0–10.5)

## 2019-05-13 LAB — T4, FREE: Free T4: 0.78 ng/dL (ref 0.60–1.60)

## 2019-05-13 LAB — PSA, MEDICARE: PSA: 0.55 ng/ml (ref 0.10–4.00)

## 2019-05-13 LAB — VITAMIN B12: Vitamin B-12: 415 pg/mL (ref 211–911)

## 2019-05-13 NOTE — Patient Instructions (Signed)
Please get a good stability shoe (like at [employer] in [place]) to wear when you are working

## 2019-05-13 NOTE — Assessment & Plan Note (Signed)
BP Readings from Last 3 Encounters:  05/13/19 130/78  11/11/18 128/80  08/29/18 122/76   Good control on med --amlodipine No changes needed

## 2019-05-13 NOTE — Assessment & Plan Note (Addendum)
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

## 2019-05-13 NOTE — Progress Notes (Signed)
Subjective:    Patient ID: Dakota Branch, male    DOB: 1950-02-22, 69 y.o.   MRN: [id number]  HPI Here due to sensory changes in feet This visit occurred during the SARS-CoV-2 public health emergency.  Safety protocols were in place, including screening questions prior to the visit, additional usage of staff PPE, and extensive cleaning of exam room while observing appropriate contact time as indicated for disinfecting solutions.   Numbness on plantar feet--mostly on left Notices it mostly in the car No symptoms in hands--but has noticed slight muscle pains in left forearm and left calf just today Numbness started around 2 weeks ago  Current Outpatient Medications on File Prior to Visit  Medication Sig Dispense Refill  . amLODipine (NORVASC) 5 MG tablet TAKE 1 TABLET BY MOUTH EVERY DAY 90 tablet 3  . sildenafil (REVATIO) 20 MG tablet TAKE 3 TO 5 TABLETS BY MOUTH DAILY AS NEEDED 50 tablet 2  . triamcinolone cream (KENALOG) 0.1 % Apply 1 application topically 2 (two) times daily as needed. 45 g 1   No current facility-administered medications on file prior to visit.    Allergies  Allergen Reactions  . Aspirin     REACTION: toxic reaction as a child--but this is unclear  . Lisinopril-Hydrochlorothiazide     REACTION: itching    Past Medical History:  Diagnosis Date  . Colon polyps   . ED (erectile dysfunction)   . GERD (gastroesophageal reflux disease)   . Hyperlipidemia   . Hypertension   . OSA (obstructive sleep apnea)     Past Surgical History:  Procedure Laterality Date  . hepatitis ( prob A)  1978    Family History  Problem Relation Age of Onset  . Hydrocephalus Mother        shunt  . Arthritis Mother   . Diabetes Mother   . Hypertension Father   . Heart attack Maternal Grandmother   . Diabetes Maternal Aunt   . Breast cancer Sister   . Colon cancer Neg Hx   . Esophageal cancer Neg Hx   . Rectal cancer Neg Hx   . Stomach cancer Neg Hx     Social  History   Socioeconomic History  . Marital status: Married    Spouse name: Not on file  . Number of children: 4  . Years of education: Not on file  . Highest education level: Not on file  Occupational History  . Occupation: owner    Comment: Event organiser  Tobacco Use  . Smoking status: Former Smoker    Types: Cigarettes  . Smokeless tobacco: Never Used  . Tobacco comment: Never everyday smoker, "bummed" when in college  Substance and Sexual Activity  . Alcohol use: Yes    Comment: rare  . Drug use: No  . Sexual activity: Yes    Birth control/protection: Abstinence  Other Topics Concern  . Not on file  Social History Narrative   Pt is a Teacher, early years/pre.      Has living will    Wife is health care POA---alternate would be children   Would accept resuscitation   Would accept tube feeds   Social Determinants of Health   Financial Resource Strain:   . Difficulty of Paying Living Expenses:   Food Insecurity:   . Worried About Charity fundraiser in the Last Year:   . Arboriculturist in the Last Year:   Transportation Needs:   . Film/video editor (Medical):   Marland Kitchen  Lack of Transportation (Non-Medical):   Physical Activity:   . Days of Exercise per Week:   . Minutes of Exercise per Session:   Stress:   . Feeling of Stress :   Social Connections:   . Frequency of Communication with Friends and Family:   . Frequency of Social Gatherings with Friends and Family:   . Attends Religious Services:   . Active Member of Clubs or Organizations:   . Attends Archivist Meetings:   Marland Kitchen Marital Status:   Intimate Partner Violence:   . Fear of Current or Ex-Partner:   . Emotionally Abused:   Marland Kitchen Physically Abused:   . Sexually Abused:    Review of Systems Eating okay No excessive thirst or urination No chest pain or SOB Voiding fine Some constipation--uses prune juice as needed    Objective:   Physical Exam  Constitutional: He appears well-developed. No  distress.  Neck: No thyromegaly present.  Cardiovascular: Normal rate, regular rhythm, normal heart sounds and intact distal pulses. Exam reveals no gallop.  No murmur heard. Respiratory: Effort normal and breath sounds normal. No respiratory distress. He has no wheezes. He has no rales.  GI: Soft. There is no abdominal tenderness.  Musculoskeletal:        General: No tenderness or edema.  Lymphadenopathy:    He has no cervical adenopathy.  Neurological:  Normal fine touch sensation in feet  Skin: No rash noted.  Psychiatric: He has a normal mood and affect. His behavior is normal.           Assessment & Plan:

## 2019-05-16 ENCOUNTER — Telehealth: Payer: Self-pay | Admitting: Internal Medicine

## 2019-05-16 ENCOUNTER — Other Ambulatory Visit: Payer: Self-pay | Admitting: Internal Medicine

## 2019-05-16 DIAGNOSIS — D472 Monoclonal gammopathy: Secondary | ICD-10-CM

## 2019-05-16 LAB — PROTEIN ELECTROPHORESIS, SERUM, WITH REFLEX
Albumin ELP: 4.1 g/dL (ref 3.8–4.8)
Alpha 1: 0.2 g/dL (ref 0.2–0.3)
Alpha 2: 0.5 g/dL (ref 0.5–0.9)
Beta 2: 0.7 g/dL — ABNORMAL HIGH (ref 0.2–0.5)
Beta Globulin: 0.4 g/dL (ref 0.4–0.6)
Gamma Globulin: 0.8 g/dL (ref 0.8–1.7)
Total Protein: 6.7 g/dL (ref 6.1–8.1)

## 2019-05-16 LAB — IFE INTERPRETATION: Immunofix Electr Int: DETECTED

## 2019-05-16 NOTE — Telephone Encounter (Signed)
See results note. 

## 2019-05-16 NOTE — Telephone Encounter (Signed)
Dakota Branch pt is returning your call and would like to be called back at the number on file.

## 2019-05-19 ENCOUNTER — Telehealth: Payer: Self-pay | Admitting: Hematology and Oncology

## 2019-05-19 NOTE — Telephone Encounter (Signed)
Received a new hem referral from Dr. Silvio Pate for monoclonal gammopathy. Mr. Dakota Branch has been cld and scheduled to see Dr. Lindi Adie on 5/19 at 10am. Pt aware to arrive 15 minutes early.

## 2019-05-20 DIAGNOSIS — D472 Monoclonal gammopathy: Secondary | ICD-10-CM | POA: Insufficient documentation

## 2019-05-20 NOTE — Assessment & Plan Note (Signed)
IgA kappa monoclonal protein (work-up was performed for neuropathy evaluation) M protein was not measurable No evidence of multiple myeloma.  Creatinine 0.81, calcium 9.3, albumin 4.3, hemoglobin 14.3  Counseling: I discussed with the patient the spectrum of disorders from MGUS to multiple myeloma. We discussed the role of plasma cells in producing immunoglobulins. We discussed structure of immunoglobulins on how they make up the heavy chains and the light chains. The light chains are Kappa and lambda. I discussed the difference between MGUS and multiple myeloma. MGUS is characterized by elevation monoclonal protein without any end organ damage. Multiple myeloma is associated with elevation monoclonal protein and end organ damage (hypercalcemia, renal dysfunction, anemia, bone lytic lesions ) along with a bone marrow showing greater than 10% plasma cells.  Recommendation: Follow-up with serum protein electrophoresis, serum free light chains in 6 months.

## 2019-05-20 NOTE — Progress Notes (Signed)
[facility] CONSULT NOTE  Patient Care Team: Venia Carbon, MD as PCP - General  CHIEF COMPLAINTS/PURPOSE OF CONSULTATION:  Newly diagnosed MGUS  HISTORY OF PRESENTING ILLNESS:  Dakota Branch 69 y.o. male is here because of recent diagnosis of MGUS. Labs on 05/13/19 showed Hg 14.3, platelets 265, beta 2 0.7. He presents to the clinic today for initial evaluation.  The work-up was initiated when he complained of some mild neuropathy in the bottom of the feet.  Serum protein after pheresis was performed which showed an IgA kappa monoclonal protein but it was not measurable.  He has been doing quite well other than hypertension no other medical problems.  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  I reviewed her records extensively and collaborated the history with the patient.  MEDICAL HISTORY:  Past Medical History:  Diagnosis Date  . Colon polyps   . ED (erectile dysfunction)   . GERD (gastroesophageal reflux disease)   . Hyperlipidemia   . Hypertension   . OSA (obstructive sleep apnea)     SURGICAL HISTORY: Past Surgical History:  Procedure Laterality Date  . hepatitis ( prob A)  1978    SOCIAL HISTORY: Social History   Socioeconomic History  . Marital status: Married    Spouse name: Not on file  . Number of children: 4  . Years of education: Not on file  . Highest education level: Not on file  Occupational History  . Occupation: owner    Comment: Event organiser  Tobacco Use  . Smoking status: Former Smoker    Types: Cigarettes  . Smokeless tobacco: Never Used  . Tobacco comment: Never everyday smoker, "bummed" when in college  Substance and Sexual Activity  . Alcohol use: Yes    Comment: rare  . Drug use: No  . Sexual activity: Yes    Birth control/protection: Abstinence  Other Topics Concern  . Not on file  Social History Narrative   Pt is a  Teacher, early years/pre.      Has living will    Wife is health care POA---alternate would be children   Would accept resuscitation   Would accept tube feeds   Social Determinants of Health   Financial Resource Strain:   . Difficulty of Paying Living Expenses:   Food Insecurity:   . Worried About Charity fundraiser in the Last Year:   . Arboriculturist in the Last Year:   Transportation Needs:   . Film/video editor (Medical):   Marland Kitchen Lack of Transportation (Non-Medical):   Physical Activity:   . Days of Exercise per Week:   . Minutes of Exercise per Session:   Stress:   . Feeling of Stress :   Social Connections:   . Frequency of Communication with Friends and Family:   . Frequency of Social Gatherings with Friends and Family:   . Attends Religious Services:   . Active Member of Clubs or Organizations:   . Attends Archivist Meetings:   Marland Kitchen Marital Status:   Intimate Partner Violence:   . Fear of Current or Ex-Partner:   . Emotionally Abused:   Marland Kitchen Physically Abused:   . Sexually Abused:     FAMILY HISTORY: Family History  Problem Relation Age of Onset  . Hydrocephalus Mother        shunt  . Arthritis Mother   . Diabetes Mother   .  Hypertension Father   . Heart attack Maternal Grandmother   . Diabetes Maternal Aunt   . Breast cancer Sister   . Colon cancer Neg Hx   . Esophageal cancer Neg Hx   . Rectal cancer Neg Hx   . Stomach cancer Neg Hx     ALLERGIES:  is allergic to aspirin and lisinopril-hydrochlorothiazide.  MEDICATIONS:  Current Outpatient Medications  Medication Sig Dispense Refill  . amLODipine (NORVASC) 5 MG tablet TAKE 1 TABLET BY MOUTH EVERY DAY 90 tablet 3  . sildenafil (REVATIO) 20 MG tablet TAKE 3 TO 5 TABLETS BY MOUTH DAILY AS NEEDED 50 tablet 2  . triamcinolone cream (KENALOG) 0.1 % Apply 1 application topically 2 (two) times daily as needed. 45 g 1   No current facility-administered medications for this visit.    REVIEW OF  SYSTEMS:   Constitutional: Denies fevers, chills or abnormal night sweats Eyes: Denies blurriness of vision, double vision or watery eyes Ears, nose, mouth, throat, and face: Denies mucositis or sore throat Respiratory: Denies cough, dyspnea or wheezes Cardiovascular: Denies palpitation, chest discomfort or lower extremity swelling Gastrointestinal:  Denies nausea, heartburn or change in bowel habits Skin: Denies abnormal skin rashes Lymphatics: Denies new lymphadenopathy or easy bruising Neurological:Denies numbness, tingling or new weaknesses Behavioral/Psych: Mood is stable, no new changes  All other systems were reviewed with the patient and are negative.  PHYSICAL EXAMINATION: ECOG PERFORMANCE STATUS: 1 - Symptomatic but completely ambulatory  Vitals:   05/21/19 1009  BP: (!) 149/74  Pulse: 81  Resp: 17  Temp: 99.1 F (37.3 C)  SpO2: 99%   Filed Weights   05/21/19 1009  Weight: 180 lb 8 oz (81.9 kg)    GENERAL:alert, no distress and comfortable SKIN: skin color, texture, turgor are normal, no rashes or significant lesions EYES: normal, conjunctiva are pink and non-injected, sclera clear OROPHARYNX:no exudate, no erythema and lips, buccal mucosa, and tongue normal  NECK: supple, thyroid normal size, non-tender, without nodularity LYMPH:  no palpable lymphadenopathy in the cervical, axillary or inguinal LUNGS: clear to auscultation and percussion with normal breathing effort HEART: regular rate & rhythm and no murmurs and no lower extremity edema ABDOMEN:abdomen soft, non-tender and normal bowel sounds Musculoskeletal:no cyanosis of digits and no clubbing  PSYCH: alert & oriented x 3 with fluent speech NEURO: no focal motor/sensory deficits  LABORATORY DATA:  I have reviewed the data as listed Lab Results  Component Value Date   WBC 6.5 05/13/2019   HGB 14.3 05/13/2019   HCT 41.7 05/13/2019   MCV 87.6 05/13/2019   PLT 265.0 05/13/2019   Lab Results  Component  Value Date   NA 137 05/13/2019   K 4.2 05/13/2019   CL 101 05/13/2019   CO2 31 05/13/2019    RADIOGRAPHIC STUDIES: I have personally reviewed the radiological reports and agreed with the findings in the report.  ASSESSMENT AND PLAN:  MGUS (monoclonal gammopathy of unknown significance) IgA kappa monoclonal protein (work-up was performed for neuropathy evaluation) M protein was not measurable No evidence of multiple myeloma.  Creatinine 0.81, calcium 9.3, albumin 4.3, hemoglobin 14.3  Counseling: I discussed with the patient the spectrum of disorders from MGUS to multiple myeloma. We discussed the role of plasma cells in producing immunoglobulins. We discussed structure of immunoglobulins on how they make up the heavy chains and the light chains. The light chains are Kappa and lambda. I discussed the difference between MGUS and multiple myeloma. MGUS is characterized by elevation monoclonal protein  without any end organ damage. Multiple myeloma is associated with elevation monoclonal protein and end organ damage (hypercalcemia, renal dysfunction, anemia, bone lytic lesions ) along with a bone marrow showing greater than 10% plasma cells.  Recommendation: Follow-up with serum protein electrophoresis, serum free light chains in 6 months. Bone survey will be needed for bone evaluation.  We will call the patient after the bone survey is done.  Patient is big-time into movie production and cinematography.  He is planning on selling his [employer] in the interim.  All questions were answered. The patient knows to call the clinic with any problems, questions or concerns.   Rulon Eisenmenger, MD, MPH 05/21/2019    I, Molly Dorshimer, am acting as scribe for Nicholas Lose, MD.  I have reviewed the above documentation for accuracy and completeness, and I agree with the above.

## 2019-05-21 ENCOUNTER — Other Ambulatory Visit: Payer: Self-pay

## 2019-05-21 ENCOUNTER — Inpatient Hospital Stay: Payer: Medicare HMO | Attending: Hematology and Oncology | Admitting: Hematology and Oncology

## 2019-05-21 ENCOUNTER — Telehealth: Payer: Self-pay | Admitting: Hematology and Oncology

## 2019-05-21 DIAGNOSIS — Z8249 Family history of ischemic heart disease and other diseases of the circulatory system: Secondary | ICD-10-CM | POA: Diagnosis not present

## 2019-05-21 DIAGNOSIS — D473 Essential (hemorrhagic) thrombocythemia: Secondary | ICD-10-CM | POA: Diagnosis not present

## 2019-05-21 DIAGNOSIS — D472 Monoclonal gammopathy: Secondary | ICD-10-CM

## 2019-05-21 DIAGNOSIS — Z833 Family history of diabetes mellitus: Secondary | ICD-10-CM | POA: Diagnosis not present

## 2019-05-21 DIAGNOSIS — G4733 Obstructive sleep apnea (adult) (pediatric): Secondary | ICD-10-CM | POA: Diagnosis not present

## 2019-05-21 DIAGNOSIS — Z79899 Other long term (current) drug therapy: Secondary | ICD-10-CM

## 2019-05-21 DIAGNOSIS — I10 Essential (primary) hypertension: Secondary | ICD-10-CM | POA: Insufficient documentation

## 2019-05-21 DIAGNOSIS — Z87891 Personal history of nicotine dependence: Secondary | ICD-10-CM | POA: Diagnosis not present

## 2019-05-21 DIAGNOSIS — Z8261 Family history of arthritis: Secondary | ICD-10-CM | POA: Diagnosis not present

## 2019-05-21 DIAGNOSIS — E785 Hyperlipidemia, unspecified: Secondary | ICD-10-CM

## 2019-05-21 DIAGNOSIS — G629 Polyneuropathy, unspecified: Secondary | ICD-10-CM | POA: Diagnosis not present

## 2019-05-21 DIAGNOSIS — Z803 Family history of malignant neoplasm of breast: Secondary | ICD-10-CM | POA: Diagnosis not present

## 2019-05-21 DIAGNOSIS — K219 Gastro-esophageal reflux disease without esophagitis: Secondary | ICD-10-CM

## 2019-05-21 NOTE — Telephone Encounter (Signed)
Scheduled per 05/19 los, called patient and left a voicemail.

## 2019-05-26 ENCOUNTER — Encounter: Payer: Self-pay | Admitting: Hematology and Oncology

## 2019-05-30 ENCOUNTER — Ambulatory Visit (HOSPITAL_COMMUNITY)
Admission: RE | Admit: 2019-05-30 | Discharge: 2019-05-30 | Disposition: A | Discharge status: Home / Self Care | Payer: Medicare HMO | Source: Ambulatory Visit | Attending: Hematology and Oncology | Admitting: Hematology and Oncology

## 2019-05-30 ENCOUNTER — Other Ambulatory Visit: Payer: Self-pay

## 2019-05-30 DIAGNOSIS — D472 Monoclonal gammopathy: Secondary | ICD-10-CM

## 2019-05-30 DIAGNOSIS — C9000 Multiple myeloma not having achieved remission: Secondary | ICD-10-CM | POA: Diagnosis not present

## 2019-06-03 ENCOUNTER — Telehealth: Payer: Self-pay | Admitting: *Deleted

## 2019-06-03 NOTE — Telephone Encounter (Signed)
Per Wilber Bihari NP, called and left vmail on pt personal cell phone to make him aware that his bone scan showed no metastatic disease. Advised if there were any other question to call office

## 2019-06-04 ENCOUNTER — Encounter: Payer: Self-pay | Admitting: Internal Medicine

## 2019-06-04 ENCOUNTER — Ambulatory Visit (INDEPENDENT_AMBULATORY_CARE_PROVIDER_SITE_OTHER): Payer: Medicare HMO | Admitting: Internal Medicine

## 2019-06-04 ENCOUNTER — Other Ambulatory Visit: Payer: Self-pay

## 2019-06-04 VITALS — BP 130/84 | HR 65 | Temp 97.8°F | Ht 69.0 in | Wt 182.0 lb

## 2019-06-04 DIAGNOSIS — I700 Atherosclerosis of aorta: Secondary | ICD-10-CM

## 2019-06-04 DIAGNOSIS — M542 Cervicalgia: Secondary | ICD-10-CM

## 2019-06-04 DIAGNOSIS — D472 Monoclonal gammopathy: Secondary | ICD-10-CM | POA: Diagnosis not present

## 2019-06-04 NOTE — Progress Notes (Signed)
Subjective:    Patient ID: Dakota Branch, male    DOB: December 04, 1950, 69 y.o.   MRN: [id number]  HPI Here to follow up on hematology appointment for [facility] This visit occurred during the SARS-CoV-2 public health emergency.  Safety protocols were in place, including screening questions prior to the visit, additional usage of staff PPE, and extensive cleaning of exam room while observing appropriate contact time as indicated for disinfecting solutions.   Had concerns about about the skeletal survey Degenerative changes in spine and shoulders He does have some arthritis pain He notes some neck pain--especially left side Goes back a couple of months Notices it most if tilting neck to left He has not done anything for it (not even heating pad)  Also noted atherosclerosis of aorta on skeletal survey Also seen on abd CT 2 years ago  Current Outpatient Medications on File Prior to Visit  Medication Sig Dispense Refill  . amLODipine (NORVASC) 5 MG tablet TAKE 1 TABLET BY MOUTH EVERY DAY 90 tablet 3  . sildenafil (REVATIO) 20 MG tablet TAKE 3 TO 5 TABLETS BY MOUTH DAILY AS NEEDED 50 tablet 2  . triamcinolone cream (KENALOG) 0.1 % Apply 1 application topically 2 (two) times daily as needed. 45 g 1   No current facility-administered medications on file prior to visit.    Allergies  Allergen Reactions  . Aspirin     REACTION: toxic reaction as a child--but this is unclear  . Lisinopril-Hydrochlorothiazide     REACTION: itching    Past Medical History:  Diagnosis Date  . Colon polyps   . ED (erectile dysfunction)   . GERD (gastroesophageal reflux disease)   . Hyperlipidemia   . Hypertension   . OSA (obstructive sleep apnea)     Past Surgical History:  Procedure Laterality Date  . hepatitis ( prob A)  1978    Family History  Problem Relation Age of Onset  . Hydrocephalus Mother        shunt  . Arthritis Mother   . Diabetes Mother   . Hypertension Father   . Heart attack  Maternal Grandmother   . Diabetes Maternal Aunt   . Breast cancer Sister   . Colon cancer Neg Hx   . Esophageal cancer Neg Hx   . Rectal cancer Neg Hx   . Stomach cancer Neg Hx     Social History   Socioeconomic History  . Marital status: Married    Spouse name: Not on file  . Number of children: 4  . Years of education: Not on file  . Highest education level: Not on file  Occupational History  . Occupation: owner    Comment: Event organiser  Tobacco Use  . Smoking status: Former Smoker    Types: Cigarettes  . Smokeless tobacco: Never Used  . Tobacco comment: Never everyday smoker, "bummed" when in college  Substance and Sexual Activity  . Alcohol use: Yes    Comment: rare  . Drug use: No  . Sexual activity: Yes    Birth control/protection: Abstinence  Other Topics Concern  . Not on file  Social History Narrative   Pt is a Teacher, early years/pre.      Has living will    Wife is health care POA---alternate would be children   Would accept resuscitation   Would accept tube feeds   Social Determinants of Health   Financial Resource Strain:   . Difficulty of Paying Living Expenses:   Food Insecurity:   .  Worried About Charity fundraiser in the Last Year:   . Arboriculturist in the Last Year:   Transportation Needs:   . Film/video editor (Medical):   Marland Kitchen Lack of Transportation (Non-Medical):   Physical Activity:   . Days of Exercise per Week:   . Minutes of Exercise per Session:   Stress:   . Feeling of Stress :   Social Connections:   . Frequency of Communication with Friends and Family:   . Frequency of Social Gatherings with Friends and Family:   . Attends Religious Services:   . Active Member of Clubs or Organizations:   . Attends Archivist Meetings:   Marland Kitchen Marital Status:   Intimate Partner Violence:   . Fear of Current or Ex-Partner:   . Emotionally Abused:   Marland Kitchen Physically Abused:   . 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 No distress.  Neck:  Marked limitation in extension---flexion only mildly reduced Tilt and rotation fairly normal Pain spot is trapezius insertion to occiput on left           Assessment & Plan:

## 2019-06-04 NOTE — Patient Instructions (Signed)
Please try tylenol (like 650mg  three times a day) for the arthritis and neck.  You should use heat regularly on your neck when it hurts--and consider an over the counter lidocaine patch for 12 hours a day. You can try glucosamine/chondroitin for the arthritis as well (give a 1 month trial). [employer] has a good one  For the calcifications, you can consider trying a cholesterol medication (atorvastatin 20mg  would be what I would use)   DASH Eating Plan DASH stands for "Dietary Approaches to Stop Hypertension." The DASH eating plan is a healthy eating plan that has been shown to reduce high blood pressure (hypertension). It may also reduce your risk for type 2 diabetes, heart disease, and stroke. The DASH eating plan may also help with weight loss. What are tips for following this plan?  General guidelines  Avoid eating more than 2,300 mg (milligrams) of salt (sodium) a day. If you have hypertension, you may need to reduce your sodium intake to 1,500 mg a day.  Limit alcohol intake to no more than 1 drink a day for nonpregnant women and 2 drinks a day for men. One drink equals 12 oz of beer, 5 oz of wine, or 1 oz of hard liquor.  Work with your health care provider to maintain a healthy body weight or to lose weight. Ask what an ideal weight is for you.  Get at least 30 minutes of exercise that causes your heart to beat faster (aerobic exercise) most days of the week. Activities may include walking, swimming, or biking.  Work with your health care provider or diet and nutrition specialist (dietitian) to adjust your eating plan to your individual calorie needs. Reading food labels   Check food labels for the amount of sodium per serving. Choose foods with less than 5 percent of the Daily Value of sodium. Generally, foods with less than 300 mg of sodium per serving fit into this eating plan.  To find whole grains, look for the word "whole" as the first word in the ingredient  list. Shopping  Buy products labeled as "low-sodium" or "no salt added."  Buy fresh foods. Avoid canned foods and premade or frozen meals. Cooking  Avoid adding salt when cooking. Use salt-free seasonings or herbs instead of table salt or sea salt. Check with your health care provider or pharmacist before using salt substitutes.  Do not fry foods. Cook foods using healthy methods such as baking, boiling, grilling, and broiling instead.  Cook with heart-healthy oils, such as olive, canola, soybean, or sunflower oil. Meal planning  Eat a balanced diet that includes: ? 5 or more servings of fruits and vegetables each day. At each meal, try to fill half of your plate with fruits and vegetables. ? Up to 6-8 servings of whole grains each day. ? Less than 6 oz of lean meat, poultry, or fish each day. A 3-oz serving of meat is about the same size as a deck of cards. One egg equals 1 oz. ? 2 servings of low-fat dairy each day. ? A serving of nuts, seeds, or beans 5 times each week. ? Heart-healthy fats. Healthy fats called Omega-3 fatty acids are found in foods such as flaxseeds and coldwater fish, like sardines, salmon, and mackerel.  Limit how much you eat of the following: ? Canned or prepackaged foods. ? Food that is high in trans fat, such as fried foods. ? Food that is high in saturated fat, such as fatty meat. ? Sweets, desserts, sugary drinks,  and other foods with added sugar. ? Full-fat dairy products.  Do not salt foods before eating.  Try to eat at least 2 vegetarian meals each week.  Eat more home-cooked food and less restaurant, buffet, and fast food.  When eating at a restaurant, ask that your food be prepared with less salt or no salt, if possible. What foods are recommended? The items listed may not be a complete list. Talk with your dietitian about what dietary choices are best for you. Grains Whole-grain or whole-wheat bread. Whole-grain or whole-wheat pasta. Brown  rice. [place]. Bulgur. Whole-grain and low-sodium cereals. Pita bread. Low-fat, low-sodium crackers. Whole-wheat flour tortillas. Vegetables Fresh or frozen vegetables (raw, steamed, roasted, or grilled). Low-sodium or reduced-sodium tomato and vegetable juice. Low-sodium or reduced-sodium tomato sauce and tomato paste. Low-sodium or reduced-sodium canned vegetables. Fruits All fresh, dried, or frozen fruit. Canned fruit in natural juice (without added sugar). Meat and other protein foods Skinless chicken or [place]. Ground chicken or [place]. Pork with fat trimmed off. Fish and seafood. Egg whites. Dried beans, peas, or lentils. Unsalted nuts, nut butters, and seeds. Unsalted canned beans. Lean cuts of beef with fat trimmed off. Low-sodium, lean deli meat. Dairy Low-fat (1%) or fat-free (skim) milk. Fat-free, low-fat, or reduced-fat cheeses. Nonfat, low-sodium ricotta or cottage cheese. Low-fat or nonfat yogurt. Low-fat, low-sodium cheese. Fats and oils Soft margarine without trans fats. Vegetable oil. Low-fat, reduced-fat, or light mayonnaise and salad dressings (reduced-sodium). Canola, safflower, olive, soybean, and sunflower oils. Avocado. Seasoning and other foods Herbs. Spices. Seasoning mixes without salt. Unsalted popcorn and pretzels. Fat-free sweets. What foods are not recommended? The items listed may not be a complete list. Talk with your dietitian about what dietary choices are best for you. Grains Baked goods made with fat, such as croissants, muffins, or some breads. Dry pasta or rice meal packs. Vegetables Creamed or fried vegetables. Vegetables in a cheese sauce. Regular canned vegetables (not low-sodium or reduced-sodium). Regular canned tomato sauce and paste (not low-sodium or reduced-sodium). Regular tomato and vegetable juice (not low-sodium or reduced-sodium). Angie Fava. Olives. Fruits Canned fruit in a light or heavy syrup. Fried fruit. Fruit in cream or butter  sauce. Meat and other protein foods Fatty cuts of meat. Ribs. Fried meat. Berniece Salines. Sausage. Bologna and other processed lunch meats. Salami. Fatback. Hotdogs. Bratwurst. Salted nuts and seeds. Canned beans with added salt. Canned or smoked fish. Whole eggs or egg yolks. Chicken or [place] with skin. Dairy Whole or 2% milk, cream, and half-and-half. Whole or full-fat cream cheese. Whole-fat or sweetened yogurt. Full-fat cheese. Nondairy creamers. Whipped toppings. Processed cheese and cheese spreads. Fats and oils Butter. Stick margarine. Lard. Shortening. Ghee. Bacon fat. Tropical oils, such as coconut, palm kernel, or palm oil. Seasoning and other foods Salted popcorn and pretzels. Onion salt, garlic salt, seasoned salt, table salt, and sea salt. Worcestershire sauce. Tartar sauce. Barbecue sauce. Teriyaki sauce. Soy sauce, including reduced-sodium. Steak sauce. Canned and packaged gravies. Fish sauce. Oyster sauce. Cocktail sauce. Horseradish that you find on the shelf. Ketchup. Mustard. Meat flavorings and tenderizers. Bouillon cubes. Hot sauce and Tabasco sauce. Premade or packaged marinades. Premade or packaged taco seasonings. Relishes. Regular salad dressings. Where to find more information:  National Heart, Lung, and [place]: https://wilson-eaton.com/  American Heart Association: www.heart.org Summary  The DASH eating plan is a healthy eating plan that has been shown to reduce high blood pressure (hypertension). It may also reduce your risk for type 2 diabetes, heart disease, and stroke.  With the DASH eating plan, you should limit salt (sodium) intake to 2,300 mg a day. If you have hypertension, you may need to reduce your sodium intake to 1,500 mg a day.  When on the DASH eating plan, aim to eat more fresh fruits and vegetables, whole grains, lean proteins, low-fat dairy, and heart-healthy fats.  Work with your health care provider or diet and nutrition specialist (dietitian) to adjust  your eating plan to your individual calorie needs. This information is not intended to replace advice given to you by your health care provider. Make sure you discuss any questions you have with your health care provider. Document Revised: 12/01/2016 Document Reviewed: 12/13/2015 Elsevier Patient Education  2020 [employer].

## 2019-06-04 NOTE — Assessment & Plan Note (Signed)
Seems to be muscular Discussed heat, tylenol, lidocaine patch Consider PT if persists

## 2019-06-04 NOTE — Assessment & Plan Note (Signed)
Radiographic diagnosis Discussed asa (I am not excited about this) Should consider statin--he is not sure

## 2019-06-04 NOTE — Assessment & Plan Note (Signed)
Discussed this as well

## 2019-06-19 ENCOUNTER — Telehealth: Payer: Medicare HMO | Admitting: Internal Medicine

## 2019-06-19 ENCOUNTER — Other Ambulatory Visit: Payer: Self-pay

## 2019-07-07 ENCOUNTER — Other Ambulatory Visit: Payer: Self-pay | Admitting: Internal Medicine

## 2019-08-08 ENCOUNTER — Telehealth: Payer: Self-pay | Admitting: Internal Medicine

## 2019-08-08 NOTE — Telephone Encounter (Signed)
Patient called saying he has been feeling unbalanced and like his equilibrium is off. I tried triaging him but patient refused. He then finally agreed to speaking with access nurse after getting scheduled. I was able to get him on Duncan's schedule for Tuesday 8/10 at 2 PM and I then transferred him to the access nurse!

## 2019-08-11 NOTE — Telephone Encounter (Signed)
If he prefers to see me, you can switch him to my schedule at 12:30 tomorrow

## 2019-08-11 NOTE — Telephone Encounter (Signed)
Patient rescheduled appointment to 12:30 tomorrow to see Dr.Letvak.

## 2019-08-11 NOTE — Telephone Encounter (Signed)
I can see patient as scheduled if needed or okay to reschedule with PCP.  Thanks.

## 2019-08-11 NOTE — Telephone Encounter (Signed)
[facility] - Client TELEPHONE ADVICE RECORD Access[facility]urse Patient Name: Dakota Branch Gender: Male DOB: 03-28-1950 Age: 69 Y 11 M 26 D Return Phone Number: [phone] (Primary), [phone] (Secondary) Address: City/State/Zip: [place] [address] Client [facility] - Client Client Site [facility] - Day Physician Viviana Simpler- MD Contact Type Call Who Is Calling Patient / Member / Family / Caregiver Call Type Triage / Clinical Relationship To Patient Self Return Phone Number [phone] (Primary) Chief Complaint Dizziness Reason for Call Symptomatic / Request for Health Information Initial Comment Office transferring a patient to be triaged. He is experiencing dizziness and lost of balance. He said he did not ask for advice nurse. He only wanted to schedule an appointment. He does not know why the office transfeered him. He does not mind waiting for a nurse to return his call but he want an appointment Translation No Nurse Assessment Nurse: Hardin Negus, RN, Mardene Celeste Date/Time Eilene Ghazi Time): 08/08/2019 5:10:35 PM Confirm and document reason for call. If symptomatic, describe symptoms. ---He is experiencing dizziness and lost of balance. Started on Thursday and no fever He only wanted to schedule an appointment. He does not know why the office transferred him. He has an appointment for Tuesday. No ear pain or drainage from his ears Has the patient had close contact with a person known or suspected to have the novel coronavirus illness OR traveled / lives in area with major community spread (including international travel) in the last 14 days from the onset of symptoms? * If Asymptomatic, screen for exposure and travel within the last 14 days. ---No Does the patient have any new or worsening symptoms? ---No Please document clinical information provided and list any resource used. ---He only  wants an appointment. Does want to go to the UC or ER Disp. Time Eilene Ghazi Time) Disposition Final User 08/08/2019 4:56:49 PM Attempt made - no message left Thad Ranger, RN, Langley Gauss 08/08/2019 4:59:55 PM Send To RN Personal Thad Ranger, RN, Denise 08/08/2019 5:25:33 PM Clinical Call Yes Hardin Negus, RN, Lenox Ponds Understands NoPLEASE NOTE: All timestamps contained within this report are represented as [place] Standard Time. CONFIDENTIALTY NOTICE: This fax transmission is intended only for the addressee. It contains information that is legally privileged, confidential or otherwise protected from use or disclosure. If you are not the intended recipient, you are strictly prohibited from reviewing, disclosing, copying using or disseminating any of this information or taking any action in reliance on or regarding this information. If you have received this fax in error, please notify us immediately by telephone so that we can arrange for its return to [place]. Phone: [phone], Toll-Free: [phone], Fax: [phone] Page: 2 of 2 Call Id: [id number] Comments User: Romeo Apple, RN Date/Time Eilene Ghazi Time): 08/08/2019 4:56:33 PM VMB full.

## 2019-08-12 ENCOUNTER — Other Ambulatory Visit: Payer: Self-pay

## 2019-08-12 ENCOUNTER — Ambulatory Visit: Payer: Medicare HMO | Admitting: Family Medicine

## 2019-08-12 ENCOUNTER — Encounter: Payer: Self-pay | Admitting: Internal Medicine

## 2019-08-12 ENCOUNTER — Ambulatory Visit (INDEPENDENT_AMBULATORY_CARE_PROVIDER_SITE_OTHER): Payer: Medicare HMO | Admitting: Internal Medicine

## 2019-08-12 DIAGNOSIS — R42 Dizziness and giddiness: Secondary | ICD-10-CM | POA: Diagnosis not present

## 2019-08-12 MED ORDER — MECLIZINE HCL 25 MG PO TABS: 25.0000 mg | ORAL_TABLET | Freq: Three times a day (TID) | ORAL | 1 refills | Status: DC | PRN

## 2019-08-12 NOTE — Patient Instructions (Signed)
Try a neck massage for the head pain---I think it is muscle related.

## 2019-08-12 NOTE — Progress Notes (Signed)
Subjective:    Patient ID: Dakota Branch, male    DOB: [birth year], 69 y.o.   MRN: [id number]  HPI Here due to dizziness This visit occurred during the SARS-CoV-2 public health emergency.  Safety protocols were in place, including screening questions prior to the visit, additional usage of staff PPE, and extensive cleaning of exam room while observing appropriate contact time as indicated for disinfecting solutions.   Was lying in the bed 9 days ago He turned his head and had rotatory vertigo Felt a little off balance when he stood up Lasted 10 minutes Still feels slightly off balance  Never had the full vertigo again No significant prior issue with balance  Current Outpatient Medications on File Prior to Visit  Medication Sig Dispense Refill  . amLODipine (NORVASC) 5 MG tablet TAKE 1 TABLET BY MOUTH EVERY DAY 90 tablet 3  . sildenafil (REVATIO) 20 MG tablet TAKE 3 TO 5 TABLETS BY MOUTH DAILY AS NEEDED 50 tablet 2  . triamcinolone cream (KENALOG) 0.1 % APPLY 1 APPLICATION TOPICALLY 2 (TWO) TIMES DAILY AS NEEDED. 45 g 1   No current facility-administered medications on file prior to visit.    Allergies  Allergen Reactions  . Aspirin     REACTION: toxic reaction as a child--but this is unclear  . Lisinopril-Hydrochlorothiazide     REACTION: itching    Past Medical History:  Diagnosis Date  . Colon polyps   . ED (erectile dysfunction)   . GERD (gastroesophageal reflux disease)   . Hyperlipidemia   . Hypertension   . OSA (obstructive sleep apnea)     Past Surgical History:  Procedure Laterality Date  . hepatitis ( prob A)  1978    Family History  Problem Relation Age of Onset  . Hydrocephalus Mother        shunt  . Arthritis Mother   . Diabetes Mother   . Hypertension Father   . Heart attack Maternal Grandmother   . Diabetes Maternal Aunt   . Breast cancer Sister   . Colon cancer Neg Hx   . Esophageal cancer Neg Hx   . Rectal cancer Neg Hx   . Stomach  cancer Neg Hx     Social History   Socioeconomic History  . Marital status: Married    Spouse name: Not on file  . Number of children: 4  . Years of education: Not on file  . Highest education level: Not on file  Occupational History  . Occupation: owner    Comment: Event organiser  Tobacco Use  . Smoking status: Former Smoker    Types: Cigarettes  . Smokeless tobacco: Never Used  . Tobacco comment: Never everyday smoker, "bummed" when in college  Substance and Sexual Activity  . Alcohol use: Yes    Comment: rare  . Drug use: No  . Sexual activity: Yes    Birth control/protection: Abstinence  Other Topics Concern  . Not on file  Social History Narrative   Pt is a Teacher, early years/pre.      Has living will    Wife is health care POA---alternate would be children   Would accept resuscitation   Would accept tube feeds   Social Determinants of Health   Financial Resource Strain:   . Difficulty of Paying Living Expenses:   Food Insecurity:   . Worried About Charity fundraiser in the Last Year:   . Arboriculturist in the Last Year:   Transportation Needs:   .  Lack of Transportation (Medical):   Marland Kitchen Lack of Transportation (Non-Medical):   Physical Activity:   . Days of Exercise per Week:   . Minutes of Exercise per Session:   Stress:   . Feeling of Stress :   Social Connections:   . Frequency of Communication with Friends and Family:   . Frequency of Social Gatherings with Friends and Family:   . Attends Religious Services:   . Active Member of Clubs or Organizations:   . Attends Archivist Meetings:   Marland Kitchen Marital Status:   Intimate Partner Violence:   . Fear of Current or Ex-Partner:   . Emotionally Abused:   Marland Kitchen Physically Abused:   . Sexually Abused:    Review of Systems No fever or illness No tinnitus or hearing loss No weakness in arms or legs No facial droop, aphasia or dysphagia No recent travel or change in eating Weight is stable Still  gets pain bilaterally across head    Objective:   Physical Exam Eyes:     Extraocular Movements: Extraocular movements intact.     Comments: Mild horizontal nystagmus with right gaze  Neurological:     Mental Status: He is oriented to person, place, and time.     Cranial Nerves: Cranial nerves are intact.     Motor: No weakness, tremor or abnormal muscle tone.     Coordination: Romberg sign negative. Coordination normal. Finger-Nose-Finger Test normal.     Gait: Gait is intact.            Assessment & Plan:

## 2019-08-12 NOTE — Assessment & Plan Note (Signed)
Clearly vestibular No sig symptoms now No worrisome neurologic symptoms Will Rx meclizine in case he gets recurrence

## 2019-09-24 ENCOUNTER — Ambulatory Visit: Payer: Medicare HMO | Admitting: Internal Medicine

## 2019-09-24 ENCOUNTER — Encounter: Payer: Self-pay | Admitting: Internal Medicine

## 2019-09-24 ENCOUNTER — Other Ambulatory Visit: Payer: Self-pay

## 2019-09-24 ENCOUNTER — Ambulatory Visit (INDEPENDENT_AMBULATORY_CARE_PROVIDER_SITE_OTHER): Payer: Medicare HMO | Admitting: Internal Medicine

## 2019-09-24 VITALS — BP 124/84 | HR 80 | Temp 98.0°F | Ht 69.0 in | Wt 188.0 lb

## 2019-09-24 DIAGNOSIS — R2689 Other abnormalities of gait and mobility: Secondary | ICD-10-CM | POA: Insufficient documentation

## 2019-09-24 DIAGNOSIS — E785 Hyperlipidemia, unspecified: Secondary | ICD-10-CM | POA: Diagnosis not present

## 2019-09-24 DIAGNOSIS — Z23 Encounter for immunization: Secondary | ICD-10-CM

## 2019-09-24 DIAGNOSIS — Z125 Encounter for screening for malignant neoplasm of prostate: Secondary | ICD-10-CM

## 2019-09-24 DIAGNOSIS — M542 Cervicalgia: Secondary | ICD-10-CM | POA: Diagnosis not present

## 2019-09-24 NOTE — Progress Notes (Signed)
Subjective:    Patient ID: Dakota Branch, male    DOB: [birth year], 70 y.o.   MRN: [id number]  HPI Here due to persistent balance problems This visit occurred during the SARS-CoV-2 public health emergency.  Safety protocols were in place, including screening questions prior to the visit, additional usage of staff PPE, and extensive cleaning of exam room while observing appropriate contact time as indicated for disinfecting solutions.   Still having balance issues Mostly in AM when standing up---balance is off (has to hold on for stability) Eventually gets better Notes it in bed if he turns quick  Has neck pain and it causes pressure in his head Now on both sides This is fairly constant No Rx for this  Has not tried meclizine  Current Outpatient Medications on File Prior to Visit  Medication Sig Dispense Refill  . amLODipine (NORVASC) 5 MG tablet TAKE 1 TABLET BY MOUTH EVERY DAY 90 tablet 3  . meclizine (ANTIVERT) 25 MG tablet Take 1 tablet (25 mg total) by mouth 3 (three) times daily as needed. For vertigo 30 tablet 1  . sildenafil (REVATIO) 20 MG tablet TAKE 3 TO 5 TABLETS BY MOUTH DAILY AS NEEDED 50 tablet 2  . triamcinolone cream (KENALOG) 0.1 % APPLY 1 APPLICATION TOPICALLY 2 (TWO) TIMES DAILY AS NEEDED. 45 g 1   No current facility-administered medications on file prior to visit.    Allergies  Allergen Reactions  . Aspirin     REACTION: toxic reaction as a child--but this is unclear  . Lisinopril-Hydrochlorothiazide     REACTION: itching    Past Medical History:  Diagnosis Date  . Colon polyps   . ED (erectile dysfunction)   . GERD (gastroesophageal reflux disease)   . Hyperlipidemia   . Hypertension   . OSA (obstructive sleep apnea)     Past Surgical History:  Procedure Laterality Date  . hepatitis ( prob A)  1978    Family History  Problem Relation Age of Onset  . Hydrocephalus Mother        shunt  . Arthritis Mother   . Diabetes Mother   .  Hypertension Father   . Heart attack Maternal Grandmother   . Diabetes Maternal Aunt   . Breast cancer Sister   . Colon cancer Neg Hx   . Esophageal cancer Neg Hx   . Rectal cancer Neg Hx   . Stomach cancer Neg Hx     Social History   Socioeconomic History  . Marital status: Married    Spouse name: Not on file  . Number of children: 4  . Years of education: Not on file  . Highest education level: Not on file  Occupational History  . Occupation: owner    Comment: Event organiser  Tobacco Use  . Smoking status: Former Smoker    Types: Cigarettes  . Smokeless tobacco: Never Used  . Tobacco comment: Never everyday smoker, "bummed" when in college  Substance and Sexual Activity  . Alcohol use: Yes    Comment: rare  . Drug use: No  . Sexual activity: Yes    Birth control/protection: Abstinence  Other Topics Concern  . Not on file  Social History Narrative   Pt is a Teacher, early years/pre.      Has living will    Wife is health care POA---alternate would be children   Would accept resuscitation   Would accept tube feeds   Social Determinants of Health   Financial Resource Strain:   .  Difficulty of Paying Living Expenses: Not on file  Food Insecurity:   . Worried About Charity fundraiser in the Last Year: Not on file  . Ran Out of Food in the Last Year: Not on file  Transportation Needs:   . Lack of Transportation (Medical): Not on file  . Lack of Transportation (Non-Medical): Not on file  Physical Activity:   . Days of Exercise per Week: Not on file  . Minutes of Exercise per Session: Not on file  Stress:   . Feeling of Stress : Not on file  Social Connections:   . Frequency of Communication with Friends and Family: Not on file  . Frequency of Social Gatherings with Friends and Family: Not on file  . Attends Religious Services: Not on file  . Active Member of Clubs or Organizations: Not on file  . Attends Archivist Meetings: Not on file  . Marital  Status: Not on file  Intimate Partner Violence:   . Fear of Current or Ex-Partner: Not on file  . Emotionally Abused: Not on file  . Physically Abused: Not on file  . Sexually Abused: Not on file   Review of Systems Trouble walking in AM--then is fine No tinnitus No hearing loss No nausea Appetite is fine and weight stable    Objective:   Physical Exam Eyes:     Comments: Horizontal nystagmus with right gaze  Neck:     Comments: Some tightness Musculoskeletal:     Cervical back: No tenderness.  Lymphadenopathy:     Cervical: No cervical adenopathy.  Neurological:     Mental Status: He is alert and oriented to person, place, and time.     Cranial Nerves: Cranial nerves are intact.     Motor: Motor function is intact.     Coordination: Coordination is intact. Romberg sign negative. Coordination normal.     Gait: Gait is intact. Gait normal.            Assessment & Plan:

## 2019-09-24 NOTE — Assessment & Plan Note (Signed)
Still seems to be muscular--and radiates up to vertex Discussed heat and massage

## 2019-09-24 NOTE — Assessment & Plan Note (Signed)
With the nystagmus, this is consistent with vestibular disorder No neuro findings to be concerned for schwannoma or other tumor but may want to consider MRI Will start with ENT evaluation Asked him to try the meclizine at night

## 2019-09-24 NOTE — Patient Instructions (Signed)
Please try the meclizine every night before bed. You should try heat for your neck---and consider a massager as well

## 2019-10-30 ENCOUNTER — Other Ambulatory Visit: Payer: Self-pay

## 2019-10-30 ENCOUNTER — Ambulatory Visit (INDEPENDENT_AMBULATORY_CARE_PROVIDER_SITE_OTHER): Payer: Medicare HMO | Admitting: Otolaryngology

## 2019-10-30 ENCOUNTER — Encounter (INDEPENDENT_AMBULATORY_CARE_PROVIDER_SITE_OTHER): Payer: Self-pay | Admitting: Otolaryngology

## 2019-10-30 VITALS — Temp 97.2°F

## 2019-10-30 DIAGNOSIS — H6123 Impacted cerumen, bilateral: Secondary | ICD-10-CM | POA: Diagnosis not present

## 2019-10-30 DIAGNOSIS — H8111 Benign paroxysmal vertigo, right ear: Secondary | ICD-10-CM | POA: Diagnosis not present

## 2019-10-30 NOTE — Progress Notes (Signed)
HPI: Dakota Branch is a 69 y.o. male who presents is referred by Dr. Silvio Pate For evaluation of dizziness. This initially began on August 30 when he was in bed and he turned abruptly to his right and had a short episode of spinning sensation and vertigo. Following this he felt a little off balance. He had a similar episode of vertigo several weeks later. He has had no recent episodes of vertigo and no real balance problems today. He also relates that he was having some pain in the neck and the crown of his head consistent with tension type headaches. This I has done better as Dr. Silvio Pate recommended warm compresses to his neck. He is having no hearing problems. No balance problems today.  Past Medical History:  Diagnosis Date  . Colon polyps   . ED (erectile dysfunction)   . GERD (gastroesophageal reflux disease)   . Hyperlipidemia   . Hypertension   . OSA (obstructive sleep apnea)    Past Surgical History:  Procedure Laterality Date  . hepatitis ( prob A)  1978   Social History   Socioeconomic History  . Marital status: Married    Spouse name: Not on file  . Number of children: 4  . Years of education: Not on file  . Highest education level: Not on file  Occupational History  . Occupation: owner    Comment: Event organiser  Tobacco Use  . Smoking status: Never Smoker  . Smokeless tobacco: Never Used  Substance and Sexual Activity  . Alcohol use: Yes    Comment: rare  . Drug use: No  . Sexual activity: Yes    Birth control/protection: Abstinence  Other Topics Concern  . Not on file  Social History Narrative   Pt is a Teacher, early years/pre.      Has living will    Wife is health care POA---alternate would be children   Would accept resuscitation   Would accept tube feeds   Social Determinants of Health   Financial Resource Strain:   . Difficulty of Paying Living Expenses: Not on file  Food Insecurity:   . Worried About Charity fundraiser in the Last Year: Not on  file  . Ran Out of Food in the Last Year: Not on file  Transportation Needs:   . Lack of Transportation (Medical): Not on file  . Lack of Transportation (Non-Medical): Not on file  Physical Activity:   . Days of Exercise per Week: Not on file  . Minutes of Exercise per Session: Not on file  Stress:   . Feeling of Stress : Not on file  Social Connections:   . Frequency of Communication with Friends and Family: Not on file  . Frequency of Social Gatherings with Friends and Family: Not on file  . Attends Religious Services: Not on file  . Active Member of Clubs or Organizations: Not on file  . Attends Archivist Meetings: Not on file  . Marital Status: Not on file   Family History  Problem Relation Age of Onset  . Hydrocephalus Mother        shunt  . Arthritis Mother   . Diabetes Mother   . Hypertension Father   . Heart attack Maternal Grandmother   . Diabetes Maternal Aunt   . Breast cancer Sister   . Colon cancer Neg Hx   . Esophageal cancer Neg Hx   . Rectal cancer Neg Hx   . Stomach cancer Neg Hx    Allergies  Allergen Reactions  . Aspirin     REACTION: toxic reaction as a child--but this is unclear  . Lisinopril-Hydrochlorothiazide     REACTION: itching   Prior to Admission medications   Medication Sig Start Date End Date Taking? Authorizing Provider  amLODipine (NORVASC) 5 MG tablet TAKE 1 TABLET BY MOUTH EVERY DAY 04/02/19  Yes Venia Carbon, MD  meclizine (ANTIVERT) 25 MG tablet Take 1 tablet (25 mg total) by mouth 3 (three) times daily as needed. For vertigo 08/12/19  Yes Viviana Simpler I, MD  sildenafil (REVATIO) 20 MG tablet TAKE 3 TO 5 TABLETS BY MOUTH DAILY AS NEEDED 04/17/18  Yes Viviana Simpler I, MD  triamcinolone cream (KENALOG) 0.1 % APPLY 1 APPLICATION TOPICALLY 2 (TWO) TIMES DAILY AS NEEDED. 07/08/19  Yes Venia Carbon, MD     Positive ROS: Otherwise negative  All other systems have been reviewed and were otherwise negative with the  exception of those mentioned in the HPI and as above.  Physical Exam: Constitutional: Alert, well-appearing, no acute distress Ears: External ears without lesions or tenderness. He has some wax adjacent to both TMs that was cleaned in the office with a suction as he is used Q-tips in the past. TMs are clear bilaterally with good mobility on pneumatic otoscopy. On hearing screening with a 512 1024 tuning fork he heard well in both ears with AC > BC bilaterally and hearing was symmetric. On Dix-Hallpike testing he had no evidence of BPPV today. Nasal: External nose without lesions. Septum midline.. Clear nasal passages bilaterally. Oral: Lips and gums without lesions. Tongue and palate mucosa without lesions. Posterior oropharynx clear. Neck: No palpable adenopathy or masses Respiratory: Breathing comfortably  Skin: No facial/neck lesions or rash noted.  Cerumen impaction removal  Date/Time: 10/30/2019 4:31 PM Performed by: Rozetta Nunnery, MD Authorized by: Rozetta Nunnery, MD   Consent:    Consent obtained:  Verbal   Consent given by:  Patient   Risks discussed:  Pain and bleeding Procedure details:    Location:  L ear and R ear   Procedure type: suction   Post-procedure details:    Inspection:  TM intact and canal normal   Hearing quality:  Improved   Patient tolerance of procedure:  Tolerated well, no immediate complications Comments:     TMs are clear bilaterally.    Assessment: Clinical symptoms consistent with history of BPPV.  Plan: Reviewed with the patient today concerning benign positional vertigo and gave him information on BPPV and the Epley maneuver. Cautioned him about not using Q-tips in his ears. He will follow-up as needed   Radene Journey, MD   CC:

## 2019-11-07 ENCOUNTER — Other Ambulatory Visit (INDEPENDENT_AMBULATORY_CARE_PROVIDER_SITE_OTHER): Payer: Medicare HMO

## 2019-11-07 ENCOUNTER — Other Ambulatory Visit: Payer: Self-pay

## 2019-11-07 DIAGNOSIS — E785 Hyperlipidemia, unspecified: Secondary | ICD-10-CM | POA: Diagnosis not present

## 2019-11-07 DIAGNOSIS — Z125 Encounter for screening for malignant neoplasm of prostate: Secondary | ICD-10-CM | POA: Diagnosis not present

## 2019-11-07 LAB — COMPREHENSIVE METABOLIC PANEL
ALT: 24 U/L (ref 0–53)
AST: 19 U/L (ref 0–37)
Albumin: 4.3 g/dL (ref 3.5–5.2)
Alkaline Phosphatase: 71 U/L (ref 39–117)
BUN: 14 mg/dL (ref 6–23)
CO2: 31 mEq/L (ref 19–32)
Calcium: 9.2 mg/dL (ref 8.4–10.5)
Chloride: 102 mEq/L (ref 96–112)
Creatinine, Ser: 0.89 mg/dL (ref 0.40–1.50)
GFR: 88.85 mL/min (ref >60.00)
Glucose, Bld: 104 mg/dL — ABNORMAL HIGH (ref 70–99)
Potassium: 4.5 mEq/L (ref 3.5–5.1)
Sodium: 139 mEq/L (ref 135–145)
Total Bilirubin: 2 mg/dL — ABNORMAL HIGH (ref 0.2–1.2)
Total Protein: 6.7 g/dL (ref 6.0–8.3)

## 2019-11-07 LAB — LIPID PANEL
Cholesterol: 203 mg/dL — ABNORMAL HIGH (ref 0–200)
HDL: 38.2 mg/dL — ABNORMAL LOW (ref >39.00)
LDL Cholesterol: 146 mg/dL — ABNORMAL HIGH (ref 0–99)
NonHDL: 164.78
Total CHOL/HDL Ratio: 5
Triglycerides: 96 mg/dL (ref 0.0–149.0)
VLDL: 19.2 mg/dL (ref 0.0–40.0)

## 2019-11-07 LAB — CBC
HCT: 43.3 % (ref 39.0–52.0)
Hemoglobin: 14.6 g/dL (ref 13.0–17.0)
MCHC: 33.7 g/dL (ref 30.0–36.0)
MCV: 87.1 fl (ref 78.0–100.0)
Platelets: 288 10*3/uL (ref 150.0–400.0)
RBC: 4.97 Mil/uL (ref 4.22–5.81)
RDW: 12.8 % (ref 11.5–15.5)
WBC: 6.1 10*3/uL (ref 4.0–10.5)

## 2019-11-07 LAB — T4, FREE: Free T4: 0.81 ng/dL (ref 0.60–1.60)

## 2019-11-07 LAB — PSA, MEDICARE: PSA: 0.6 ng/ml (ref 0.10–4.00)

## 2019-11-14 ENCOUNTER — Other Ambulatory Visit: Payer: Self-pay

## 2019-11-14 ENCOUNTER — Encounter: Payer: Self-pay | Admitting: Internal Medicine

## 2019-11-14 ENCOUNTER — Ambulatory Visit (INDEPENDENT_AMBULATORY_CARE_PROVIDER_SITE_OTHER): Payer: Medicare HMO | Admitting: Internal Medicine

## 2019-11-14 VITALS — BP 132/74 | HR 68 | Temp 97.8°F | Ht 69.5 in | Wt 184.0 lb

## 2019-11-14 DIAGNOSIS — I10 Essential (primary) hypertension: Secondary | ICD-10-CM | POA: Diagnosis not present

## 2019-11-14 DIAGNOSIS — I700 Atherosclerosis of aorta: Secondary | ICD-10-CM

## 2019-11-14 DIAGNOSIS — Z7189 Other specified counseling: Secondary | ICD-10-CM | POA: Diagnosis not present

## 2019-11-14 DIAGNOSIS — D472 Monoclonal gammopathy: Secondary | ICD-10-CM

## 2019-11-14 DIAGNOSIS — Z0000 Encounter for general adult medical examination without abnormal findings: Secondary | ICD-10-CM | POA: Diagnosis not present

## 2019-11-14 DIAGNOSIS — Z23 Encounter for immunization: Secondary | ICD-10-CM | POA: Diagnosis not present

## 2019-11-14 MED ORDER — ROSUVASTATIN CALCIUM 20 MG PO TABS: 20.0000 mg | ORAL_TABLET | ORAL | 3 refills | Status: DC

## 2019-11-14 NOTE — Progress Notes (Signed)
Hearing Screening   125Hz  250Hz  500Hz  1000Hz  2000Hz  3000Hz  4000Hz  6000Hz  8000Hz   Right ear:           Left ear:           Comments: October 2021   Visual Acuity Screening   Right eye Left eye Both eyes  Without correction:     With correction: 20/25 20/25 20/13

## 2019-11-14 NOTE — Addendum Note (Signed)
Addended by: Pilar Grammes on: 11/14/2019 11:26 AM   Modules accepted: Orders

## 2019-11-14 NOTE — Assessment & Plan Note (Signed)
I have personally reviewed the Medicare Annual Wellness questionnaire and have noted 1. The patient's medical and social history 2. Their use of alcohol, tobacco or illicit drugs 3. Their current medications and supplements 4. The patient's functional ability including ADL's, fall risks, home safety risks and hearing or visual             impairment. 5. Diet and physical activities 6. Evidence for depression or mood disorders  The patients weight, height, BMI and visual acuity have been recorded in the chart I have made referrals, counseling and provided education to the patient based review of the above and I have provided the pt with a written personalized care plan for preventive services.  I have provided you with a copy of your personalized plan for preventive services. Please take the time to review along with your updated medication list.  Colonoscopy due in 2023 Recent PSA is fine Pneumovax today Yearly flu vaccine shingrix at pharmacy Discussed trying to exercise

## 2019-11-14 NOTE — Assessment & Plan Note (Signed)
Keeps up with Dr Lindi Adie yearly

## 2019-11-14 NOTE — Patient Instructions (Signed)
Please get the shingrix vaccine at your pharmacy (1 month from now).

## 2019-11-14 NOTE — Assessment & Plan Note (Signed)
Seen on past CT scan Discussed statins---he agrees to weekly crestor 20mg 

## 2019-11-14 NOTE — Progress Notes (Signed)
Subjective:    Patient ID: Dakota Branch, male    DOB: Jul 07, 1950, 69 y.o.   MRN: [id number]  HPI Here for Medicare wellness visit and follow up of chronic health conditions This visit occurred during the SARS-CoV-2 public health emergency.  Safety protocols were in place, including screening questions prior to the visit, additional usage of staff PPE, and extensive cleaning of exam room while observing appropriate contact time as indicated for disinfecting solutions.   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 amLODipine (NORVASC) 5 MG tablet TAKE 1 TABLET BY MOUTH EVERY DAY 90 tablet 3  . meclizine (ANTIVERT) 25 MG tablet Take 1 tablet (25 mg total) by mouth 3 (three) times daily as needed. For vertigo 30 tablet 1  . sildenafil (REVATIO) 20 MG tablet TAKE 3 TO 5 TABLETS BY MOUTH DAILY AS NEEDED 50 tablet 2  . triamcinolone cream (KENALOG) 0.1 % APPLY 1 APPLICATION TOPICALLY 2 (TWO) TIMES DAILY AS NEEDED. 45 g 1   No current facility-administered medications on file prior to visit.    Allergies    Allergen Reactions  . Aspirin     REACTION: toxic reaction as a child--but this is unclear  . Lisinopril-Hydrochlorothiazide     REACTION: itching    Past Medical History:  Diagnosis Date  . Colon polyps   . ED (erectile dysfunction)   . GERD (gastroesophageal reflux disease)   . Hyperlipidemia   . Hypertension   . OSA (obstructive sleep apnea)     Past Surgical History:  Procedure Laterality Date  . hepatitis ( prob A)  1978    Family History  Problem Relation Age of Onset  . Hydrocephalus Mother        shunt  . Arthritis Mother   . Diabetes Mother   . Hypertension Father   . Heart attack Maternal Grandmother   . Diabetes Maternal Aunt   . Breast cancer Sister   . Colon cancer Neg Hx   . Esophageal cancer Neg Hx   . Rectal cancer Neg Hx   . Stomach cancer Neg Hx     Social History   Socioeconomic History  . Marital status: Married    Spouse name: Not on file  . Number of children: 4  . Years of education: Not on file  . Highest education level: Not on file  Occupational History  . Occupation: owner    Comment: Event organiser  Tobacco Use  . Smoking status: Never Smoker  . Smokeless tobacco: Never Used  Substance and Sexual Activity  . Alcohol use: Yes    Comment: rare  . Drug use: No  . Sexual activity: Yes    Birth control/protection: Abstinence  Other Topics Concern  . Not on file  Social History Narrative   Pt is a Teacher, early years/pre.      Has living will    Wife is health care POA---alternate would be children   Would accept resuscitation   Would accept tube feeds   Social Determinants of Health   Financial Resource Strain:   . Difficulty of Paying Living Expenses: Not on file  Food Insecurity:   . Worried About Charity fundraiser in the Last Year: Not on file  . Ran Out of Food in the Last Year: Not on file  Transportation Needs:   . Lack of Transportation (Medical): Not on file  . Lack of Transportation (Non-Medical): Not on  file  Physical Activity:   . Days of Exercise per Week: Not on file  . Minutes of Exercise per Session: Not on file  Stress:   . Feeling of Stress : Not on file  Social Connections:   . Frequency of Communication with Friends and Family: Not on file  . Frequency of Social Gatherings with Friends and Family: Not on file  . Attends Religious Services: Not on file  . Active Member of Clubs or Organizations: Not on file  . Attends Archivist Meetings: Not on file  . Marital Status: Not on file  Intimate Partner Violence:   . Fear of Current or Ex-Partner: Not on file  . Emotionally Abused: Not on file  . Physically Abused: Not on file  . Sexually Abused: Not on file   Review of Systems Appetite is okay Weight is stable Sleeps well Wears seat belt Teeth are okay---keeps up with dentist. Wears teeth guard at night Occasional heartburn--knows what to avoid. No dysphagia Bowels are fine---no blood Voids well. Stream okay. Nocturia x 1 at most (early AM) No current suspicious skin lesions Some mild joint pains    Objective:   Physical Exam Constitutional:      Appearance: Normal appearance.  HENT:     Mouth/Throat:     Comments: No lesions Eyes:     Conjunctiva/sclera: Conjunctivae normal.     Pupils: Pupils are equal, round, and reactive to light.  Cardiovascular:     Rate and Rhythm: Normal rate and regular rhythm.     Pulses: Normal pulses.     Heart sounds: No murmur heard.  No gallop.   Pulmonary:     Effort: Pulmonary effort is normal.     Breath sounds: Normal breath sounds. No wheezing or rales.  Abdominal:     Palpations: Abdomen is soft.     Tenderness: There is no abdominal tenderness.  Musculoskeletal:     Cervical back: Neck supple.     Right lower leg: No edema.     Left lower leg: No edema.  Lymphadenopathy:     Cervical: No cervical adenopathy.  Skin:    General: Skin is warm.     Findings: No rash.  Neurological:     Mental Status: He is  alert and oriented to person, place, and time.     Comments: President--- "Biden, Trump,Bush---Barack" [phone] [id number] Recall 3/3  Psychiatric:        Mood and Affect: Mood normal.        Behavior: Behavior normal.            Assessment & Plan:

## 2019-11-14 NOTE — Assessment & Plan Note (Signed)
See social history 

## 2019-11-14 NOTE — Assessment & Plan Note (Signed)
BP Readings from Last 3 Encounters:  11/14/19 132/74  09/24/19 124/84  08/12/19 (!) 142/80   Doing fine on the amlodipine

## 2019-11-19 ENCOUNTER — Inpatient Hospital Stay: Payer: Medicare HMO | Attending: Hematology and Oncology

## 2019-11-19 ENCOUNTER — Other Ambulatory Visit: Payer: Self-pay

## 2019-11-19 DIAGNOSIS — D472 Monoclonal gammopathy: Secondary | ICD-10-CM | POA: Diagnosis not present

## 2019-11-19 DIAGNOSIS — Z79899 Other long term (current) drug therapy: Secondary | ICD-10-CM | POA: Insufficient documentation

## 2019-11-19 LAB — CMP (CANCER CENTER ONLY)
ALT: 28 U/L (ref 0–44)
AST: 21 U/L (ref 15–41)
Albumin: 4.1 g/dL (ref 3.5–5.0)
Alkaline Phosphatase: 76 U/L (ref 38–126)
Anion gap: 8 (ref 5–15)
BUN: 12 mg/dL (ref 8–23)
CO2: 28 mmol/L (ref 22–32)
Calcium: 9.1 mg/dL (ref 8.9–10.3)
Chloride: 104 mmol/L (ref 98–111)
Creatinine: 0.84 mg/dL (ref 0.61–1.24)
GFR, Estimated: >60 mL/min (ref >60)
Glucose, Bld: 106 mg/dL — ABNORMAL HIGH (ref 70–99)
Potassium: 4.1 mmol/L (ref 3.5–5.1)
Sodium: 140 mmol/L (ref 135–145)
Total Bilirubin: 2.5 mg/dL — ABNORMAL HIGH (ref 0.3–1.2)
Total Protein: 7.4 g/dL (ref 6.5–8.1)

## 2019-11-19 LAB — CBC WITH DIFFERENTIAL (CANCER CENTER ONLY)
Abs Immature Granulocytes: 0.02 10*3/uL (ref 0.00–0.07)
Basophils Absolute: 0 10*3/uL (ref 0.0–0.1)
Basophils Relative: 1 %
Eosinophils Absolute: 0.1 10*3/uL (ref 0.0–0.5)
Eosinophils Relative: 2 %
HCT: 42.5 % (ref 39.0–52.0)
Hemoglobin: 14.3 g/dL (ref 13.0–17.0)
Immature Granulocytes: 0 %
Lymphocytes Relative: 26 %
Lymphs Abs: 1.6 10*3/uL (ref 0.7–4.0)
MCH: 29.1 pg (ref 26.0–34.0)
MCHC: 33.6 g/dL (ref 30.0–36.0)
MCV: 86.6 fL (ref 80.0–100.0)
Monocytes Absolute: 0.6 10*3/uL (ref 0.1–1.0)
Monocytes Relative: 10 %
Neutro Abs: 3.7 10*3/uL (ref 1.7–7.7)
Neutrophils Relative %: 61 %
Platelet Count: 247 10*3/uL (ref 150–400)
RBC: 4.91 MIL/uL (ref 4.22–5.81)
RDW: 12.5 % (ref 11.5–15.5)
WBC Count: 6 10*3/uL (ref 4.0–10.5)
nRBC: 0 % (ref 0.0–0.2)

## 2019-11-20 LAB — MULTIPLE MYELOMA PANEL, SERUM
Albumin SerPl Elph-Mcnc: 3.7 g/dL (ref 2.9–4.4)
Albumin/Glob SerPl: 1.2 (ref 0.7–1.7)
Alpha 1: 0.2 g/dL (ref 0.0–0.4)
Alpha2 Glob SerPl Elph-Mcnc: 0.6 g/dL (ref 0.4–1.0)
B-Globulin SerPl Elph-Mcnc: 1.4 g/dL — ABNORMAL HIGH (ref 0.7–1.3)
Gamma Glob SerPl Elph-Mcnc: 1 g/dL (ref 0.4–1.8)
Globulin, Total: 3.3 g/dL (ref 2.2–3.9)
IgA: 538 mg/dL — ABNORMAL HIGH (ref 61–437)
IgG (Immunoglobin G), Serum: 1011 mg/dL (ref 603–1613)
IgM (Immunoglobulin M), Srm: 49 mg/dL (ref 20–172)
M Protein SerPl Elph-Mcnc: 0.4 g/dL — ABNORMAL HIGH
Total Protein ELP: 7 g/dL (ref 6.0–8.5)

## 2019-11-20 LAB — KAPPA/LAMBDA LIGHT CHAINS
Kappa free light chain: 24.5 mg/L — ABNORMAL HIGH (ref 3.3–19.4)
Kappa, lambda light chain ratio: 2.19 — ABNORMAL HIGH (ref 0.26–1.65)
Lambda free light chains: 11.2 mg/L (ref 5.7–26.3)

## 2019-11-20 LAB — BETA 2 MICROGLOBULIN, SERUM: Beta-2 Microglobulin: 1.5 mg/L (ref 0.6–2.4)

## 2019-11-25 NOTE — Progress Notes (Signed)
Patient Care Team: Venia Carbon, MD as PCP - General  DIAGNOSIS:    ICD-10-CM   1. MGUS (monoclonal gammopathy of unknown significance)  D47.2     CHIEF COMPLIANT: Follow-up of MGUS  INTERVAL HISTORY: Dakota Branch is a 69 y.o. with above-mentioned history of MGUS. Labs on 11/19/19 showed Hg 14.3, platelets 247, creatinine 0.84, m-protein 0.4, kappa lambda light chain ratio 2.19, beta-2 microglobulin 1.5. He presents to the clinic today for follow-up.  He is otherwise doing quite well without any problems or concerns.  ALLERGIES:  is allergic to aspirin and lisinopril-hydrochlorothiazide.  MEDICATIONS:  Current Outpatient Medications  Medication Sig Dispense Refill   amLODipine (NORVASC) 5 MG tablet TAKE 1 TABLET BY MOUTH EVERY DAY 90 tablet 3   meclizine (ANTIVERT) 25 MG tablet Take 1 tablet (25 mg total) by mouth 3 (three) times daily as needed. For vertigo 30 tablet 1   rosuvastatin (CRESTOR) 20 MG tablet Take 1 tablet (20 mg total) by mouth once a week. 13 tablet 3   sildenafil (REVATIO) 20 MG tablet TAKE 3 TO 5 TABLETS BY MOUTH DAILY AS NEEDED 50 tablet 2   triamcinolone cream (KENALOG) 0.1 % APPLY 1 APPLICATION TOPICALLY 2 (TWO) TIMES DAILY AS NEEDED. 45 g 1   No current facility-administered medications for this visit.    PHYSICAL EXAMINATION: ECOG PERFORMANCE STATUS: 1 - Symptomatic but completely ambulatory  Vitals:   11/26/19 1211  BP: (!) 153/77  Pulse: 78  Resp: 18  Temp: (!) 97.1 F (36.2 C)  SpO2: 99%    LABORATORY DATA:  I have reviewed the data as listed CMP Latest Ref Rng & Units 11/19/2019 11/07/2019 05/13/2019  Glucose 70 - 99 mg/dL 106(H) 104(H) -  BUN 8 - 23 mg/dL 12 14 -  Creatinine 0.61 - 1.24 mg/dL 0.84 0.89 -  Sodium 135 - 145 mmol/L 140 139 -  Potassium 3.5 - 5.1 mmol/L 4.1 4.5 -  Chloride 98 - 111 mmol/L 104 102 -  CO2 22 - 32 mmol/L 28 31 -  Calcium 8.9 - 10.3 mg/dL 9.1 9.2 -  Total Protein 6.5 - 8.1 g/dL 7.4 6.7 6.7  Total  Bilirubin 0.3 - 1.2 mg/dL 2.5(H) 2.0(H) -  Alkaline Phos 38 - 126 U/L 76 71 -  AST 15 - 41 U/L 21 19 -  ALT 0 - 44 U/L 28 24 -    Lab Results  Component Value Date   WBC 6.0 11/19/2019   HGB 14.3 11/19/2019   HCT 42.5 11/19/2019   MCV 86.6 11/19/2019   PLT 247 11/19/2019   NEUTROABS 3.7 11/19/2019    ASSESSMENT & PLAN:  MGUS (monoclonal gammopathy of unknown significance) IgA kappa monoclonal protein (work-up was performed for neuropathy evaluation) M protein was not measurable No evidence of multiple myeloma.  Creatinine 0.81, calcium 9.3, albumin 4.3, hemoglobin 14.3 Bone survey 05/30/2019: No osseous metastatic disease.  Lab review  11/20/2019: Beta-2 microglobulin 1.5, creatinine 0.84, albumin 4.1, total bilirubin 2.5, hemoglobin 14.3, M protein 0.4 g IgA kappa, kappa 24.5, lambda 11.2, ratio 2.19  I recommended continued monitoring of the serum protein electrophoresis Recheck labs in 1 year and follow-up after that.   No orders of the defined types were placed in this encounter.  The patient has a good understanding of the overall plan. he agrees with it. he will call with any problems that may develop before the next visit here.  Total time spent: 20 mins including face to face time and time  spent for planning, charting and coordination of care  Nicholas Lose, MD 11/26/2019  I, Cloyde Reams Dorshimer, am acting as scribe for Dr. Nicholas Lose.  I have reviewed the above documentation for accuracy and completeness, and I agree with the above.

## 2019-11-26 ENCOUNTER — Inpatient Hospital Stay (HOSPITAL_BASED_OUTPATIENT_CLINIC_OR_DEPARTMENT_OTHER): Payer: Medicare HMO | Admitting: Hematology and Oncology

## 2019-11-26 ENCOUNTER — Telehealth: Payer: Self-pay | Admitting: Hematology and Oncology

## 2019-11-26 ENCOUNTER — Other Ambulatory Visit: Payer: Self-pay

## 2019-11-26 DIAGNOSIS — D472 Monoclonal gammopathy: Secondary | ICD-10-CM | POA: Diagnosis not present

## 2019-11-26 NOTE — Assessment & Plan Note (Signed)
IgA kappa monoclonal protein (work-up was performed for neuropathy evaluation) M protein was not measurable No evidence of multiple myeloma.  Creatinine 0.81, calcium 9.3, albumin 4.3, hemoglobin 14.3 Bone survey 05/30/2019: No osseous metastatic disease.  Lab review  11/20/2019: Beta-2 microglobulin 1.5, creatinine 0.84, albumin 4.1, total bilirubin 2.5, hemoglobin 14.3, M protein 0.4 g IgA kappa, kappa 24.5, lambda 11.2, ratio 2.19  I recommended continued monitoring of the serum protein electrophoresis Recheck labs in 1 year and follow-up after that.

## 2019-11-26 NOTE — Telephone Encounter (Signed)
Scheduled appts per 11/24 los. Gave pt a print out of AVS.

## 2020-01-01 DIAGNOSIS — D3132 Benign neoplasm of left choroid: Secondary | ICD-10-CM | POA: Diagnosis not present

## 2020-01-07 DIAGNOSIS — Z20822 Contact with and (suspected) exposure to covid-19: Secondary | ICD-10-CM | POA: Diagnosis not present

## 2020-04-07 ENCOUNTER — Other Ambulatory Visit: Payer: Self-pay | Admitting: Internal Medicine

## 2020-04-13 ENCOUNTER — Ambulatory Visit (INDEPENDENT_AMBULATORY_CARE_PROVIDER_SITE_OTHER): Payer: Medicare HMO | Admitting: Internal Medicine

## 2020-04-13 ENCOUNTER — Other Ambulatory Visit: Payer: Self-pay

## 2020-04-13 ENCOUNTER — Telehealth: Payer: Self-pay

## 2020-04-13 ENCOUNTER — Encounter: Payer: Self-pay | Admitting: Internal Medicine

## 2020-04-13 DIAGNOSIS — I10 Essential (primary) hypertension: Secondary | ICD-10-CM

## 2020-04-13 NOTE — Assessment & Plan Note (Signed)
BP Readings from Last 3 Encounters:  04/13/20 126/66  11/26/19 (!) 153/77  11/14/19 132/74   I got repeat 116/62 on right Clearly the dentist office measurements did not seem to represent true elevated BP Continues on the amlodipine He will buy an arm cuff to monitor at home intermittently

## 2020-04-13 NOTE — Telephone Encounter (Signed)
[facility] Night - Client TELEPHONE ADVICE RECORD [facility] Patient Name: Dakota Branch Gender: Male DOB: 11-24-1950 Age: 70 Y 93 M 1 D Return Phone Number: [phone] (Primary), [phone] (Secondary) Address: City/ State/ Zip: [place] [address] Client [place] Night - Client Client Site [place] Physician Viviana Simpler- MD Contact Type Call Who Is Calling Patient / Member / Family / Caregiver Call Type Triage / Clinical Relationship To Patient Self Return Phone Number [phone] (Primary) Chief Complaint Blood Pressure High Reason for Call Symptomatic / Request for [place] states he was at his dentist this morning and his blood pressure was 186, on the higher end. Caller states the dentist just called him and told him to see a doctor immediately. Translation No Nurse Assessment Nurse: Donna Christen, RN, Legrand Como Date/Time Eilene Ghazi Time): 04/12/2020 9:40:32 PM Confirm and document reason for call. If symptomatic, describe symptoms. ---Caller states that he checked his BP at [facility] 156/79. No symptoms at this time. Takes HTN medication. Does the patient have any new or worsening symptoms? ---Yes Will a triage be completed? ---Yes Related visit to physician within the last 2 weeks? ---No Does the PT have any chronic conditions? (i.e. diabetes, asthma, this includes High risk factors for pregnancy, etc.) ---Yes List chronic conditions. ---HTN, Is this a behavioral health or substance abuse call? ---No Guidelines Guideline Title Affirmed Question Affirmed Notes Nurse Date/Time (Eastern Time) Blood Pressure - High [4] Systolic BP >= 854 OR Diastolic >= 80 AND [6] taking BP medications Darleen Crocker 04/12/2020 9:41:45 PM Disp. Time Eilene Ghazi Time) Disposition Final User 04/12/2020 9:20:42 PM Attempt made - message left Darleen Crocker 04/12/2020 9:44:04 PM See PCP within 2 Weeks Yes Donna Christen, RN, Legrand Como PLEASE NOTE: All timestamps contained within this report are represented as [place] Standard Time. CONFIDENTIALTY NOTICE: This fax transmission is intended only for the addressee. It contains information that is legally privileged, confidential or otherwise protected from use or disclosure. If you are not the intended recipient, you are strictly prohibited from reviewing, disclosing, copying using or disseminating any of this information or taking any action in reliance on or regarding this information. If you have received this fax in error, please notify us immediately by telephone so that we can arrange for its return to [place]. Phone: [phone], Toll-Free: [phone], Fax: [phone] Page: 2 of 2 Call Id: [id number] [place] Disagree/Comply Comply Caller Understands Yes PreDisposition Call Doctor Care Advice Given Per Guideline SEE PCP WITHIN 2 WEEKS: * You become worse * Your blood pressure is over 160/100 * Chest pain or difficulty breathing occurs * Difficulty walking, difficulty talking, or severe headache occurs * Weakness or numbness of the face, arm or leg on one side of the body occurs CALL BACK IF: CARE ADVICE given per High Blood Pressure (Adult) guideline. * REDUCE STRESS: Find activities that help reduce your stress. Examples might include meditation, yoga, or even a restful walk in a park. * DECREASE SODIUM INTAKE: Aim to eat less than 2.4 g (100 mmol) of sodium each day. Unfortunately 75% of the salt in the average person's diet is in pre-processed foods. Referrals REFERRED TO PCP OFFICE

## 2020-04-13 NOTE — Telephone Encounter (Signed)
I spoke with pt; no H/A,dizziness,CP or SOB. When went to dentist was not nervous or in pain. Pt does not have any symptoms at this time. Pt has not missed taking his amlodipine 5 mg one daily. Pt does not have a way to ck BP at home. Pt wanted to be seen today. Pt will see Dr Silvio Pate 04/13/20 at 8:45.

## 2020-04-13 NOTE — Progress Notes (Signed)
Subjective:    Patient ID: Dakota Branch, male    DOB: [birth year], 70 y.o.   MRN: [id number]  HPI Here due to elevated BP at dentist's office yesterday morning This visit occurred during the SARS-CoV-2 public health emergency.  Safety protocols were in place, including screening questions prior to the visit, additional usage of staff PPE, and extensive cleaning of exam room while observing appropriate contact time as indicated for disinfecting solutions.   Dentist's office yesterday morning Had felt fine They did BP with wrist cuff twice--then arm cuff Systolics in 952'W---UXLK 176 with arm cuff They still did the planned cleaning and exam  No headache No chest pain or SOB Has felt his normal  Current Outpatient Medications on File Prior to Visit  Medication Sig Dispense Refill  . amLODipine (NORVASC) 5 MG tablet TAKE 1 TABLET BY MOUTH EVERY DAY 90 tablet 3  . meclizine (ANTIVERT) 25 MG tablet Take 1 tablet (25 mg total) by mouth 3 (three) times daily as needed. For vertigo 30 tablet 1  . sildenafil (REVATIO) 20 MG tablet TAKE 3 TO 5 TABLETS BY MOUTH DAILY AS NEEDED 50 tablet 2  . triamcinolone cream (KENALOG) 0.1 % APPLY 1 APPLICATION TOPICALLY 2 (TWO) TIMES DAILY AS NEEDED. 45 g 1  . rosuvastatin (CRESTOR) 20 MG tablet Take 1 tablet (20 mg total) by mouth once a week. (Patient not taking: Reported on 04/13/2020) 13 tablet 3   No current facility-administered medications on file prior to visit.    Allergies  Allergen Reactions  . Aspirin     REACTION: toxic reaction as a child--but this is unclear  . Lisinopril-Hydrochlorothiazide     REACTION: itching    Past Medical History:  Diagnosis Date  . Colon polyps   . ED (erectile dysfunction)   . GERD (gastroesophageal reflux disease)   . Hyperlipidemia   . Hypertension   . OSA (obstructive sleep apnea)     Past Surgical History:  Procedure Laterality Date  . hepatitis ( prob A)  1978    Family History  Problem  Relation Age of Onset  . Hydrocephalus Mother        shunt  . Arthritis Mother   . Diabetes Mother   . Hypertension Father   . Heart attack Maternal Grandmother   . Diabetes Maternal Aunt   . Breast cancer Sister   . Colon cancer Neg Hx   . Esophageal cancer Neg Hx   . Rectal cancer Neg Hx   . Stomach cancer Neg Hx     Social History   Socioeconomic History  . Marital status: Married    Spouse name: Not on file  . Number of children: 4  . Years of education: Not on file  . Highest education level: Not on file  Occupational History  . Occupation: owner    Comment: Event organiser  Tobacco Use  . Smoking status: Never Smoker  . Smokeless tobacco: Never Used  Substance and Sexual Activity  . Alcohol use: Yes    Comment: rare  . Drug use: No  . Sexual activity: Yes    Birth control/protection: Abstinence  Other Topics Concern  . Not on file  Social History Narrative   Pt is a Teacher, early years/pre.      Has living will    Wife is health care POA---alternate would be children   Would accept resuscitation   Would accept tube feeds   Social Determinants of Health   Financial Resource Strain: Not on  file  Food Insecurity: Not on file  Transportation Needs: Not on file  Physical Activity: Not on file  Stress: Not on file  Social Connections: Not on file  Intimate Partner Violence: Not on file   Review of Systems Sleeping fine Appetite is okay    Objective:   Physical Exam Constitutional:      Appearance: Normal appearance.  Cardiovascular:     Rate and Rhythm: Normal rate and regular rhythm.     Heart sounds: No murmur heard. No gallop.   Pulmonary:     Effort: Pulmonary effort is normal.     Breath sounds: Normal breath sounds. No wheezing or rales.  Musculoskeletal:     Cervical back: Neck supple.  Lymphadenopathy:     Cervical: No cervical adenopathy.  Neurological:     Mental Status: He is alert.            Assessment & Plan:

## 2020-04-13 NOTE — Telephone Encounter (Signed)
Elevated BP at the dentist is extremely common---even with feeling nervous. I will check him here though as requested

## 2020-05-12 DIAGNOSIS — B351 Tinea unguium: Secondary | ICD-10-CM | POA: Diagnosis not present

## 2020-05-21 DIAGNOSIS — B351 Tinea unguium: Secondary | ICD-10-CM | POA: Diagnosis not present

## 2020-06-15 ENCOUNTER — Ambulatory Visit: Payer: Medicare HMO | Admitting: Family

## 2020-06-15 ENCOUNTER — Ambulatory Visit (INDEPENDENT_AMBULATORY_CARE_PROVIDER_SITE_OTHER): Payer: Medicare HMO | Admitting: Family

## 2020-06-15 ENCOUNTER — Encounter: Payer: Self-pay | Admitting: Family

## 2020-06-15 ENCOUNTER — Other Ambulatory Visit: Payer: Self-pay

## 2020-06-15 VITALS — BP 142/64 | HR 77 | Temp 98.1°F | Ht 69.0 in | Wt 183.2 lb

## 2020-06-15 DIAGNOSIS — M7989 Other specified soft tissue disorders: Secondary | ICD-10-CM | POA: Diagnosis not present

## 2020-06-15 NOTE — Progress Notes (Signed)
Dakota Branch is a 70 y.o. male with the following history as recorded in EpicCare:  Patient Active Problem List   Diagnosis Date Noted   Vertigo 08/12/2019   Neck pain 06/04/2019   Aortic atherosclerosis ([place]) 06/04/2019   MGUS (monoclonal gammopathy of unknown significance) 05/20/2019   Advance directive discussed with patient 11/11/2018   Trigger finger, right ring finger 05/24/2017   Arthritis of carpometacarpal [facility]) joint of right thumb 05/24/2017   Impingement syndrome of right shoulder 10/24/2016   Complete tear of right rotator cuff 10/24/2016   ED (erectile dysfunction)    Hyperlipidemia    Routine general medical examination at a health care facility 02/22/2011   Obstructive sleep apnea 09/06/2007   Essential hypertension, benign 09/12/2006   GERD 09/12/2006    Current Outpatient Medications  Medication Sig Dispense Refill   amLODipine (NORVASC) 5 MG tablet TAKE 1 TABLET BY MOUTH EVERY DAY 90 tablet 3   sildenafil (REVATIO) 20 MG tablet TAKE 3 TO 5 TABLETS BY MOUTH DAILY AS NEEDED 50 tablet 2   triamcinolone cream (KENALOG) 0.1 % APPLY 1 APPLICATION TOPICALLY 2 (TWO) TIMES DAILY AS NEEDED. 45 g 1   No current facility-administered medications for this visit.    Allergies: Aspirin and Lisinopril-hydrochlorothiazide  Past Medical History:  Diagnosis Date   Colon polyps    ED (erectile dysfunction)    GERD (gastroesophageal reflux disease)    Hyperlipidemia    Hypertension    OSA (obstructive sleep apnea)     Past Surgical History:  Procedure Laterality Date   hepatitis ( prob A)  1978    Family History  Problem Relation Age of Onset   Hydrocephalus Mother        shunt   Arthritis Mother    Diabetes Mother    Hypertension Father    Heart attack Maternal Grandmother    Diabetes Maternal Aunt    Breast cancer Sister    Colon cancer Neg Hx    Esophageal cancer Neg Hx    Rectal cancer Neg Hx    Stomach cancer Neg Hx     Social History   Tobacco Use    Smoking status: Never   Smokeless tobacco: Never  Substance Use Topics   Alcohol use: Yes    Comment: rare    Subjective:   Noticed "lump" on back of his left thigh today; has been feeling area of discomfort while driving/ sitting for extended period of time but no lump noticed until today; no pain or swelling in lower extremity;   Under care of Dr. Lindi Adie for MGUS but last exam in November 2021 was unremarkable;     Objective:  Vitals:   06/15/20 1545  BP: (!) 142/64  Pulse: 77  Temp: 98.1 F (36.7 C)  SpO2: 98%  Weight: 183 lb 3.2 oz (83.1 kg)  Height: 5\' 9"  (1.753 m)    General: Well developed, well nourished, in no acute distress  Skin : Warm and dry. Soft movable lesion noted on back of left thigh Head: Normocephalic and atraumatic  Lungs: Respirations unlabored;  Neurologic: Alert and oriented; speech intact; face symmetrical; moves all extremities well; CNII-XII intact without focal deficit  Assessment:  1. Soft tissue mass     Plan:  ? Lipoma; will update ultrasound and plan to refer to surgeon once ultrasound results are available; Continue with his PCP as scheduled;  This visit occurred during the SARS-CoV-2 public health emergency.  Safety protocols were in place, including screening questions prior  to the visit, additional usage of staff PPE, and extensive cleaning of exam room while observing appropriate contact time as indicated for disinfecting solutions.    No follow-ups on file.  Orders Placed This Encounter  Procedures   [place] LT LOWER EXTREM LTD SOFT TISSUE NON VASCULAR    Standing Status:   Future    Standing Expiration Date:   06/15/2021    Order Specific Question:   Reason for Exam (SYMPTOM  OR DIAGNOSIS REQUIRED)    Answer:   mass    Comments:   left posterior thigh/ ? lipoma    Order Specific Question:   Preferred imaging location?    Answer:   [facility]    Requested Prescriptions    No prescriptions requested or ordered in  this encounter

## 2020-06-15 NOTE — Patient Instructions (Signed)
Lipoma  A lipoma is a noncancerous (benign) tumor that is made up of fat cells. This is a very common type of soft-tissue growth. Lipomas are usually found under the skin (subcutaneous). They may occur in any tissue of the body that contains fat. Common areas forlipomas to appear include the back, arms, shoulders, buttocks, and thighs. Lipomas grow slowly, and they are usually painless. Most lipomas do not causeproblems and do not require treatment. What are the causes? The cause of this condition is not known. What increases the risk? You are more likely to develop this condition if: You are 40-60 years old. You have a family history of lipomas. What are the signs or symptoms? A lipoma usually appears as a small, round bump under the skin. In most cases, the lump will: Feel soft or rubbery. Not cause pain or other symptoms. However, if a lipoma is located in an area where it pushes on nerves, it canbecome painful or cause other symptoms. How is this diagnosed? A lipoma can usually be diagnosed with a physical exam. You may also have tests to confirm the diagnosis and to rule out other conditions. Tests may include: Imaging tests, such as a CT scan or an MRI. Removal of a tissue sample to be looked at under a microscope (biopsy). How is this treated? Treatment for this condition depends on the size of the lipoma and whether it is causing any symptoms. For small lipomas that are not causing problems, no treatment is needed. If a lipoma is bigger or it causes problems, surgery may be done to remove the lipoma. Lipomas can also be removed to improve appearance. Most often, the procedure is done after applying a medicine that numbs the area (local anesthetic). Liposuction may be done to reduce the size of the lipoma before it is removed through surgery, or it may be done to remove the lipoma. Lipomas are removed with this method in order to limit incision size and scarring. A liposuction tube is  inserted through a small incision into the lipoma, and the contents of the lipoma are removed through the tube with suction. Follow these instructions at home: Watch your lipoma for any changes. Keep all follow-up visits as told by your health care provider. This is important. Contact a health care provider if: Your lipoma becomes larger or hard. Your lipoma becomes painful, red, or increasingly swollen. These could be signs of infection or a more serious condition. Get help right away if: You develop tingling or numbness in an area near the lipoma. This could indicate that the lipoma is causing nerve damage. Summary A lipoma is a noncancerous tumor that is made up of fat cells. Most lipomas do not cause problems and do not require treatment. If a lipoma is bigger or it causes problems, surgery may be done to remove the lipoma. Contact a health care provider if your lipoma becomes larger or hard, or if it becomes painful, red, or increasingly swollen. Pain, redness, and swelling could be signs of infection or a more serious condition. This information is not intended to replace advice given to you by your health care provider. Make sure you discuss any questions you have with your healthcare provider. Document Revised: 08/05/2018 Document Reviewed: 08/05/2018 Elsevier Patient Education  2022 Elsevier Inc.  

## 2020-07-01 ENCOUNTER — Ambulatory Visit
Admission: RE | Admit: 2020-07-01 | Discharge: 2020-07-01 | Disposition: A | Discharge status: Home / Self Care | Payer: Medicare HMO | Source: Ambulatory Visit | Attending: Family | Admitting: Family

## 2020-07-01 DIAGNOSIS — R2242 Localized swelling, mass and lump, left lower limb: Secondary | ICD-10-CM | POA: Diagnosis not present

## 2020-07-01 DIAGNOSIS — M7989 Other specified soft tissue disorders: Secondary | ICD-10-CM

## 2020-07-06 ENCOUNTER — Other Ambulatory Visit: Payer: Self-pay | Admitting: Family

## 2020-07-06 DIAGNOSIS — R2242 Localized swelling, mass and lump, left lower limb: Secondary | ICD-10-CM

## 2020-07-12 ENCOUNTER — Other Ambulatory Visit: Payer: Self-pay | Admitting: Internal Medicine

## 2020-07-12 MED ORDER — TRIAMCINOLONE ACETONIDE 0.1 % EX CREA: 1.0000 "application " | TOPICAL_CREAM | Freq: Two times a day (BID) | CUTANEOUS | 1 refills | Status: DC | PRN

## 2020-07-12 NOTE — Addendum Note (Signed)
Addended by: Pilar Grammes on: 07/12/2020 11:27 AM   Modules accepted: Orders

## 2020-08-10 ENCOUNTER — Telehealth: Payer: Self-pay

## 2020-08-10 ENCOUNTER — Ambulatory Visit (INDEPENDENT_AMBULATORY_CARE_PROVIDER_SITE_OTHER): Payer: Medicare HMO | Admitting: Family Medicine

## 2020-08-10 ENCOUNTER — Other Ambulatory Visit: Payer: Self-pay

## 2020-08-10 VITALS — BP 142/70 | HR 63 | Temp 98.3°F | Ht 69.5 in | Wt 185.5 lb

## 2020-08-10 DIAGNOSIS — M7989 Other specified soft tissue disorders: Secondary | ICD-10-CM | POA: Diagnosis not present

## 2020-08-10 DIAGNOSIS — W57XXXA Bitten or stung by nonvenomous insect and other nonvenomous arthropods, initial encounter: Secondary | ICD-10-CM | POA: Diagnosis not present

## 2020-08-10 DIAGNOSIS — S30861A Insect bite (nonvenomous) of abdominal wall, initial encounter: Secondary | ICD-10-CM | POA: Diagnosis not present

## 2020-08-10 NOTE — Telephone Encounter (Signed)
Per appt notes pt already has appt with 08/10/20 at 10:40 with Dr Einar Pheasant.

## 2020-08-10 NOTE — Patient Instructions (Signed)
Leg swelling - watch and wait - if worsening swelling, redness, pain - call  - if no change may take up to 2-4 weeks resolve

## 2020-08-10 NOTE — Progress Notes (Signed)
Subjective:     Dakota Branch is a 70 y.o. male presenting for Leg Swelling (Inner L thigh area x 1 day. No pain. Pt had soft tissue mass in L thigh back in June. )     HPI   #Inner left thigh - noticed it when showering yesterday - noticed a size difference between the right and left thigh - no pain - no redness or skin changes - only noticed one day ago - hx of edema on the left thigh but lower and more posterior - [place] with edema  Did have a tick bite on the right side 1 month ago - still itchy on the location - right lower back location   Review of Systems   Social History   Tobacco Use  Smoking Status Never  Smokeless Tobacco Never        Objective:    BP Readings from Last 3 Encounters:  08/10/20 (!) 142/70  06/15/20 (!) 142/64  04/13/20 126/66   Wt Readings from Last 3 Encounters:  08/10/20 185 lb 8 oz (84.1 kg)  06/15/20 183 lb 3.2 oz (83.1 kg)  04/13/20 182 lb (82.6 kg)    BP (!) 142/70   Pulse 63   Temp 98.3 F (36.8 C) (Temporal)   Ht 5' 9.5" (1.765 m)   Wt 185 lb 8 oz (84.1 kg)   SpO2 99%   BMI 27.00 kg/m    Physical Exam Constitutional:      Appearance: Normal appearance. He is not ill-appearing or diaphoretic.  HENT:     Right Ear: External ear normal.     Left Ear: External ear normal.  Eyes:     General: No scleral icterus.    Extraocular Movements: Extraocular movements intact.     Conjunctiva/sclera: Conjunctivae normal.  Cardiovascular:     Rate and Rhythm: Normal rate and regular rhythm.  Pulmonary:     Effort: Pulmonary effort is normal. No respiratory distress.     Breath sounds: Normal breath sounds. No wheezing.  Musculoskeletal:     Cervical back: Neck supple.     Comments: Slight generalized swelling of the left inner thigh. No fluctuance, erythema, tenderness.   Skin:    General: Skin is warm and dry.     Comments: Right lower abdomen/hip with small erythematous bite. No other rashes or lesions.   Neurological:     Mental Status: He is alert. Mental status is at baseline.  Psychiatric:        Mood and Affect: Mood normal.          Assessment & Plan:   Problem List Items Addressed This Visit       Other   Localized swelling of lower extremity - Primary    Similar episode in June with US showing local edema. Based on exam does have some swelling but no well circumscribed and lacks any erythema or concern for infection. Advised watch and wait. Discussed warning signs for DVT/infection and to call back if present.        Other Visit Diagnoses     Tick bite of abdomen, initial encounter          Tick bite - 1 month out without any other rashes or infectious hx. Watch and wait.   I spent >15 minutes with pt , obtaining history, examining, reviewing chart, documenting encounter and discussing the above plan of care.   Return if symptoms worsen or fail to improve.  Lesleigh Noe, MD  This visit occurred during the SARS-CoV-2 public health emergency.  Safety protocols were in place, including screening questions prior to the visit, additional usage of staff PPE, and extensive cleaning of exam room while observing appropriate contact time as indicated for disinfecting solutions.

## 2020-08-10 NOTE — Assessment & Plan Note (Signed)
Similar episode in June with US showing local edema. Based on exam does have some swelling but no well circumscribed and lacks any erythema or concern for infection. Advised watch and wait. Discussed warning signs for DVT/infection and to call back if present.

## 2020-08-10 NOTE — Telephone Encounter (Signed)
[place] Night - Client Nonclinical Telephone Record [facility] Client [place] Night - Client Client Site [place] Physician Viviana Simpler- MD Contact Type Call Who Is Calling Patient / Member / Family / Caregiver Caller Name [name] Phone Number [phone] Patient Name Dakota Branch Patient DOB 04-19-1950 Call Type Message Only Information Provided Reason for Call Request to Schedule Office Appointment Initial Comment Caller states he wants to make an appointment. Patient request to speak to RN No Additional Comment Provided office hours. Declined triage. Disp. Time Disposition Final User 08/09/2020 5:12:13 PM General Information Provided Yes Fifer, Levada Dy Call Closed By: Hilarie Fredrickson Transaction Date/Time: 08/09/2020 5:09:17 PM (ET)

## 2020-08-23 ENCOUNTER — Other Ambulatory Visit: Payer: Self-pay | Admitting: Internal Medicine

## 2020-09-01 ENCOUNTER — Ambulatory Visit (INDEPENDENT_AMBULATORY_CARE_PROVIDER_SITE_OTHER): Payer: Medicare HMO | Admitting: Internal Medicine

## 2020-09-01 ENCOUNTER — Encounter: Payer: Self-pay | Admitting: Internal Medicine

## 2020-09-01 ENCOUNTER — Other Ambulatory Visit: Payer: Self-pay

## 2020-09-01 DIAGNOSIS — M7989 Other specified soft tissue disorders: Secondary | ICD-10-CM

## 2020-09-01 DIAGNOSIS — I10 Essential (primary) hypertension: Secondary | ICD-10-CM

## 2020-09-01 MED ORDER — CHLORTHALIDONE 25 MG PO TABS
25.0000 mg | ORAL_TABLET | Freq: Every day | ORAL | 3 refills | Status: DC
Start: 2020-09-01 — End: 2021-07-19

## 2020-09-01 NOTE — Progress Notes (Signed)
Subjective:    Patient ID: Dakota Branch, male    DOB: March 12, 1950, 70 y.o.   MRN: [id number]  HPI Here due to inflammation along his groin This visit occurred during the SARS-CoV-2 public health emergency.  Safety protocols were in place, including screening questions prior to the visit, additional usage of staff PPE, and extensive cleaning of exam room while observing appropriate contact time as indicated for disinfecting solutions.   Still has left inner thigh swelling First noticed posterior thigh swelling in June--had negative ultrasound Now notices swelling medial upper thigh Seems about the same as visit earlier this month No pain No foot swelling  No treatment  Current Outpatient Medications on File Prior to Visit  Medication Sig Dispense Refill   amLODipine (NORVASC) 5 MG tablet TAKE 1 TABLET BY MOUTH EVERY DAY 90 tablet 3   sildenafil (REVATIO) 20 MG tablet TAKE 3 TO 5 TABLETS BY MOUTH DAILY AS NEEDED 50 tablet 11   triamcinolone cream (KENALOG) 0.1 % Apply 1 application topically 2 (two) times daily as needed. 45 g 1   No current facility-administered medications on file prior to visit.    Allergies  Allergen Reactions   Aspirin     REACTION: toxic reaction as a child--but this is unclear   Lisinopril-Hydrochlorothiazide     REACTION: itching    Past Medical History:  Diagnosis Date   Colon polyps    ED (erectile dysfunction)    GERD (gastroesophageal reflux disease)    Hyperlipidemia    Hypertension    OSA (obstructive sleep apnea)     Past Surgical History:  Procedure Laterality Date   hepatitis ( prob A)  1978    Family History  Problem Relation Age of Onset   Hydrocephalus Mother        shunt   Arthritis Mother    Diabetes Mother    Hypertension Father    Heart attack Maternal Grandmother    Diabetes Maternal Aunt    Breast cancer Sister    Colon cancer Neg Hx    Esophageal cancer Neg Hx    Rectal cancer Neg Hx    Stomach cancer Neg Hx      Social History   Socioeconomic History   Marital status: Married    Spouse name: Not on file   Number of children: 4   Years of education: Not on file   Highest education level: Not on file  Occupational History   Occupation: owner    Comment: [employer]  Tobacco Use   Smoking status: Never   Smokeless tobacco: Never  Substance and Sexual Activity   Alcohol use: Yes    Comment: rare   Drug use: No   Sexual activity: Yes    Birth control/protection: Abstinence  Other Topics Concern   Not on file  Social History Narrative   Pt is a Teacher, early years/pre.      Has living will    Wife is health care POA---alternate would be children   Would accept resuscitation   Would accept tube feeds   Social Determinants of Health   Financial Resource Strain: Not on file  Food Insecurity: Not on file  Transportation Needs: Not on file  Physical Activity: Not on file  Stress: Not on file  Social Connections: Not on file  Intimate Partner Violence: Not on file   Review of Systems No fever No clear lymph nodes     Objective:   Physical Exam Constitutional:  Appearance: Normal appearance.  Genitourinary:    Testes: Normal.     Comments: Scrotum normal No inguinal or femoral hernias Musculoskeletal:     Comments: Slightly prominent skin on upper center left thigh----no mass  Lymphadenopathy:     Lower Body: No right inguinal adenopathy. No left inguinal adenopathy.  Neurological:     Mental Status: He is alert.           Assessment & Plan:

## 2020-09-01 NOTE — Assessment & Plan Note (Signed)
BP Readings from Last 3 Encounters:  09/01/20 (!) 156/74  08/10/20 (!) 142/70  06/15/20 (!) 142/64   He has mild vague symptoms Will add chlorthalidone 25 See back in 1 month

## 2020-09-01 NOTE — Assessment & Plan Note (Signed)
Seems to have prominent skin fold at top of left thigh Discussed that this is slightly asymmetric with right side--but a normal variant No action needed

## 2020-09-01 NOTE — Patient Instructions (Signed)
Please try an over the counter potassium daily with this new blood pressure medication.

## 2020-10-08 ENCOUNTER — Ambulatory Visit (INDEPENDENT_AMBULATORY_CARE_PROVIDER_SITE_OTHER): Payer: Medicare HMO | Admitting: Internal Medicine

## 2020-10-08 ENCOUNTER — Encounter: Payer: Self-pay | Admitting: Internal Medicine

## 2020-10-08 ENCOUNTER — Other Ambulatory Visit: Payer: Self-pay

## 2020-10-08 VITALS — BP 132/82 | HR 62 | Temp 97.7°F | Ht 70.0 in | Wt 181.0 lb

## 2020-10-08 DIAGNOSIS — I10 Essential (primary) hypertension: Secondary | ICD-10-CM

## 2020-10-08 DIAGNOSIS — Z125 Encounter for screening for malignant neoplasm of prostate: Secondary | ICD-10-CM | POA: Diagnosis not present

## 2020-10-08 DIAGNOSIS — Z23 Encounter for immunization: Secondary | ICD-10-CM

## 2020-10-08 LAB — CBC
HCT: 43 % (ref 39.0–52.0)
Hemoglobin: 14.5 g/dL (ref 13.0–17.0)
MCHC: 33.7 g/dL (ref 30.0–36.0)
MCV: 87.2 fl (ref 78.0–100.0)
Platelets: 268 10*3/uL (ref 150.0–400.0)
RBC: 4.94 Mil/uL (ref 4.22–5.81)
RDW: 12.8 % (ref 11.5–15.5)
WBC: 5.8 10*3/uL (ref 4.0–10.5)

## 2020-10-08 LAB — HEPATIC FUNCTION PANEL
ALT: 25 U/L (ref 0–53)
AST: 23 U/L (ref 0–37)
Albumin: 4.5 g/dL (ref 3.5–5.2)
Alkaline Phosphatase: 65 U/L (ref 39–117)
Bilirubin, Direct: 0.2 mg/dL (ref 0.0–0.3)
Total Bilirubin: 1.3 mg/dL — ABNORMAL HIGH (ref 0.2–1.2)
Total Protein: 7.3 g/dL (ref 6.0–8.3)

## 2020-10-08 LAB — RENAL FUNCTION PANEL
Albumin: 4.5 g/dL (ref 3.5–5.2)
BUN: 14 mg/dL (ref 6–23)
CO2: 37 mEq/L — ABNORMAL HIGH (ref 19–32)
Calcium: 9.7 mg/dL (ref 8.4–10.5)
Chloride: 95 mEq/L — ABNORMAL LOW (ref 96–112)
Creatinine, Ser: 0.9 mg/dL (ref 0.40–1.50)
GFR: 87.98 mL/min (ref >60.00)
Glucose, Bld: 114 mg/dL — ABNORMAL HIGH (ref 70–99)
Phosphorus: 3.2 mg/dL (ref 2.3–4.6)
Potassium: 3.4 mEq/L — ABNORMAL LOW (ref 3.5–5.1)
Sodium: 139 mEq/L (ref 135–145)

## 2020-10-08 LAB — LIPID PANEL
Cholesterol: 196 mg/dL (ref 0–200)
HDL: 40.8 mg/dL (ref >39.00)
LDL Cholesterol: 137 mg/dL — ABNORMAL HIGH (ref 0–99)
NonHDL: 155.37
Total CHOL/HDL Ratio: 5
Triglycerides: 94 mg/dL (ref 0.0–149.0)
VLDL: 18.8 mg/dL (ref 0.0–40.0)

## 2020-10-08 LAB — PSA, MEDICARE: PSA: 0.59 ng/ml (ref 0.10–4.00)

## 2020-10-08 LAB — T4, FREE: Free T4: 0.82 ng/dL (ref 0.60–1.60)

## 2020-10-08 NOTE — Assessment & Plan Note (Signed)
BP Readings from Last 3 Encounters:  10/08/20 132/82  09/01/20 (!) 156/74  08/10/20 (!) 142/70   Better now with chlorthalidone added to amlodipine Will check labs

## 2020-10-08 NOTE — Progress Notes (Signed)
Subjective:    Patient ID: Dakota Branch, male    DOB: 09-Jun-1950, 70 y.o.   MRN: [id number]  HPI Here for follow up of his elevated BP This visit occurred during the SARS-CoV-2 public health emergency.  Safety protocols were in place, including screening questions prior to the visit, additional usage of staff PPE, and extensive cleaning of exam room while observing appropriate contact time as indicated for disinfecting solutions.   No problems with new medication No dizziness, weakness or fatigue Hasn't checked BP at home No edema No headaches Is taking OTC potassium as well  Current Outpatient Medications on File Prior to Visit  Medication Sig Dispense Refill   amLODipine (NORVASC) 5 MG tablet TAKE 1 TABLET BY MOUTH EVERY DAY 90 tablet 3   chlorthalidone (HYGROTON) 25 MG tablet Take 1 tablet (25 mg total) by mouth daily. 90 tablet 3   sildenafil (REVATIO) 20 MG tablet TAKE 3 TO 5 TABLETS BY MOUTH DAILY AS NEEDED 50 tablet 11   triamcinolone cream (KENALOG) 0.1 % Apply 1 application topically 2 (two) times daily as needed. 45 g 1   No current facility-administered medications on file prior to visit.    Allergies  Allergen Reactions   Aspirin     REACTION: toxic reaction as a child--but this is unclear   Lisinopril-Hydrochlorothiazide     REACTION: itching    Past Medical History:  Diagnosis Date   Colon polyps    ED (erectile dysfunction)    GERD (gastroesophageal reflux disease)    Hyperlipidemia    Hypertension    OSA (obstructive sleep apnea)     Past Surgical History:  Procedure Laterality Date   hepatitis ( prob A)  1978    Family History  Problem Relation Age of Onset   Hydrocephalus Mother        shunt   Arthritis Mother    Diabetes Mother    Hypertension Father    Heart attack Maternal Grandmother    Diabetes Maternal Aunt    Breast cancer Sister    Colon cancer Neg Hx    Esophageal cancer Neg Hx    Rectal cancer Neg Hx    Stomach cancer Neg  Hx     Social History   Socioeconomic History   Marital status: Married    Spouse name: Not on file   Number of children: 4   Years of education: Not on file   Highest education level: Not on file  Occupational History   Occupation: owner    Comment: [employer]  Tobacco Use   Smoking status: Never   Smokeless tobacco: Never  Substance and Sexual Activity   Alcohol use: Yes    Comment: rare   Drug use: No   Sexual activity: Yes    Birth control/protection: Abstinence  Other Topics Concern   Not on file  Social History Narrative   Pt is a Teacher, early years/pre.      Has living will    Wife is health care POA---alternate would be children   Would accept resuscitation   Would accept tube feeds   Social Determinants of Health   Financial Resource Strain: Not on file  Food Insecurity: Not on file  Transportation Needs: Not on file  Physical Activity: Not on file  Stress: Not on file  Social Connections: Not on file  Intimate Partner Violence: Not on file   Review of Systems Sleeps okay Appetite is good     Objective:   Physical Exam  Constitutional:      Appearance: Normal appearance.  Cardiovascular:     Rate and Rhythm: Normal rate and regular rhythm.     Pulses: Normal pulses.     Heart sounds: No murmur heard.   No gallop.  Pulmonary:     Effort: Pulmonary effort is normal.     Breath sounds: Normal breath sounds. No wheezing or rales.  Musculoskeletal:     Cervical back: Neck supple.     Right lower leg: No edema.     Left lower leg: No edema.  Lymphadenopathy:     Cervical: No cervical adenopathy.  Neurological:     Mental Status: He is alert.           Assessment & Plan:

## 2020-11-12 ENCOUNTER — Other Ambulatory Visit: Payer: Medicare HMO

## 2020-11-19 ENCOUNTER — Encounter: Payer: Self-pay | Admitting: Internal Medicine

## 2020-11-19 ENCOUNTER — Ambulatory Visit (INDEPENDENT_AMBULATORY_CARE_PROVIDER_SITE_OTHER): Payer: Medicare HMO | Admitting: Internal Medicine

## 2020-11-19 ENCOUNTER — Other Ambulatory Visit: Payer: Self-pay

## 2020-11-19 VITALS — BP 120/84 | HR 74 | Temp 97.5°F | Ht 69.25 in | Wt 183.0 lb

## 2020-11-19 DIAGNOSIS — Z0000 Encounter for general adult medical examination without abnormal findings: Secondary | ICD-10-CM | POA: Diagnosis not present

## 2020-11-19 DIAGNOSIS — D472 Monoclonal gammopathy: Secondary | ICD-10-CM

## 2020-11-19 DIAGNOSIS — M65342 Trigger finger, left ring finger: Secondary | ICD-10-CM | POA: Diagnosis not present

## 2020-11-19 DIAGNOSIS — I700 Atherosclerosis of aorta: Secondary | ICD-10-CM | POA: Diagnosis not present

## 2020-11-19 DIAGNOSIS — M542 Cervicalgia: Secondary | ICD-10-CM

## 2020-11-19 DIAGNOSIS — Z7189 Other specified counseling: Secondary | ICD-10-CM

## 2020-11-19 DIAGNOSIS — I10 Essential (primary) hypertension: Secondary | ICD-10-CM

## 2020-11-19 DIAGNOSIS — G4733 Obstructive sleep apnea (adult) (pediatric): Secondary | ICD-10-CM

## 2020-11-19 NOTE — Assessment & Plan Note (Signed)
I have personally reviewed the Medicare Annual Wellness questionnaire and have noted 1. The patient's medical and social history 2. Their use of alcohol, tobacco or illicit drugs 3. Their current medications and supplements 4. The patient's functional ability including ADL's, fall risks, home safety risks and hearing or visual             impairment. 5. Diet and physical activities 6. Evidence for depression or mood disorders  The patients weight, height, BMI and visual acuity have been recorded in the chart I have made referrals, counseling and provided education to the patient based review of the above and I have provided the pt with a written personalized care plan for preventive services.  I have provided you with a copy of your personalized plan for preventive services. Please take the time to review along with your updated medication list.  Colon due next year Recent PSA normal Discussed adding resistance exercise Needs to get bivalent COVID vaccine shingrix when covered

## 2020-11-19 NOTE — Assessment & Plan Note (Signed)
BP Readings from Last 3 Encounters:  11/19/20 120/84  10/08/20 132/82  09/01/20 (!) 156/74   Good control on amlodipine and hygroton OTC potassium

## 2020-11-19 NOTE — Assessment & Plan Note (Signed)
Just uses a tooth guard Wife notes this helps

## 2020-11-19 NOTE — Assessment & Plan Note (Signed)
Keeps up with Dr Lindi Adie

## 2020-11-19 NOTE — Assessment & Plan Note (Signed)
On imaging No statin for now 

## 2020-11-19 NOTE — Patient Instructions (Signed)
Please get your bivalent COVID booster

## 2020-11-19 NOTE — Progress Notes (Signed)
Vision Screening   Right eye Left eye Both eyes  Without correction     With correction 0 0 20/15  Hearing Screening - Comments:: Passed whisper test

## 2020-11-19 NOTE — Assessment & Plan Note (Signed)
Discussed using heat wrap on his right SCM area

## 2020-11-19 NOTE — Assessment & Plan Note (Signed)
Will set up with Dr Lorelei Pont for cortisone injection

## 2020-11-19 NOTE — Progress Notes (Signed)
Subjective:    Patient ID: Chayse Zatarain, male    DOB: 04-Sep-1950, 70 y.o.   MRN: [id number]  HPI Here for Medicare wellness visit and follow up of chronic health conditions This visit occurred during the SARS-CoV-2 public health emergency.  Safety protocols were in place, including screening questions prior to the visit, additional usage of staff PPE, and extensive cleaning of exam room while observing appropriate contact time as indicated for disinfecting solutions.   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 No falls No depression or anhedonia Slight memory issues  Doing okay on the BP meds No chest pian or SOB No dizziness or syncope No edema No headaches  Still has some right neck pain Fairly constant No radiation down arm No arm weakness  Known aortic atherosclerosis On imaging  Continues with hematology for monoclonal gammopathy No fatigue No abnormal bruising or bleeding  Current Outpatient Medications on File Prior to Visit  Medication Sig Dispense Refill   amLODipine (NORVASC) 5 MG tablet TAKE 1 TABLET BY MOUTH EVERY DAY 90 tablet 3   chlorthalidone (HYGROTON) 25 MG tablet Take 1 tablet (25 mg total) by mouth daily. 90 tablet 3   sildenafil (REVATIO) 20 MG tablet TAKE 3 TO 5 TABLETS BY MOUTH DAILY AS NEEDED 50 tablet 11   triamcinolone cream (KENALOG) 0.1 % Apply 1 application topically 2 (two) times daily as needed. 45 g 1   No current facility-administered medications on file prior to visit.    Allergies  Allergen Reactions   Aspirin     REACTION: toxic reaction as a child--but this is unclear   Lisinopril-Hydrochlorothiazide     REACTION: itching    Past Medical History:  Diagnosis  Date   Colon polyps    ED (erectile dysfunction)    GERD (gastroesophageal reflux disease)    Hyperlipidemia    Hypertension    OSA (obstructive sleep apnea)     Past Surgical History:  Procedure Laterality Date   hepatitis ( prob A)  1978    Family History  Problem Relation Age of Onset   Hydrocephalus Mother        shunt   Arthritis Mother    Diabetes Mother    Hypertension Father    Heart attack Maternal Grandmother    Diabetes Maternal Aunt    Breast cancer Sister    Colon cancer Neg Hx    Esophageal cancer Neg Hx    Rectal cancer Neg Hx    Stomach cancer Neg Hx     Social History   Socioeconomic History   Marital status: Married    Spouse name: Not on file   Number of children: 4   Years of education: Not on file   Highest education level: Not on file  Occupational History   Occupation: owner    Comment: [employer]  Tobacco Use   Smoking status: Never   Smokeless tobacco: Never  Substance and Sexual Activity   Alcohol use: Yes    Comment: rare   Drug use: No   Sexual activity: Yes    Birth control/protection: Abstinence  Other Topics Concern   Not on file  Social History Narrative   Pt is a Teacher, early years/pre.      Has living will    Wife is health  care POA---alternate would be children   Would accept resuscitation   Would accept tube feeds   Social Determinants of Health   Financial Resource Strain: Not on file  Food Insecurity: Not on file  Transportation Needs: Not on file  Physical Activity: Not on file  Stress: Not on file  Social Connections: Not on file  Intimate Partner Violence: Not on file   Review of Systems Appetite is okay Weight is stable Sleeps okay Wears seat belt Teeth are okay--sees dentist No suspicious skin lesions---just has line in nail both great toes Trigger finger ---several    Objective:   Physical Exam Constitutional:      Appearance: Normal appearance.  HENT:     Mouth/Throat:     Comments:  No lesions  Eyes:     Conjunctiva/sclera: Conjunctivae normal.     Pupils: Pupils are equal, round, and reactive to light.  Cardiovascular:     Rate and Rhythm: Normal rate and regular rhythm.     Pulses: Normal pulses.     Heart sounds: No murmur heard.   No gallop.  Pulmonary:     Effort: Pulmonary effort is normal.     Breath sounds: Normal breath sounds. No wheezing or rales.  Abdominal:     Palpations: Abdomen is soft.     Tenderness: There is no abdominal tenderness.  Musculoskeletal:     Cervical back: Neck supple.     Right lower leg: No edema.     Left lower leg: No edema.  Lymphadenopathy:     Cervical: No cervical adenopathy.  Skin:    Findings: No lesion or rash.     Comments: Discolored slightly mycotic nails  Neurological:     Mental Status: He is alert and oriented to person, place, and time.     Comments: President---"Biden, Trump, Obama" [id number] Recall 3/3  Psychiatric:        Mood and Affect: Mood normal.        Behavior: Behavior normal.           Assessment & Plan:

## 2020-11-19 NOTE — Assessment & Plan Note (Signed)
See social history 

## 2020-11-26 ENCOUNTER — Other Ambulatory Visit: Payer: Self-pay

## 2020-11-26 ENCOUNTER — Inpatient Hospital Stay: Payer: Medicare HMO | Attending: Hematology and Oncology

## 2020-11-26 ENCOUNTER — Other Ambulatory Visit: Payer: Self-pay | Admitting: *Deleted

## 2020-11-26 DIAGNOSIS — D472 Monoclonal gammopathy: Secondary | ICD-10-CM | POA: Insufficient documentation

## 2020-11-26 LAB — CMP (CANCER CENTER ONLY)
ALT: 29 U/L (ref 0–44)
AST: 23 U/L (ref 15–41)
Albumin: 4.4 g/dL (ref 3.5–5.0)
Alkaline Phosphatase: 68 U/L (ref 38–126)
Anion gap: 9 (ref 5–15)
BUN: 14 mg/dL (ref 8–23)
CO2: 30 mmol/L (ref 22–32)
Calcium: 9.2 mg/dL (ref 8.9–10.3)
Chloride: 100 mmol/L (ref 98–111)
Creatinine: 0.88 mg/dL (ref 0.61–1.24)
GFR, Estimated: >60 mL/min (ref >60)
Glucose, Bld: 139 mg/dL — ABNORMAL HIGH (ref 70–99)
Potassium: 3.2 mmol/L — ABNORMAL LOW (ref 3.5–5.1)
Sodium: 139 mmol/L (ref 135–145)
Total Bilirubin: 1.7 mg/dL — ABNORMAL HIGH (ref 0.3–1.2)
Total Protein: 7.7 g/dL (ref 6.5–8.1)

## 2020-11-26 LAB — CBC WITH DIFFERENTIAL (CANCER CENTER ONLY)
Abs Immature Granulocytes: 0.03 10*3/uL (ref 0.00–0.07)
Basophils Absolute: 0 10*3/uL (ref 0.0–0.1)
Basophils Relative: 0 %
Eosinophils Absolute: 0.1 10*3/uL (ref 0.0–0.5)
Eosinophils Relative: 2 %
HCT: 44.3 % (ref 39.0–52.0)
Hemoglobin: 15.1 g/dL (ref 13.0–17.0)
Immature Granulocytes: 0 %
Lymphocytes Relative: 25 %
Lymphs Abs: 1.8 10*3/uL (ref 0.7–4.0)
MCH: 29.4 pg (ref 26.0–34.0)
MCHC: 34.1 g/dL (ref 30.0–36.0)
MCV: 86.2 fL (ref 80.0–100.0)
Monocytes Absolute: 0.6 10*3/uL (ref 0.1–1.0)
Monocytes Relative: 8 %
Neutro Abs: 4.6 10*3/uL (ref 1.7–7.7)
Neutrophils Relative %: 65 %
Platelet Count: 295 10*3/uL (ref 150–400)
RBC: 5.14 MIL/uL (ref 4.22–5.81)
RDW: 12.2 % (ref 11.5–15.5)
WBC Count: 7.1 10*3/uL (ref 4.0–10.5)
nRBC: 0 % (ref 0.0–0.2)

## 2020-11-27 LAB — BETA 2 MICROGLOBULIN, SERUM: Beta-2 Microglobulin: 1.5 mg/L (ref 0.6–2.4)

## 2020-11-29 LAB — KAPPA/LAMBDA LIGHT CHAINS
Kappa free light chain: 27.7 mg/L — ABNORMAL HIGH (ref 3.3–19.4)
Kappa, lambda light chain ratio: 2.72 — ABNORMAL HIGH (ref 0.26–1.65)
Lambda free light chains: 10.2 mg/L (ref 5.7–26.3)

## 2020-11-30 LAB — MULTIPLE MYELOMA PANEL, SERUM
Albumin SerPl Elph-Mcnc: 3.9 g/dL (ref 2.9–4.4)
Albumin/Glob SerPl: 1.4 (ref 0.7–1.7)
Alpha 1: 0.2 g/dL (ref 0.0–0.4)
Alpha2 Glob SerPl Elph-Mcnc: 0.6 g/dL (ref 0.4–1.0)
B-Globulin SerPl Elph-Mcnc: 1.3 g/dL (ref 0.7–1.3)
Gamma Glob SerPl Elph-Mcnc: 0.8 g/dL (ref 0.4–1.8)
Globulin, Total: 3 g/dL (ref 2.2–3.9)
IgA: 657 mg/dL — ABNORMAL HIGH (ref 61–437)
IgG (Immunoglobin G), Serum: 1016 mg/dL (ref 603–1613)
IgM (Immunoglobulin M), Srm: 46 mg/dL (ref 20–172)
M Protein SerPl Elph-Mcnc: 0.4 g/dL — ABNORMAL HIGH
Total Protein ELP: 6.9 g/dL (ref 6.0–8.5)

## 2020-12-01 NOTE — Progress Notes (Signed)
Patient Care Team: Venia Carbon, MD as PCP - General  DIAGNOSIS:    ICD-10-CM   1. MGUS (monoclonal gammopathy of unknown significance)  D47.2 CBC with Differential ([facility]cer Center Only)    CMP ([place] only)    Kappa/lambda light chains    Multiple Myeloma Panel (SPEP&IFE w/QIG)    Beta 2 microglobulin, serum      CHIEF COMPLIANT: Follow-up of MGUS  INTERVAL HISTORY: Dakota Branch is a 70 y.o. with above-mentioned history of MGUS. Labs on 11/26/2020 showed Hg 15.1, platelets 295, creatinine 0.88, m-protein 0.4, kappa lambda light chain ratio 2.72, beta-2 microglobulin 1.5. He presents to the clinic today for follow-up.   ALLERGIES:  is allergic to aspirin and lisinopril-hydrochlorothiazide.  MEDICATIONS:  Current Outpatient Medications  Medication Sig Dispense Refill   amLODipine (NORVASC) 5 MG tablet TAKE 1 TABLET BY MOUTH EVERY DAY 90 tablet 3   chlorthalidone (HYGROTON) 25 MG tablet Take 1 tablet (25 mg total) by mouth daily. 90 tablet 3   sildenafil (REVATIO) 20 MG tablet TAKE 3 TO 5 TABLETS BY MOUTH DAILY AS NEEDED 50 tablet 11   triamcinolone cream (KENALOG) 0.1 % Apply 1 application topically 2 (two) times daily as needed. 45 g 1   No current facility-administered medications for this visit.    PHYSICAL EXAMINATION: ECOG PERFORMANCE STATUS: 1 - Symptomatic but completely ambulatory  Vitals:   12/02/20 1542  BP: (!) 154/82  Pulse: 91  Resp: 18  Temp: 98.1 F (36.7 C)  SpO2: 99%   Filed Weights   12/02/20 1542  Weight: 183 lb 1.6 oz (83.1 kg)    LABORATORY DATA:  I have reviewed the data as listed CMP Latest Ref Rng & Units 11/26/2020 10/08/2020 11/19/2019  Glucose 70 - 99 mg/dL 139(H) 114(H) 106(H)  BUN 8 - 23 mg/dL '14 14 12  ' Creatinine 0.61 - 1.24 mg/dL 0.88 0.90 0.84  Sodium 135 - 145 mmol/L 139 139 140  Potassium 3.5 - 5.1 mmol/L 3.2(L) 3.4(L) 4.1  Chloride 98 - 111 mmol/L 100 95(L) 104  CO2 22 - 32 mmol/L 30 37(H) 28  Calcium 8.9 -  10.3 mg/dL 9.2 9.7 9.1  Total Protein 6.5 - 8.1 g/dL 7.7 7.3 7.4  Total Bilirubin 0.3 - 1.2 mg/dL 1.7(H) 1.3(H) 2.5(H)  Alkaline Phos 38 - 126 U/L 68 65 76  AST 15 - 41 U/L '23 23 21  ' ALT 0 - 44 U/L '29 25 28    ' Lab Results  Component Value Date   WBC 7.1 11/26/2020   HGB 15.1 11/26/2020   HCT 44.3 11/26/2020   MCV 86.2 11/26/2020   PLT 295 11/26/2020   NEUTROABS 4.6 11/26/2020    ASSESSMENT & PLAN:  MGUS (monoclonal gammopathy of unknown significance) IgA kappa monoclonal protein (work-up was performed for neuropathy evaluation) M protein was not measurable No evidence of multiple myeloma.  Creatinine 0.81, calcium 9.3, albumin 4.3, hemoglobin 14.3 Bone survey 05/30/2019: No osseous metastatic disease.   Lab review  11/20/2019: Beta-2 microglobulin 1.5, creatinine 0.84, albumin 4.1, total bilirubin 2.5, hemoglobin 14.3, M protein 0.4 g IgA kappa, kappa 24.5, lambda 11.2, ratio 2.19 11/26/2020: M protein 0.4 g (was 0.4 g) IgA kappa, kappa 27.7, ratio 2.72, beta-2 microglobulin 1.5, creatinine 0.88, calcium 9.2, albumin 4.4, hemoglobin 15.1  I discussed with the patient that there is no any change in the M protein or the other parameters. He is an [place].  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  He  does not enjoy that work but it helps pay his bills.  Recheck labs in 1 year and follow-up after that    Orders Placed This Encounter  Procedures   CBC with Differential ([place] Only)    Standing Status:   Future    Standing Expiration Date:   12/02/2021   CMP ([place] only)    Standing Status:   Future    Standing Expiration Date:   12/02/2021   Kappa/lambda light chains    Standing Status:   Future    Standing Expiration Date:   12/02/2021   Multiple Myeloma Panel (SPEP&IFE w/QIG)    Standing Status:   Future    Standing Expiration Date:   12/02/2021   Beta 2 microglobulin, serum    Standing Status:   Future    Standing Expiration Date:    12/02/2021    The patient has a good understanding of the overall plan. he agrees with it. he will call with any problems that may develop before the next visit here.  Total time spent: 20 mins including face to face time and time spent for planning, charting and coordination of care  Rulon Eisenmenger, MD, MPH 12/02/2020  I, Thana Ates, am acting as scribe for Dr. Nicholas Lose.  I have reviewed the above documentation for accuracy and completeness, and I agree with the above.

## 2020-12-02 ENCOUNTER — Ambulatory Visit: Payer: Medicare HMO | Admitting: Family Medicine

## 2020-12-02 ENCOUNTER — Other Ambulatory Visit: Payer: Self-pay

## 2020-12-02 ENCOUNTER — Inpatient Hospital Stay: Payer: Medicare HMO | Attending: Hematology and Oncology | Admitting: Hematology and Oncology

## 2020-12-02 DIAGNOSIS — D472 Monoclonal gammopathy: Secondary | ICD-10-CM | POA: Insufficient documentation

## 2020-12-02 DIAGNOSIS — Z79899 Other long term (current) drug therapy: Secondary | ICD-10-CM | POA: Insufficient documentation

## 2020-12-02 NOTE — Assessment & Plan Note (Signed)
IgA kappa monoclonal protein (work-up was performed for neuropathy evaluation) M protein was not measurable No evidence of multiple myeloma. Creatinine 0.81, calcium 9.3, albumin 4.3, hemoglobin 14.3 Bone survey 05/30/2019: No osseous metastatic disease.  Lab review  11/20/2019: Beta-2 microglobulin 1.5, creatinine 0.84, albumin 4.1, total bilirubin 2.5, hemoglobin 14.3, M protein 0.4 g IgA kappa, kappa 24.5, lambda 11.2, ratio 2.19 11/26/2020: M protein 0.4 g (was 0.4 g) IgA kappa, kappa 27.7, ratio 2.72, beta-2 microglobulin 1.5, creatinine 0.88, calcium 9.2, albumin 4.4, hemoglobin 15.1  I discussed with the patient that there is no any change in the M protein or the other parameters. Recheck labs in 1 year and follow-up after that

## 2020-12-06 ENCOUNTER — Other Ambulatory Visit: Payer: Self-pay

## 2020-12-06 ENCOUNTER — Encounter: Payer: Self-pay | Admitting: Family Medicine

## 2020-12-06 ENCOUNTER — Telehealth (INDEPENDENT_AMBULATORY_CARE_PROVIDER_SITE_OTHER): Payer: Medicare HMO | Admitting: Family Medicine

## 2020-12-06 VITALS — BP 159/62 | HR 77 | Temp 98.6°F | Ht 69.25 in | Wt 186.0 lb

## 2020-12-06 DIAGNOSIS — U071 COVID-19: Secondary | ICD-10-CM

## 2020-12-06 DIAGNOSIS — D472 Monoclonal gammopathy: Secondary | ICD-10-CM | POA: Diagnosis not present

## 2020-12-06 MED ORDER — NIRMATRELVIR/RITONAVIR (PAXLOVID)TABLET: 3.0000 | ORAL_TABLET | Freq: Two times a day (BID) | ORAL | 0 refills | Status: DC

## 2020-12-06 NOTE — Progress Notes (Signed)
Dakota Grabel T. Dwane Andres, MD [facility] [place], [address] Phone: [phone]  FAX: [phone]  Rober Skeels - 70 y.o. male  MRN [id number]  Date of Birth: [birth year]  Visit Date: 12/06/2020  PCP: Venia Carbon, MD  Referred by: Venia Carbon, MD  Virtual Visit via Video Note:  I connected with  Dakota Branch on 12/06/2020  2:00 PM EST by a video enabled telemedicine application and verified that I am speaking with the correct person using two identifiers.   Location patient: home computer, tablet, or smartphone Location provider: work or home office Consent: Verbal consent directly obtained from [place]. Persons participating in the virtual visit: patient, provider  I discussed the limitations of evaluation and management by telemedicine and the availability of in person appointments. The patient expressed understanding and agreed to proceed.  Chief Complaint  Patient presents with   Covid Positive    Positive home test yesterday-Symptoms started on Friday-wife and son also tested positive   Sore Throat   Cough    History of Present Illness:  Sx started on Friday.  He also had a sore throat that significant and cough, that not particularly productive.  2 days ago.   No fever.  Ribs are hurting a bit. No GI symptoms.  No nasal congestion.    Review of Systems as above: See pertinent positives and pertinent negatives per HPI No acute distress verbally   Observations/Objective/Exam:  An attempt was made to discern vital signs over the phone and per patient if applicable and possible.   General:    Alert, Oriented, appears well and in no acute distress  Pulmonary:     On inspection no signs of respiratory distress.  Psych / Neurological:     Pleasant and cooperative.  Assessment and Plan:    ICD-10-CM   1. COVID-19  U07.1 nirmatrelvir/ritonavir  EUA (PAXLOVID) 20 x 150 MG & 10 x 100MG  TABS    2. MGUS (monoclonal gammopathy of unknown significance)  D47.2      Total encounter time: 35 minutes. This includes total time spent on the day of encounter.  I did my best to answer all of his questions about COVID as well as potential side effects from Paxlovid.  He also had questions about Molnupiravir.  He also had me talk to his wife, triage her situation, and ultimately I did call her in some antivirals as well.  She does appear stable on exam, and I detailed this more in her chart.  He also asked me about his son, and ultimately I told him that his adult son would need to be evaluated for any additional treatment  Reviewed isolation and supportive care.  I discussed the assessment and treatment plan with the patient. The patient was provided an opportunity to ask questions and all were answered. The patient agreed with the plan and demonstrated an understanding of the instructions.   The patient was advised to call back or seek an in-person evaluation if the symptoms worsen or if the condition fails to improve as anticipated.  Follow-up: prn unless noted otherwise below No follow-ups on file.  Meds ordered this encounter  Medications   nirmatrelvir/ritonavir EUA (PAXLOVID) 20 x 150 MG & 10 x 100MG  TABS    Sig: Take 3 tablets by mouth 2 (two) times daily for 5 days. (Take nirmatrelvir 150 mg two tablets twice daily for 5 days  and ritonavir 100 mg one tablet twice daily for 5 days) Patient GFR is 60    Dispense:  30 tablet    Refill:  0   No orders of the defined types were placed in this encounter.   Signed,  Maud Deed. Jaicion Laurie, MD

## 2020-12-07 ENCOUNTER — Telehealth: Payer: Self-pay

## 2020-12-07 NOTE — Telephone Encounter (Signed)
I spoke with Dakota Branch;for several days Dakota Branch has had neck pain and elevated BP Dakota Branch has not missed any BP med. No CP,SOB or vision changes. Dakota Branch already has appt with Dr Silvio Pate scheduled on 12/08/20 at 8:15. Pts wife wants to kow why I am calling and I advised I was calling to verify Dakota Branch not having any CP,H/A, SOB. UC & ED precautions given and Dakota Branch voiced understanding and appreciative. Dakota Branch plans on keeping appt with Dr Silvio Pate. Sending note to Dr Silvio Pate who is out of office; [facility] CMA and Dr Diona Browner who is in office.

## 2020-12-07 NOTE — Telephone Encounter (Signed)
Dakota Branch - Client TELEPHONE ADVICE RECORD [facility] Patient Name: Dakota Branch Gender: Male DOB: 05-Oct-1950 Age: 70 Y 3 M 25 D Return Phone Number: [phone] (Primary), [phone] (Secondary) Address: City/ State/ Zip: [place]  [address] Client Dakota Branch - Client Client Site [facility] Contact Type Call Who Is Calling Patient / Member / Family / Caregiver Call Type Triage / Clinical Relationship To Patient Self Return Phone Number [phone] (Primary) Chief Complaint Neck Pain Reason for Call Symptomatic / Request for [place] transferring call -- patient has an appt tomorrow caller appt is blood pressure is 169/98.Marland Kitchen left side of his neck in the back is painful so is his thigh  Translation No Nurse Assessment Nurse: Dakota Barry, RN, Levada Dy Date/Time Eilene Ghazi Time): 12/07/2020 3:20:22 PM Confirm and document reason for call. If symptomatic, describe symptoms. ---[name] transferring call -- patient has an appt tomorrow caller appt is blood pressure is 169/98. Does the patient have any new or worsening symptoms? ---Yes Will a triage be completed? ---Yes Related visit to physician within the last 2 weeks? ---No Does the PT have any chronic conditions? (i.e. diabetes, asthma, this includes High risk factors for pregnancy, etc.) ---Yes List chronic conditions. ---HTN Is this a behavioral health or substance abuse call? ---No Guidelines Guideline Title Affirmed Question Affirmed Notes Nurse Date/Time (Eastern Time) Blood Pressure - High Systolic BP >= 761 OR Diastolic >= 607 Deaton, RN, Levada Dy 12/07/2020 3:21:49 PM Hip Pain [1] MODERATE pain (e.g., interferes with normal activities, limping) AND [2] present > 3 days Dakota Barry, RN, Levada Dy 12/07/2020 3:23:55 PM PLEASE NOTE: All timestamps contained within this report are represented as  [place] Standard Time. CONFIDENTIALTY NOTICE: This fax transmission is intended only for the addressee. It contains information that is legally privileged, confidential or otherwise protected from use or disclosure. If you are not the intended recipient, you are strictly prohibited from reviewing, disclosing, copying using or disseminating any of this information or taking any action in reliance on or regarding this information. If you have received this fax in error, please notify us immediately by telephone so that we can arrange for its return to [place]. Phone: [phone], Toll-Free: [phone], Fax: [phone] Page: 2 of 2 Call Id: [id number] [place]. Time Eilene Ghazi Time) Disposition Final User 12/07/2020 3:02:01 PM Attempt made - message left Deaton, RN, Levada Dy 12/07/2020 3:23:28 PM SEE PCP WITHIN 3 DAYS Deaton, RN, Levada Dy 12/07/2020 3:28:14 PM SEE PCP WITHIN 3 DAYS Yes Deaton, RN, Cindee Lame Disagree/Comply Comply Caller Understands Yes PreDisposition Did not know what to do Care Advice Given Per Guideline SEE PCP WITHIN 3 DAYS: * You need to be seen within 2 or 3 days. HIGH BLOOD PRESSURE: * REDUCE STRESS: Find activities that help reduce your stress. Examples might include meditation, yoga, or even a restful walk in a park. CALL BACK IF: * Weakness or numbness of the face, arm or leg on one side of the body occurs * Difficulty walking, difficulty talking, or severe headache occurs * Chest pain or difficulty breathing occurs * Your blood pressure is over 180/110 * You become worse CARE ADVICE given per High Blood Pressure (Adult) guideline. SEE PCP WITHIN 3 DAYS: * You need to be seen within 2 or 3 days. REST YOUR HIP FOR THE NEXT COUPLE DAYS: * Avoid activities that worsen your pain. * Try sleeping on the side that is not painful. A pillow slightly placed under  the painful hip or under the knee may be helpful. * Reduce activities that put a lot of strain on the hip joint (e.g., stair  climbing, running). LOCAL HEAT: * Apply a warm wet washcloth or heating pad for 10 minutes three times daily. * This will help to increase circulation and improve healing. * ACETAMINOPHEN - EXTRA STRENGTH TYLENOL: Take 1,000 mg (two 500 mg pills) every 6 to 8 hours as needed. Each Extra Strength Tylenol pill has 500 mg of acetaminophen. The most you should take is 6 pills a Branch (3,000 mg total). Note: In [place], the maximum is 8 pills a Branch (4,000 mg total). CALL BACK IF: * Fever or severe pain occurs * Skin redness or a rash appears * You become worse CARE ADVICE given per Hip Pain (Adult) guideline. Comments User: Saverio Danker, RN Date/Time Eilene Ghazi Time): 12/07/2020 3:02:23 PM messages left on both numbers provided. Referrals REFERRED TO PCP OFFIC

## 2020-12-08 ENCOUNTER — Encounter: Payer: Self-pay | Admitting: Internal Medicine

## 2020-12-08 ENCOUNTER — Other Ambulatory Visit: Payer: Self-pay

## 2020-12-08 ENCOUNTER — Ambulatory Visit (INDEPENDENT_AMBULATORY_CARE_PROVIDER_SITE_OTHER): Payer: Medicare HMO | Admitting: Internal Medicine

## 2020-12-08 VITALS — BP 138/74 | HR 68 | Temp 97.8°F | Ht 69.25 in | Wt 180.0 lb

## 2020-12-08 DIAGNOSIS — I10 Essential (primary) hypertension: Secondary | ICD-10-CM

## 2020-12-08 DIAGNOSIS — R0609 Other forms of dyspnea: Secondary | ICD-10-CM | POA: Insufficient documentation

## 2020-12-08 NOTE — H&P (View-Only) (Signed)
Cardiology Office Note   Date:  12/09/2020   ID:  Dakota, Branch 1950/07/08, MRN [id number]  PCP:  Venia Carbon, MD  Cardiologist:   None Referring:  Venia Carbon, MD  Chief Complaint  Patient presents with   Hypertension       History of Present Illness: Dakota Branch is a 70 y.o. male who is referred by Venia Carbon, MD for evaluation of SOB.  He reports to me that he really does not feel a lot of shortness of breath.  He does some walking at work though not for exercise.  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  He denies any chest pressure, neck or arm discomfort.  He does not report any palpitations, presyncope or syncope.  He has no PND or orthopnea.  He has had some difficult to control hypertension and he was requesting a stress test.  Past Medical History:  Diagnosis Date   Colon polyps    ED (erectile dysfunction)    GERD (gastroesophageal reflux disease)    Hyperlipidemia    Hypertension    OSA (obstructive sleep apnea)     Past Surgical History:  Procedure Laterality Date   hepatitis ( prob A)  1978     Current Outpatient Medications  Medication Sig Dispense Refill   amLODipine (NORVASC) 5 MG tablet TAKE 1 TABLET BY MOUTH EVERY DAY 90 tablet 3   chlorthalidone (HYGROTON) 25 MG tablet Take 1 tablet (25 mg total) by mouth daily. 90 tablet 3   sildenafil (REVATIO) 20 MG tablet TAKE 3 TO 5 TABLETS BY MOUTH DAILY AS NEEDED 50 tablet 11   triamcinolone cream (KENALOG) 0.1 % Apply 1 application topically 2 (two) times daily as needed. 45 g 1   No current facility-administered medications for this visit.    Allergies:   Aspirin and Lisinopril-hydrochlorothiazide    Social History:  The patient  reports that he has never smoked. He has never used smokeless tobacco. He reports current alcohol use. He reports that he does not use drugs.   Family History:  The patient's family history includes Arthritis in his mother;  Breast cancer in his sister; Diabetes in his maternal aunt and mother; Heart attack in his maternal grandmother; Hydrocephalus in his mother; Hypertension in his father.    ROS:  Please see the history of present illness.   Otherwise, review of systems are positive for none.   All other systems are reviewed and negative.    PHYSICAL EXAM: VS:  BP 130/70 (BP Location: Left Arm, Patient Position: Sitting, Cuff Size: Normal)    Pulse 65    Ht 5\' 9"  (1.753 m)    Wt 180 lb (81.6 kg)    BMI 26.58 kg/m  , BMI Body mass index is 26.58 kg/m. GENERAL:  Well appearing HEENT:  Pupils equal round and reactive, fundi not visualized, oral mucosa unremarkable NECK:  No jugular venous distention, waveform within normal limits, carotid upstroke brisk and symmetric, no bruits, no thyromegaly LYMPHATICS:  No cervical, inguinal adenopathy LUNGS:  Clear to auscultation bilaterally BACK:  No CVA tenderness CHEST:  Unremarkable HEART:  PMI not displaced or sustained,S1 and S2 within normal limits, no S3, no S4, no clicks, no rubs, no murmurs ABD:  Flat, positive bowel sounds normal in frequency in pitch, no bruits, no rebound, no guarding, no midline pulsatile mass, no hepatomegaly, no splenomegaly EXT:  2 plus pulses throughout, no edema, no cyanosis no  clubbing SKIN:  No rashes no nodules NEURO:  Cranial nerves II through XII grossly intact, motor grossly intact throughout PSYCH:  Cognitively intact, oriented to person place and time    EKG:  EKG is ordered today. The ekg ordered today demonstrates sinus rhythm, rate 65, axis within normal limits, intervals within normal limits, no acute ST-T wave changes.   Recent Labs: 11/26/2020: ALT 29; BUN 14; Creatinine 0.88; Hemoglobin 15.1; Platelet Count 295; Potassium 3.2; Sodium 139    Lipid Panel    Component Value Date/Time   CHOL 196 10/08/2020 0950   TRIG 94.0 10/08/2020 0950   HDL 40.80 10/08/2020 0950   CHOLHDL 5 10/08/2020 0950   VLDL 18.8  10/08/2020 0950   LDLCALC 137 (H) 10/08/2020 0950   LDLDIRECT 140.1 02/23/2012 0926      Wt Readings from Last 3 Encounters:  12/09/20 180 lb (81.6 kg)  12/08/20 180 lb (81.6 kg)  12/06/20 186 lb (84.4 kg)      Other studies Reviewed: Additional studies/ records that were reviewed today include: Labs. Review of the above records demonstrates:  Please see elsewhere in the note.     ASSESSMENT AND PLAN:  SOB: This has been a very vague symptom.  He does have some cardiovascular risk factors with dyslipidemia and I reviewed this with him.  I would like to start with a coronary calcium score which will determine goals of therapy and need for further testing.  HTN: His blood pressure is currently well controlled and he had HCTZ added recently.  I would continue with the meds as above.  DYSLIPIDEMIA: LDL was 137 and he might need management based on a calcium score but I will determine goals of therapy after this testing.  Current medicines are reviewed at length with the patient today.  The patient does not have concerns regarding medicines.  The following changes have been made:  no change  Labs/ tests ordered today include:   Orders Placed This Encounter  Procedures   CT CARDIAC SCORING (SELF PAY ONLY)   EKG 12-Lead      Disposition:   FU with as needed.      Signed, Minus Breeding, MD  12/09/2020 10:33 AM    [facility]

## 2020-12-08 NOTE — Assessment & Plan Note (Signed)
Has vague exertional symptoms He is concerned about CAD Will set up evaluation with cardiology

## 2020-12-08 NOTE — Assessment & Plan Note (Addendum)
BP Readings from Last 3 Encounters:  12/08/20 138/74  12/06/20 (!) 159/62  12/02/20 (!) 154/82   His wrist cuff was 168/97 I discussed my concerns about accuracy Will continue the amlodipine and chlorthalidone for now May need other Rx if concerns for CAD If persistent elevations at home, would add ARB

## 2020-12-08 NOTE — Progress Notes (Signed)
Cardiology Office Note   Date:  12/09/2020   ID:  Dakota Branch, Dakota Branch Apr 30, 1950, MRN [id number]  PCP:  Venia Carbon, MD  Cardiologist:   None Referring:  Venia Carbon, MD  Chief Complaint  Patient presents with   Hypertension       History of Present Illness: Dakota Branch is a 70 y.o. male who is referred by Venia Carbon, MD for evaluation of SOB.  He reports to me that he really does not feel a lot of shortness of breath.  He does some walking at work though not for exercise.  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  He denies any chest pressure, neck or arm discomfort.  He does not report any palpitations, presyncope or syncope.  He has no PND or orthopnea.  He has had some difficult to control hypertension and he was requesting a stress test.  Past Medical History:  Diagnosis Date   Colon polyps    ED (erectile dysfunction)    GERD (gastroesophageal reflux disease)    Hyperlipidemia    Hypertension    OSA (obstructive sleep apnea)     Past Surgical History:  Procedure Laterality Date   hepatitis ( prob A)  1978     Current Outpatient Medications  Medication Sig Dispense Refill   amLODipine (NORVASC) 5 MG tablet TAKE 1 TABLET BY MOUTH EVERY DAY 90 tablet 3   chlorthalidone (HYGROTON) 25 MG tablet Take 1 tablet (25 mg total) by mouth daily. 90 tablet 3   sildenafil (REVATIO) 20 MG tablet TAKE 3 TO 5 TABLETS BY MOUTH DAILY AS NEEDED 50 tablet 11   triamcinolone cream (KENALOG) 0.1 % Apply 1 application topically 2 (two) times daily as needed. 45 g 1   No current facility-administered medications for this visit.    Allergies:   Aspirin and Lisinopril-hydrochlorothiazide    Social History:  The patient  reports that he has never smoked. He has never used smokeless tobacco. He reports current alcohol use. He reports that he does not use drugs.   Family History:  The patient's family history includes Arthritis in his mother;  Breast cancer in his sister; Diabetes in his maternal aunt and mother; Heart attack in his maternal grandmother; Hydrocephalus in his mother; Hypertension in his father.    ROS:  Please see the history of present illness.   Otherwise, review of systems are positive for none.   All other systems are reviewed and negative.    PHYSICAL EXAM: VS:  BP 130/70 (BP Location: Left Arm, Patient Position: Sitting, Cuff Size: Normal)   Pulse 65   Ht 5\' 9"  (1.753 m)   Wt 180 lb (81.6 kg)   BMI 26.58 kg/m  , BMI Body mass index is 26.58 kg/m. GENERAL:  Well appearing HEENT:  Pupils equal round and reactive, fundi not visualized, oral mucosa unremarkable NECK:  No jugular venous distention, waveform within normal limits, carotid upstroke brisk and symmetric, no bruits, no thyromegaly LYMPHATICS:  No cervical, inguinal adenopathy LUNGS:  Clear to auscultation bilaterally BACK:  No CVA tenderness CHEST:  Unremarkable HEART:  PMI not displaced or sustained,S1 and S2 within normal limits, no S3, no S4, no clicks, no rubs, no murmurs ABD:  Flat, positive bowel sounds normal in frequency in pitch, no bruits, no rebound, no guarding, no midline pulsatile mass, no hepatomegaly, no splenomegaly EXT:  2 plus pulses throughout, no edema, no cyanosis no clubbing SKIN:  No  rashes no nodules NEURO:  Cranial nerves II through XII grossly intact, motor grossly intact throughout PSYCH:  Cognitively intact, oriented to person place and time    EKG:  EKG is ordered today. The ekg ordered today demonstrates sinus rhythm, rate 65, axis within normal limits, intervals within normal limits, no acute ST-T wave changes.   Recent Labs: 11/26/2020: ALT 29; BUN 14; Creatinine 0.88; Hemoglobin 15.1; Platelet Count 295; Potassium 3.2; Sodium 139    Lipid Panel    Component Value Date/Time   CHOL 196 10/08/2020 0950   TRIG 94.0 10/08/2020 0950   HDL 40.80 10/08/2020 0950   CHOLHDL 5 10/08/2020 0950   VLDL 18.8  10/08/2020 0950   LDLCALC 137 (H) 10/08/2020 0950   LDLDIRECT 140.1 02/23/2012 0926      Wt Readings from Last 3 Encounters:  12/09/20 180 lb (81.6 kg)  12/08/20 180 lb (81.6 kg)  12/06/20 186 lb (84.4 kg)      Other studies Reviewed: Additional studies/ records that were reviewed today include: Labs. Review of the above records demonstrates:  Please see elsewhere in the note.     ASSESSMENT AND PLAN:  SOB: This has been a very vague symptom.  He does have some cardiovascular risk factors with dyslipidemia and I reviewed this with him.  I would like to start with a coronary calcium score which will determine goals of therapy and need for further testing.  HTN: His blood pressure is currently well controlled and he had HCTZ added recently.  I would continue with the meds as above.  DYSLIPIDEMIA: LDL was 137 and he might need management based on a calcium score but I will determine goals of therapy after this testing.  Current medicines are reviewed at length with the patient today.  The patient does not have concerns regarding medicines.  The following changes have been made:  no change  Labs/ tests ordered today include:   Orders Placed This Encounter  Procedures   CT CARDIAC SCORING (SELF PAY ONLY)   EKG 12-Lead      Disposition:   FU with as needed.      Signed, Minus Breeding, MD  12/09/2020 10:33 AM    [facility]

## 2020-12-08 NOTE — Progress Notes (Signed)
Subjective:    Patient ID: Dakota Branch, male    DOB: October 02, 1950, 70 y.o.   MRN: [id number]  HPI Here due to concerns about BP being elevated still This visit occurred during the SARS-CoV-2 public health emergency.  Safety protocols were in place, including screening questions prior to the visit, additional usage of staff PPE, and extensive cleaning of exam room while observing appropriate contact time as indicated for disinfecting solutions.   Recent COVID infection---sore throat improved before he took paxlovid, so he didn't take it  BP at Dr Geralyn Flash [id number]--998 systolic 338 at home No headache No vision changes  Has pain in posterior left hip and down leg Worse sitting---better when getting up Discussed apparent sciatic pain (and need to avoid checking BP   Current Outpatient Medications on File Prior to Visit  Medication Sig Dispense Refill   amLODipine (NORVASC) 5 MG tablet TAKE 1 TABLET BY MOUTH EVERY DAY 90 tablet 3   chlorthalidone (HYGROTON) 25 MG tablet Take 1 tablet (25 mg total) by mouth daily. 90 tablet 3   sildenafil (REVATIO) 20 MG tablet TAKE 3 TO 5 TABLETS BY MOUTH DAILY AS NEEDED 50 tablet 11   triamcinolone cream (KENALOG) 0.1 % Apply 1 application topically 2 (two) times daily as needed. 45 g 1   No current facility-administered medications on file prior to visit.    Allergies  Allergen Reactions   Aspirin     REACTION: toxic reaction as a child--but this is unclear   Lisinopril-Hydrochlorothiazide     REACTION: itching    Past Medical History:  Diagnosis Date   Colon polyps    ED (erectile dysfunction)    GERD (gastroesophageal reflux disease)    Hyperlipidemia    Hypertension    OSA (obstructive sleep apnea)     Past Surgical History:  Procedure Laterality Date   hepatitis ( prob A)  1978    Family History  Problem Relation Age of Onset   Hydrocephalus Mother        shunt   Arthritis Mother    Diabetes Mother    Hypertension  Father    Heart attack Maternal Grandmother    Diabetes Maternal Aunt    Breast cancer Sister    Colon cancer Neg Hx    Esophageal cancer Neg Hx    Rectal cancer Neg Hx    Stomach cancer Neg Hx     Social History   Socioeconomic History   Marital status: Married    Spouse name: Not on file   Number of children: 4   Years of education: Not on file   Highest education level: Not on file  Occupational History   Occupation: owner    Comment: [employer]  Tobacco Use   Smoking status: Never   Smokeless tobacco: Never  Substance and Sexual Activity   Alcohol use: Yes    Comment: rare   Drug use: No   Sexual activity: Yes    Birth control/protection: Abstinence  Other Topics Concern   Not on file  Social History Narrative   Pt is a Teacher, early years/pre.      Has living will    Wife is health care POA---alternate would be children   Would accept resuscitation   Would accept tube feeds   Social Determinants of Health   Financial Resource Strain: Not on file  Food Insecurity: Not on file  Transportation Needs: Not on file  Physical Activity: Not on file  Stress: Not on file  Social Connections: Not on file  Intimate Partner Violence: Not on file   Review of Systems Wonders about getting stress test--no set exercise Gets tired with activity but not really DOE     Objective:   Physical Exam Constitutional:      Appearance: Normal appearance.  Cardiovascular:     Rate and Rhythm: Normal rate and regular rhythm.     Heart sounds: No murmur heard.   No gallop.  Pulmonary:     Effort: Pulmonary effort is normal.     Breath sounds: Normal breath sounds.  Musculoskeletal:     Cervical back: Neck supple.     Right lower leg: No edema.     Left lower leg: No edema.  Lymphadenopathy:     Cervical: No cervical adenopathy.  Neurological:     Mental Status: He is alert.           Assessment & Plan:

## 2020-12-08 NOTE — Telephone Encounter (Signed)
Okay I will evaluate him at the [facility] today

## 2020-12-09 ENCOUNTER — Encounter: Payer: Self-pay | Admitting: Cardiology

## 2020-12-09 ENCOUNTER — Ambulatory Visit: Payer: Medicare HMO | Admitting: Cardiology

## 2020-12-09 VITALS — BP 130/70 | HR 65 | Ht 69.0 in | Wt 180.0 lb

## 2020-12-09 DIAGNOSIS — R0602 Shortness of breath: Secondary | ICD-10-CM

## 2020-12-09 DIAGNOSIS — I10 Essential (primary) hypertension: Secondary | ICD-10-CM

## 2020-12-09 DIAGNOSIS — E785 Hyperlipidemia, unspecified: Secondary | ICD-10-CM | POA: Diagnosis not present

## 2020-12-09 NOTE — Patient Instructions (Signed)
Medication Instructions:  Your Physician recommend you continue on your current medication as directed.    *If you need a refill on your cardiac medications before your next appointment, please call your pharmacy*   Testing/Procedures: Dr. Vita Barley, MD has ordered a CT coronary calcium score. This test is done at [address]. This is $99 out of pocket.   Coronary CalciumScan A coronary calcium scan is an imaging test used to look for deposits of calcium and other fatty materials (plaques) in the inner lining of the blood vessels of the heart (coronary arteries). These deposits of calcium and plaques can partly clog and narrow the coronary arteries without producing any symptoms or warning signs. This puts a person at risk for a heart attack. This test can detect these deposits before symptoms develop. Tell a health care provider about: Any allergies you have. All medicines you are taking, including vitamins, herbs, eye drops, creams, and over-the-counter medicines. Any problems you or family members have had with anesthetic medicines. Any blood disorders you have. Any surgeries you have had. Any medical conditions you have. Whether you are pregnant or may be pregnant. What are the risks? Generally, this is a safe procedure. However, problems may occur, including: Harm to a pregnant woman and her unborn baby. This test involves the use of radiation. Radiation exposure can be dangerous to a pregnant woman and her unborn baby. If you are pregnant, you generally should not have this procedure done. Slight increase in the risk of cancer. This is because of the radiation involved in the test. What happens before the procedure? No preparation is needed for this procedure. What happens during the procedure? You will undress and remove any jewelry around your neck or chest. You will put on a hospital gown. Sticky electrodes will be placed on your chest. The electrodes will  be connected to an electrocardiogram (ECG) machine to record a tracing of the electrical activity of your heart. A CT scanner will take pictures of your heart. During this time, you will be asked to lie still and hold your breath for 2-3 seconds while a picture of your heart is being taken. The procedure may vary among health care providers and hospitals. What happens after the procedure? You can get dressed. You can return to your normal activities. It is up to you to get the results of your test. Ask your health care provider, or the department that is doing the test, when your results will be ready. Summary A coronary calcium scan is an imaging test used to look for deposits of calcium and other fatty materials (plaques) in the inner lining of the blood vessels of the heart (coronary arteries). Generally, this is a safe procedure. Tell your health care provider if you are pregnant or may be pregnant. No preparation is needed for this procedure. A CT scanner will take pictures of your heart. You can return to your normal activities after the scan is done. This information is not intended to replace advice given to you by your health care provider. Make sure you discuss any questions you have with your health care provider. Document Released: 06/17/2007 Document Revised: 11/08/2015 Document Reviewed: 11/08/2015 Elsevier Interactive Patient Education  2017 [place]: At [facility], you and your health needs are our priority.  As part of our continuing mission to provide you with exceptional heart care, we have created designated Provider Care Teams.  These Care Teams include your  primary Cardiologist (physician) and Advanced Practice Providers (APPs -  Physician Assistants and Nurse Practitioners) who all work together to provide you with the care you need, when you need it.  We recommend signing up for the patient portal called "MyChart".  Sign up information is provided  on this After Visit Summary.  MyChart is used to connect with patients for Virtual Visits (Telemedicine).  Patients are able to view lab/test results, encounter notes, upcoming appointments, etc.  Non-urgent messages can be sent to your provider as well.   To learn more about what you can do with MyChart, go to [email or web address].    Your next appointment:    As needed.  The format for your next appointment:   In Person  Provider:   Dr. Vita Barley, MD

## 2020-12-16 ENCOUNTER — Ambulatory Visit (HOSPITAL_COMMUNITY)
Admission: RE | Admit: 2020-12-16 | Discharge: 2020-12-16 | Disposition: A | Discharge status: Home / Self Care | Payer: Self-pay | Source: Ambulatory Visit | Attending: Cardiology | Admitting: Cardiology

## 2020-12-16 ENCOUNTER — Other Ambulatory Visit: Payer: Self-pay

## 2020-12-16 DIAGNOSIS — R0602 Shortness of breath: Secondary | ICD-10-CM | POA: Insufficient documentation

## 2020-12-21 ENCOUNTER — Telehealth (HOSPITAL_COMMUNITY): Payer: Self-pay | Admitting: *Deleted

## 2020-12-21 ENCOUNTER — Telehealth: Payer: Self-pay | Admitting: Cardiology

## 2020-12-21 DIAGNOSIS — E785 Hyperlipidemia, unspecified: Secondary | ICD-10-CM

## 2020-12-21 DIAGNOSIS — R0602 Shortness of breath: Secondary | ICD-10-CM

## 2020-12-21 MED ORDER — ROSUVASTATIN CALCIUM 10 MG PO TABS: 10.0000 mg | ORAL_TABLET | Freq: Every day | ORAL | 3 refills | Status: DC

## 2020-12-21 NOTE — Telephone Encounter (Signed)
Close encounter 

## 2020-12-21 NOTE — Telephone Encounter (Signed)
° °  Pt is calling to get calcium score result

## 2020-12-21 NOTE — Telephone Encounter (Signed)
Spoke with pt regarding some questions he had about Dr. Rosezella Florida recommendations. All questions answered. Pt will await a call to schedule exercise tolerance test and begin taking crestor 10mg  once daily. Pt verbalizes understanding.

## 2020-12-21 NOTE — Telephone Encounter (Signed)
Patient is returning Kryestyn's call to go over his results for his CT. Gxt has been scheduled for 12/23/20.

## 2020-12-22 ENCOUNTER — Other Ambulatory Visit: Payer: Self-pay | Admitting: *Deleted

## 2020-12-22 DIAGNOSIS — R0602 Shortness of breath: Secondary | ICD-10-CM

## 2020-12-23 ENCOUNTER — Other Ambulatory Visit: Payer: Self-pay

## 2020-12-23 ENCOUNTER — Other Ambulatory Visit: Payer: Self-pay | Admitting: *Deleted

## 2020-12-23 ENCOUNTER — Ambulatory Visit (HOSPITAL_COMMUNITY)
Admission: RE | Admit: 2020-12-23 | Discharge: 2020-12-23 | Disposition: A | Discharge status: Home / Self Care | Payer: Medicare HMO | Source: Ambulatory Visit | Attending: Cardiovascular Disease | Admitting: Cardiovascular Disease

## 2020-12-23 DIAGNOSIS — R0602 Shortness of breath: Secondary | ICD-10-CM | POA: Diagnosis not present

## 2020-12-23 DIAGNOSIS — E785 Hyperlipidemia, unspecified: Secondary | ICD-10-CM | POA: Diagnosis not present

## 2020-12-23 LAB — EXERCISE TOLERANCE TEST
Estimated workload: 11
Exercise duration (min): 9 min
Exercise duration (sec): 38 s
MPHR: 152 {beats}/min
Peak HR: 157 {beats}/min
Percent HR: 103 %
Rest HR: 76 {beats}/min

## 2020-12-24 ENCOUNTER — Other Ambulatory Visit: Payer: Self-pay | Admitting: Nurse Practitioner

## 2020-12-24 MED ORDER — CLOPIDOGREL BISULFATE 75 MG PO TABS: 75.0000 mg | ORAL_TABLET | Freq: Every day | ORAL | 6 refills | Status: DC

## 2020-12-27 NOTE — Progress Notes (Deleted)
° ° °  Dakota Branch T. Dakota Walder, MD, [place] at [facility] [place], [address]  Phone: [phone]   FAX: [phone]  Dakota Branch - 70 y.o. male   MRN [id number]   Date of Birth: 01-25-1950  Date: 12/28/2020   PCP: Venia Carbon, MD   Referral: Venia Carbon, MD  No chief complaint on file.   This visit occurred during the SARS-CoV-2 public health emergency.  Safety protocols were in place, including screening questions prior to the visit, additional usage of staff PPE, and extensive cleaning of exam room while observing appropriate contact time as indicated for disinfecting solutions.   Subjective:   Dakota Branch is a 69 y.o. very pleasant male patient with There is no height or weight on file to calculate BMI. who presents with the following:  The patient is seen in consultation today for my partner Dr. Drusilla Kanner.  He is here for an assessment of ongoing finger issues.  In the past, looks as if he has had a trigger finger as well as some basal joint arthritis.    Review of Systems is noted in the HPI, as appropriate  Objective:   There were no vitals taken for this visit.  GEN: No acute distress; alert,appropriate. PULM: Breathing comfortably in no respiratory distress PSYCH: Normally interactive.   Laboratory and Imaging Data:  Assessment and Plan:   ***

## 2020-12-28 ENCOUNTER — Ambulatory Visit: Payer: Medicare HMO | Admitting: Family

## 2020-12-28 ENCOUNTER — Ambulatory Visit: Payer: Medicare HMO | Admitting: Family Medicine

## 2020-12-28 ENCOUNTER — Other Ambulatory Visit: Payer: Self-pay

## 2020-12-28 ENCOUNTER — Telehealth: Payer: Self-pay | Admitting: Cardiology

## 2020-12-28 DIAGNOSIS — E785 Hyperlipidemia, unspecified: Secondary | ICD-10-CM

## 2020-12-28 DIAGNOSIS — I10 Essential (primary) hypertension: Secondary | ICD-10-CM

## 2020-12-28 NOTE — Telephone Encounter (Signed)
Received message from answering service that patient would like to schedule his heart procedure.

## 2020-12-28 NOTE — Telephone Encounter (Signed)
Left message to call back.    Minus Breeding, MD  12/24/2020  5:02 PM EST     I spoke with the patient.  Given this result and his elevated coronary calcium cardiac cath is indicated.  The patient understands that risks included but are not limited to stroke (1 in 1000), death (1 in 18), kidney failure [usually temporary] (1 in 500), bleeding (1 in 200), allergic reaction [possibly serious] (1 in 200).  The patient understands and agrees to proceed.   I offered to arrange this test for next week but he would like to wait until the week after because his family is out of town.   We will need to call him to arrange.  I will ask him to start taking and ASA.  He is not having any unstable symptoms.     Minus Breeding, MD  12/24/2020  5:06 PM EST     He reports an allergy to ASA so we will call in a prescription for Plavix.

## 2020-12-29 ENCOUNTER — Other Ambulatory Visit: Payer: Self-pay

## 2020-12-29 DIAGNOSIS — I700 Atherosclerosis of aorta: Secondary | ICD-10-CM

## 2020-12-29 MED ORDER — ASPIRIN EC 81 MG PO TBEC: 81.0000 mg | DELAYED_RELEASE_TABLET | Freq: Every day | ORAL | 3 refills | Status: AC

## 2020-12-29 NOTE — Telephone Encounter (Signed)
Patient would like for you to call him back regarding his cardiac cath. He was unable to give you his list of medications yesterday because he was driving.

## 2020-12-29 NOTE — Telephone Encounter (Signed)
Patient following up on call from yesterday. Please call back

## 2020-12-29 NOTE — Telephone Encounter (Signed)
Spoke with patient and let him know not to take Plavix. He stated he never took any. I gave him the new order for Asprin 81 mg daily and sent it to his pharmacy. We reviewed the guidelines for his cardiac cath; that and the lab forms will be mailed out to him today.

## 2021-01-04 ENCOUNTER — Telehealth: Payer: Self-pay | Admitting: Cardiology

## 2021-01-04 DIAGNOSIS — I10 Essential (primary) hypertension: Secondary | ICD-10-CM | POA: Diagnosis not present

## 2021-01-04 DIAGNOSIS — E785 Hyperlipidemia, unspecified: Secondary | ICD-10-CM | POA: Diagnosis not present

## 2021-01-04 NOTE — Telephone Encounter (Signed)
Spoke to patient he will have pre cath labs this afternoon.Stated the hospital called him today to review his medications and he forgot to tell her he takes a potassium supplement.Stated he wants to speak to Production assistant, radio.Advised I will send message to her.

## 2021-01-04 NOTE — Telephone Encounter (Signed)
Spoke wit patient and let him know that the procedure personnel will contact him about his medications and that potassium was ok to take. He is coming to [facility] for fasting blood work today.

## 2021-01-04 NOTE — Telephone Encounter (Signed)
° °  Pt requesting to speak with Judson Roch, regarding his upcoming procedure, he wanted to know if he needs to have his labs done for his procedure or for his medications. And also he said he forgot to mention to pre admission he is taking potassium, he asked to get a cb asap since he had not eaten yet, he wanted to know if he needs to do the lab work today

## 2021-01-05 ENCOUNTER — Telehealth: Payer: Self-pay | Admitting: *Deleted

## 2021-01-05 LAB — BASIC METABOLIC PANEL
BUN/Creatinine Ratio: 15 (ref 10–24)
BUN: 14 mg/dL (ref 8–27)
CO2: 29 mmol/L (ref 20–29)
Calcium: 9.3 mg/dL (ref 8.6–10.2)
Chloride: 97 mmol/L (ref 96–106)
Creatinine, Ser: 0.91 mg/dL (ref 0.76–1.27)
Glucose: 93 mg/dL (ref 70–99)
Potassium: 3.7 mmol/L (ref 3.5–5.2)
Sodium: 142 mmol/L (ref 134–144)
eGFR: 92 mL/min/{1.73_m2} (ref >59)

## 2021-01-05 LAB — CBC
Hematocrit: 41.9 % (ref 37.5–51.0)
Hemoglobin: 14.9 g/dL (ref 13.0–17.7)
MCH: 30.5 pg (ref 26.6–33.0)
MCHC: 35.6 g/dL (ref 31.5–35.7)
MCV: 86 fL (ref 79–97)
Platelets: 260 10*3/uL (ref 150–450)
RBC: 4.88 x10E6/uL (ref 4.14–5.80)
RDW: 11.8 % (ref 11.6–15.4)
WBC: 7.1 10*3/uL (ref 3.4–10.8)

## 2021-01-05 NOTE — Telephone Encounter (Signed)
Cardiac catheterization scheduled at [facility] for: Thursday January 06, 2021 [facility] Main Entrance [facility]) at: 10 AM   Diet-no solid food after midnight prior to cath, clear liquids until 5 AM day of procedure.  Medication instructions for procedure: -Hold:  Chlorthalidone-AM of procedure -Except hold medications usual morning medications can be taken pre-cath with sips of water including aspirin 81 mg.    Confirmed patient has responsible adult to drive home post procedure and be with patient first 24 hours after arriving home.  [facility] does allow one visitor to accompany you and wait in the hospital waiting room while you are there for your procedure. You and your visitor will be asked to wear a mask once you enter the hospital.   Patient reports does not currently have any new symptoms concerning for COVID-19 and no household members with COVID-19 like illness.        Reviewed procedure/mask/visitor instructions with patient.

## 2021-01-05 NOTE — Progress Notes (Addendum)
Precath positive CAC score in high risk range > 300 with abnormal ETT abnormal at 6 minutes but completed 9:38 mins Vague symptoms.

## 2021-01-06 ENCOUNTER — Encounter (HOSPITAL_COMMUNITY)
Admission: RE | Disposition: A | Discharge status: Home / Self Care | Payer: Self-pay | Source: Ambulatory Visit | Attending: Interventional Cardiology

## 2021-01-06 ENCOUNTER — Other Ambulatory Visit: Payer: Self-pay

## 2021-01-06 ENCOUNTER — Ambulatory Visit (HOSPITAL_COMMUNITY)
Admission: RE | Admit: 2021-01-06 | Discharge: 2021-01-06 | Disposition: A | Discharge status: Home / Self Care | Payer: Medicare HMO | Source: Ambulatory Visit | Attending: Interventional Cardiology | Admitting: Interventional Cardiology

## 2021-01-06 DIAGNOSIS — R9439 Abnormal result of other cardiovascular function study: Secondary | ICD-10-CM

## 2021-01-06 DIAGNOSIS — E785 Hyperlipidemia, unspecified: Secondary | ICD-10-CM | POA: Insufficient documentation

## 2021-01-06 DIAGNOSIS — I2510 Atherosclerotic heart disease of native coronary artery without angina pectoris: Secondary | ICD-10-CM | POA: Diagnosis not present

## 2021-01-06 DIAGNOSIS — R0609 Other forms of dyspnea: Secondary | ICD-10-CM | POA: Diagnosis present

## 2021-01-06 DIAGNOSIS — N529 Male erectile dysfunction, unspecified: Secondary | ICD-10-CM | POA: Diagnosis present

## 2021-01-06 DIAGNOSIS — R0602 Shortness of breath: Secondary | ICD-10-CM | POA: Insufficient documentation

## 2021-01-06 DIAGNOSIS — Z79899 Other long term (current) drug therapy: Secondary | ICD-10-CM | POA: Diagnosis not present

## 2021-01-06 DIAGNOSIS — G4733 Obstructive sleep apnea (adult) (pediatric): Secondary | ICD-10-CM | POA: Diagnosis present

## 2021-01-06 DIAGNOSIS — I10 Essential (primary) hypertension: Secondary | ICD-10-CM | POA: Diagnosis not present

## 2021-01-06 DIAGNOSIS — I700 Atherosclerosis of aorta: Secondary | ICD-10-CM | POA: Diagnosis present

## 2021-01-06 HISTORY — PX: LEFT HEART CATH AND CORONARY ANGIOGRAPHY: CATH118249

## 2021-01-06 SURGERY — LEFT HEART CATH AND CORONARY ANGIOGRAPHY
Anesthesia: LOCAL

## 2021-01-06 MED ORDER — VERAPAMIL HCL 2.5 MG/ML IV SOLN
INTRAVENOUS | Status: DC | PRN
  Administered 2021-01-06: 10 mL via INTRA_ARTERIAL

## 2021-01-06 MED ORDER — HEPARIN SODIUM (PORCINE) 1000 UNIT/ML IJ SOLN
INTRAMUSCULAR | Status: DC | PRN
  Administered 2021-01-06: 4000 [IU] via INTRAVENOUS

## 2021-01-06 MED ORDER — SODIUM CHLORIDE 0.9 % WEIGHT BASED INFUSION: 1.0000 mL/kg/h | INTRAVENOUS | Status: DC

## 2021-01-06 MED ORDER — FENTANYL CITRATE (PF) 100 MCG/2ML IJ SOLN
INTRAMUSCULAR | Status: AC
  Filled 2021-01-06: qty 2

## 2021-01-06 MED ORDER — MIDAZOLAM HCL 2 MG/2ML IJ SOLN
INTRAMUSCULAR | Status: AC
  Filled 2021-01-06: qty 2

## 2021-01-06 MED ORDER — HEPARIN (PORCINE) IN NACL 1000-0.9 UT/500ML-% IV SOLN
INTRAVENOUS | Status: DC | PRN
  Administered 2021-01-06 (×2): 500 mL

## 2021-01-06 MED ORDER — SODIUM CHLORIDE 0.9 % IV SOLN: 250.0000 mL | INTRAVENOUS | Status: DC | PRN

## 2021-01-06 MED ORDER — NITROGLYCERIN 1 MG/10 ML FOR IR/CATH LAB
INTRA_ARTERIAL | Status: AC
  Filled 2021-01-06: qty 10

## 2021-01-06 MED ORDER — FENTANYL CITRATE (PF) 100 MCG/2ML IJ SOLN
INTRAMUSCULAR | Status: DC | PRN
  Administered 2021-01-06: 25 ug via INTRAVENOUS

## 2021-01-06 MED ORDER — SODIUM CHLORIDE 0.9% FLUSH: 3.0000 mL | Freq: Two times a day (BID) | INTRAVENOUS | Status: DC

## 2021-01-06 MED ORDER — METOPROLOL SUCCINATE ER 25 MG PO TB24: 25.0000 mg | ORAL_TABLET | Freq: Every day | ORAL | 11 refills | Status: DC

## 2021-01-06 MED ORDER — LIDOCAINE HCL (PF) 1 % IJ SOLN
INTRAMUSCULAR | Status: AC
  Filled 2021-01-06: qty 30

## 2021-01-06 MED ORDER — CLOPIDOGREL BISULFATE 300 MG PO TABS
ORAL_TABLET | ORAL | Status: DC | PRN
  Administered 2021-01-06: 300 mg via ORAL

## 2021-01-06 MED ORDER — ASPIRIN 81 MG PO CHEW: 81.0000 mg | CHEWABLE_TABLET | ORAL | Status: DC

## 2021-01-06 MED ORDER — HEPARIN (PORCINE) IN NACL 1000-0.9 UT/500ML-% IV SOLN
INTRAVENOUS | Status: AC
  Filled 2021-01-06: qty 1000

## 2021-01-06 MED ORDER — HEPARIN SODIUM (PORCINE) 1000 UNIT/ML IJ SOLN
INTRAMUSCULAR | Status: AC
  Filled 2021-01-06: qty 10

## 2021-01-06 MED ORDER — ROSUVASTATIN CALCIUM 40 MG PO TABS: 40.0000 mg | ORAL_TABLET | Freq: Every day | ORAL | 4 refills | Status: DC

## 2021-01-06 MED ORDER — SODIUM CHLORIDE 0.9% FLUSH: 3.0000 mL | INTRAVENOUS | Status: DC | PRN

## 2021-01-06 MED ORDER — CLOPIDOGREL BISULFATE 75 MG PO TABS
75.0000 mg | ORAL_TABLET | Freq: Every day | ORAL | 3 refills | Status: DC
Start: 2021-01-06 — End: 2022-02-16

## 2021-01-06 MED ORDER — CLOPIDOGREL BISULFATE 300 MG PO TABS
ORAL_TABLET | ORAL | Status: AC
  Filled 2021-01-06: qty 1

## 2021-01-06 MED ORDER — NITROGLYCERIN 0.4 MG SL SUBL: 0.4000 mg | SUBLINGUAL_TABLET | SUBLINGUAL | 3 refills | Status: AC | PRN

## 2021-01-06 MED ORDER — MIDAZOLAM HCL 2 MG/2ML IJ SOLN
INTRAMUSCULAR | Status: DC | PRN
  Administered 2021-01-06: 1 mg via INTRAVENOUS

## 2021-01-06 MED ORDER — LIDOCAINE HCL (PF) 1 % IJ SOLN
INTRAMUSCULAR | Status: DC | PRN
  Administered 2021-01-06: 2 mL via INTRADERMAL

## 2021-01-06 MED ORDER — VERAPAMIL HCL 2.5 MG/ML IV SOLN
INTRAVENOUS | Status: AC
  Filled 2021-01-06: qty 2

## 2021-01-06 MED ORDER — SODIUM CHLORIDE 0.9 % WEIGHT BASED INFUSION
3.0000 mL/kg/h | INTRAVENOUS | Status: AC
  Administered 2021-01-06: 3 mL/kg/h via INTRAVENOUS

## 2021-01-06 SURGICAL SUPPLY — 10 items
CATH 5FR JL3.5 JR4 ANG PIG MP (CATHETERS) ×1 IMPLANT
DEVICE RAD COMP TR BAND LRG (VASCULAR PRODUCTS) ×1 IMPLANT
GLIDESHEATH SLEND A-KIT 6F 22G (SHEATH) ×1 IMPLANT
GUIDEWIRE INQWIRE 1.5J.035X260 (WIRE) IMPLANT
INQWIRE 1.5J .035X260CM (WIRE) ×2
KIT HEART LEFT (KITS) ×2 IMPLANT
PACK CARDIAC CATHETERIZATION (CUSTOM PROCEDURE TRAY) ×2 IMPLANT
SHEATH PROBE COVER 6X72 (BAG) ×1 IMPLANT
TRANSDUCER W/STOPCOCK (MISCELLANEOUS) ×2 IMPLANT
TUBING CIL FLEX 10 FLL-RA (TUBING) ×2 IMPLANT

## 2021-01-06 NOTE — Interval H&P Note (Signed)
Cath Lab Visit (complete for each Cath Lab visit)  Clinical Evaluation Leading to the Procedure:   ACS: No.  Non-ACS:    Anginal Classification: CCS III  Anti-ischemic medical therapy: No Therapy  Non-Invasive Test Results: Intermediate-risk stress test findings: cardiac mortality 1-3%/year  Prior CABG: No previous CABG      History and Physical Interval Note:  01/06/2021 1:50 PM  Dakota Branch  has presented today for surgery, with the diagnosis of ELEVATED CALCIUM SCORE.  The various methods of treatment have been discussed with the patient and family. After consideration of risks, benefits and other options for treatment, the patient has consented to  Procedure(s): LEFT HEART CATH AND CORONARY ANGIOGRAPHY (N/A) as a surgical intervention.  The patient's history has been reviewed, patient examined, no change in status, stable for surgery.  I have reviewed the patient's chart and labs.  Questions were answered to the patient's satisfaction.     Belva Crome III

## 2021-01-06 NOTE — CV Procedure (Signed)
Eccentric 80 to 85% mid LAD.  First diagonal contains lengthy ostial proximal stenosis mid to distal LAD after the second diagonal contains 95% obstruction with TIMI-3 flow.  The diagonal contains 90% stenosis in the proximal to mid segment. Circumflex contains a distal trifurcation with 50 to 70% stenosis in each of 3 branches.  The mid circumflex contains 40% stenosis. Right coronary is codominant and small Left main is widely patent LV function is normal. EF is 60%.

## 2021-01-07 ENCOUNTER — Telehealth: Payer: Self-pay | Admitting: Cardiology

## 2021-01-07 ENCOUNTER — Encounter: Payer: Self-pay | Admitting: Cardiology

## 2021-01-07 ENCOUNTER — Encounter (HOSPITAL_COMMUNITY): Payer: Self-pay | Admitting: Interventional Cardiology

## 2021-01-07 MED FILL — Nitroglycerin IV Soln 100 MCG/ML in D5W: INTRA_ARTERIAL | Qty: 10 | Status: AC

## 2021-01-07 NOTE — Telephone Encounter (Signed)
°  Patient needs to discuss the new medications he was put on since his heart cath and he has some questions because he does not remember all that he was told after the procedure.

## 2021-01-07 NOTE — Telephone Encounter (Signed)
Follow Up:   Did not need this encounter, patient wanted an appointment

## 2021-01-07 NOTE — Telephone Encounter (Signed)
Spoke with patient of Dr. Percival Spanish who had heart cath on 01/06/21 with Dr. Tamala Julian  He is tentatively scheduled for PCI on 01/13/21 but wants to talk with Dr. Tamala Julian about yesterday's procedure, etc before proceeding  I reached out to Dr. Thompson Caul nurse about if he could contact patient or see him (opening on 1/10) to discuss as patient is concerned and wants to understand better, but this was not an option at this time, was advised that patient needs to discuss with Dr. Percival Spanish  I explained this to patient who was very frustrated that he cannot talk to a doctor about his procedure, recommendations, etc. He is frustrated that a scheduler made him an appt on 1/23 AFTER his tentative 1/12 cath and therefore he would not see another MD before his procedure.   Advised will speak with Dr. Percival Spanish to see about calling him to discuss   Per cath note:  RECOMMENDATIONS:   Based upon the ISCHEMIA TRIAL, in this patient who is asymptomatic and despite ischemia demonstrated on exercise treadmill testing, it would be appropriate to start anti-ischemic and preventive therapy and base further therapy upon the presence or absence of symptoms. With this approach in mind, I have recommended dual antiplatelet therapy; beta-blocker therapy; and will discuss fidelity with this approach with his primary cardiologist. If after conversation with the patient and primary cardiologist (Dr. Percival Spanish) if it is felt the revascularization is needed, he is tentatively scheduled for LAD PCI in 7 days.

## 2021-01-07 NOTE — Telephone Encounter (Signed)
I had a long conversation with the patient today about his cath results.  We discussed the ISCHEMIA trial and then medical management versus PCI.  My suggestion would be that, given the degree of stenosis, size of this vessel percutaneous revascularization would be very reasonable and then involving standard very good chance of getting an excellent result.  We had a long discussion about medical management.  The need for aggressive risk reduction and optimal medical therapy.  I have suggested proceeding with angioplasty but the patient wants to have a second implant in changes with this and probably.  Presenting urgently if he has any new symptoms going forward and we discussed these.  We will continue the meds as listed after the catheterization we will cancel his planned revascularization.  He does have an appointment with me later this month.  He thanked me for the phone call and a long discussion.Dakota Branch

## 2021-01-10 ENCOUNTER — Telehealth: Payer: Self-pay | Admitting: Cardiology

## 2021-01-10 NOTE — Telephone Encounter (Signed)
Patient calling in to see what time his procedure is on Thursday as well as him having question/concerns. Please advise

## 2021-01-10 NOTE — Telephone Encounter (Addendum)
I spoke with patient and this is his plan: -Patient wants to be placed on schedule for Coronary Stent Intervention 01/13/21 with Dr Tamala Julian. -Patient reports he is getting a second opinion tomorrow morning and if he does not want to proceed with he will call our office tomorrow after appointment for second opinion.     Per Dr Ardis Rowan to schedule Coronary Stent 01/13/21. Patient is aware that Coronary Stent has been scheduled 01/13/21 with Dr Tomma Rakers 8:30 AM for 10:30 AM procedure. Instructions below reviewed with patient:   Coronary Stent Intervention scheduled at [facility] for: Thursday January 13, 2021 10:30 AM [facility] Main Entrance [facility]) at: 8:30 AM   Diet-no solid food after midnight prior to cath, clear liquids until 5 AM day of procedure.  Medication instructions for procedure: -Hold:  Chlorthalidone-AM of procedure -Except hold medications usual morning medications can be taken pre-cath with sips of water including aspirin 81 mg and Plavix 75 mg.     Confirmed patient has responsible adult to drive home post procedure and be with patient first 24 hours after arriving home.  [facility] does allow one visitor to accompany you and wait in the hospital waiting room while you are there for your procedure. You and your visitor will be asked to wear a mask once you enter the hospital.   Patient reports does not currently have any new symptoms concerning for COVID-19 and no household members with COVID-19 like illness.    Reviewed procedure/mask/visitor instructions with patient.

## 2021-01-10 NOTE — Telephone Encounter (Addendum)
Coronary Stent Intervention 01/13/21 cancelled earlier today by Dr Tamala Julian.  Copied from 01/10/21 staff message per Dr Tamala Julian: I was responding to Dr. Percival Spanish who spoke with the patient on Friday evening and notified me that the patient wanted his data and was going to go to [facility] to be seen by a relatives cardiologist for second opinion.  If you reach out to the patient and find that this is different, please let me know.  If he is not going for second opinion let me know, and put him back on the schedule.  _______________  Call to patient to discuss his plans for proceeding with Coronary Stent Intervention by Dr Tamala Julian at [facility] or if he plans to seek second opinion first. I did not get answer-left message on patient's voicemail to call me back to discuss his plans.

## 2021-01-11 ENCOUNTER — Telehealth: Payer: Self-pay | Admitting: *Deleted

## 2021-01-11 DIAGNOSIS — I25118 Atherosclerotic heart disease of native coronary artery with other forms of angina pectoris: Secondary | ICD-10-CM | POA: Diagnosis not present

## 2021-01-11 DIAGNOSIS — I10 Essential (primary) hypertension: Secondary | ICD-10-CM | POA: Diagnosis not present

## 2021-01-11 NOTE — Telephone Encounter (Signed)
Patient called insisting Dr Tamala Julian call him today to discuss Coronary Stent scheduled for 01/13/21. Patient acknowledges that he had conversation with Dr Percival Spanish about this end of last week,  but requests call from Dr Tamala Julian.  Patient advised I will send message to Dr Tamala Julian with request to call patient today.

## 2021-01-11 NOTE — Telephone Encounter (Signed)
See phone note 01/10/21 -patient has been called and this has been discussed with patient.

## 2021-01-11 NOTE — Telephone Encounter (Signed)
Left message for patient to return the call.

## 2021-01-11 NOTE — Telephone Encounter (Signed)
Pt of Dr. Percival Spanish.  Pt called in previously to speak with Dr. Tamala Julian and [facility] nurse made him aware that Dr. Tamala Julian said he needed to discuss PCI matters/decisions with Dr. Percival Spanish.  Will route to [facility] triage team to address.

## 2021-01-12 ENCOUNTER — Telehealth: Payer: Self-pay | Admitting: Interventional Cardiology

## 2021-01-12 NOTE — Telephone Encounter (Signed)
Patient called and would like to cancel their cathlab scheduled for tomorrow at 10:30 with Dr. Tamala Julian

## 2021-01-12 NOTE — Telephone Encounter (Signed)
Spoke with Dakota Branch who states he wishes to cancel his procedure with Dr Tamala Julian scheduled for 01/13/2021.  Dakota Branch states he does not wish to reschedule appointment at this time and offers no explanation for canceling.   Dakota Branch advised will forward information to make Dr Tamala Julian and his RN aware but neither are in the office today.  Will also forward to nurse navigator to make her aware as well.  Dakota Branch verbalizes understanding and agrees with current plan.

## 2021-01-13 ENCOUNTER — Ambulatory Visit (HOSPITAL_COMMUNITY)
Admission: RE | Admit: 2021-01-13 | Payer: Medicare HMO | Source: Home / Self Care | Admitting: Interventional Cardiology

## 2021-01-13 SURGERY — CORONARY STENT INTERVENTION
Anesthesia: LOCAL

## 2021-01-13 NOTE — Progress Notes (Deleted)
Opened in error. This note will not go away.

## 2021-01-13 NOTE — Progress Notes (Deleted)
Entered in error

## 2021-01-13 NOTE — Progress Notes (Deleted)
Opened in error

## 2021-01-21 DIAGNOSIS — Z1283 Encounter for screening for malignant neoplasm of skin: Secondary | ICD-10-CM | POA: Diagnosis not present

## 2021-01-21 DIAGNOSIS — D225 Melanocytic nevi of trunk: Secondary | ICD-10-CM | POA: Diagnosis not present

## 2021-01-21 DIAGNOSIS — D485 Neoplasm of uncertain behavior of skin: Secondary | ICD-10-CM | POA: Diagnosis not present

## 2021-01-24 ENCOUNTER — Ambulatory Visit: Payer: Medicare HMO | Admitting: Cardiology

## 2021-01-26 ENCOUNTER — Ambulatory Visit: Payer: Medicare HMO | Admitting: Cardiology

## 2021-01-31 DIAGNOSIS — I4510 Unspecified right bundle-branch block: Secondary | ICD-10-CM | POA: Diagnosis not present

## 2021-01-31 DIAGNOSIS — R001 Bradycardia, unspecified: Secondary | ICD-10-CM | POA: Diagnosis not present

## 2021-01-31 DIAGNOSIS — R0602 Shortness of breath: Secondary | ICD-10-CM | POA: Diagnosis not present

## 2021-01-31 DIAGNOSIS — I2584 Coronary atherosclerosis due to calcified coronary lesion: Secondary | ICD-10-CM | POA: Diagnosis not present

## 2021-01-31 DIAGNOSIS — I25118 Atherosclerotic heart disease of native coronary artery with other forms of angina pectoris: Secondary | ICD-10-CM | POA: Diagnosis not present

## 2021-01-31 DIAGNOSIS — I10 Essential (primary) hypertension: Secondary | ICD-10-CM | POA: Diagnosis not present

## 2021-02-03 ENCOUNTER — Telehealth (HOSPITAL_COMMUNITY): Payer: Self-pay | Admitting: Internal Medicine

## 2021-02-04 DIAGNOSIS — L988 Other specified disorders of the skin and subcutaneous tissue: Secondary | ICD-10-CM | POA: Diagnosis not present

## 2021-02-04 DIAGNOSIS — D485 Neoplasm of uncertain behavior of skin: Secondary | ICD-10-CM | POA: Diagnosis not present

## 2021-02-07 DIAGNOSIS — I2510 Atherosclerotic heart disease of native coronary artery without angina pectoris: Secondary | ICD-10-CM | POA: Diagnosis not present

## 2021-02-07 DIAGNOSIS — R590 Localized enlarged lymph nodes: Secondary | ICD-10-CM | POA: Diagnosis not present

## 2021-02-07 DIAGNOSIS — Z955 Presence of coronary angioplasty implant and graft: Secondary | ICD-10-CM | POA: Diagnosis not present

## 2021-02-07 DIAGNOSIS — D649 Anemia, unspecified: Secondary | ICD-10-CM | POA: Diagnosis not present

## 2021-02-07 DIAGNOSIS — I10 Essential (primary) hypertension: Secondary | ICD-10-CM | POA: Diagnosis not present

## 2021-02-07 DIAGNOSIS — R1909 Other intra-abdominal and pelvic swelling, mass and lump: Secondary | ICD-10-CM | POA: Diagnosis not present

## 2021-02-07 DIAGNOSIS — C439 Malignant melanoma of skin, unspecified: Secondary | ICD-10-CM | POA: Diagnosis not present

## 2021-02-07 DIAGNOSIS — E782 Mixed hyperlipidemia: Secondary | ICD-10-CM | POA: Diagnosis not present

## 2021-02-10 ENCOUNTER — Encounter (HOSPITAL_COMMUNITY): Payer: Self-pay | Admitting: *Deleted

## 2021-02-10 NOTE — Progress Notes (Signed)
Received referral notification from Dr. Lesia Hausen at [facility] for this pt to participate in Cardiac Rehab s/p 01/31/21 DES to LAD.  Reviewed medical history in [facility] and [facility]where.  Pt has upcoming post hospitalization follow up on 2/23.  Sent to Dr. Laurance Flatten referral form to complete/sign and return.  Also requested 12 lead ekg tracing.  Once pt has satisfactorily completed follow up and requested documents returned, pt can be scheduled. Cherre Huger, BSN Cardiac and Training and development officer

## 2021-02-11 DIAGNOSIS — R590 Localized enlarged lymph nodes: Secondary | ICD-10-CM | POA: Diagnosis not present

## 2021-02-24 DIAGNOSIS — I2510 Atherosclerotic heart disease of native coronary artery without angina pectoris: Secondary | ICD-10-CM | POA: Diagnosis not present

## 2021-02-24 DIAGNOSIS — I10 Essential (primary) hypertension: Secondary | ICD-10-CM | POA: Diagnosis not present

## 2021-02-28 ENCOUNTER — Telehealth (HOSPITAL_COMMUNITY): Payer: Self-pay | Admitting: *Deleted

## 2021-02-28 ENCOUNTER — Telehealth (HOSPITAL_COMMUNITY): Payer: Self-pay | Admitting: Internal Medicine

## 2021-02-28 NOTE — Telephone Encounter (Signed)
Message  left on departmental voicemail for scheduling cardiac rehab.  Called and spoke to pt who acknowledge that he did one session at [facility] but felt it was too far to travel on a regular basis.  Pt would like to participate here at [place].  Reviewed general program details.  Pt aware there is a wait list.  Reviewed exercise guidelines he can do during this interim.  Pt verbalized understanding. Cherre Huger, BSN Cardiac and Training and development officer

## 2021-03-14 ENCOUNTER — Other Ambulatory Visit (HOSPITAL_BASED_OUTPATIENT_CLINIC_OR_DEPARTMENT_OTHER): Payer: Self-pay

## 2021-03-14 MED ORDER — ZOSTER VAC RECOMB ADJUVANTED 50 MCG/0.5ML IM SUSR: INTRAMUSCULAR | 1 refills | Status: DC

## 2021-03-24 ENCOUNTER — Encounter (HOSPITAL_COMMUNITY): Payer: Self-pay

## 2021-03-24 ENCOUNTER — Telehealth (HOSPITAL_COMMUNITY): Payer: Self-pay

## 2021-03-24 ENCOUNTER — Telehealth: Payer: Self-pay

## 2021-03-24 NOTE — Telephone Encounter (Signed)
Sent PA though Cover My Meds  for Sildenafil  ?

## 2021-03-24 NOTE — Telephone Encounter (Signed)
Pt insurance is active and benefits verified through [facility]. Co-pay $40.00, DED $0.00/$0.00 met, out of pocket $4,500.00/$114 met, co-insurance 0%. No pre-authorization required. Passport, 03/24/21 @ 2:50PM, SWF#[id number] ?  ?Will contact patient to see if he is interested in the Cardiac Rehab Program. ?

## 2021-03-24 NOTE — Telephone Encounter (Signed)
Attempted to call patient in regards to Cardiac Rehab - LM on VM Mailed letter 

## 2021-04-05 ENCOUNTER — Telehealth (HOSPITAL_COMMUNITY): Payer: Self-pay

## 2021-04-05 NOTE — Telephone Encounter (Signed)
Returned pt phone call in regards to CR, LMTCB.  

## 2021-04-06 NOTE — Telephone Encounter (Signed)
Pt returned CR phone call and stated he is interested in CR. Patient will come in for orientation on 04/26/21 @ 10AM and will attend the 10:30AM exercise class. Went over insurance, patient verbalized understanding.  ?  ?Tourist information centre manager.  ?

## 2021-04-11 ENCOUNTER — Other Ambulatory Visit: Payer: Self-pay | Admitting: Internal Medicine

## 2021-04-25 ENCOUNTER — Telehealth (HOSPITAL_COMMUNITY): Payer: Self-pay | Admitting: *Deleted

## 2021-04-25 NOTE — Telephone Encounter (Signed)
Spoke with patient. Confirmed appointment. Completed health history.Harrell Gave RN BSN  ?

## 2021-04-26 ENCOUNTER — Encounter (HOSPITAL_COMMUNITY)
Admission: RE | Admit: 2021-04-26 | Discharge: 2021-04-26 | Disposition: A | Discharge status: Home / Self Care | Payer: Medicare HMO | Source: Ambulatory Visit | Attending: Cardiology | Admitting: Cardiology

## 2021-04-26 ENCOUNTER — Encounter (HOSPITAL_COMMUNITY): Payer: Self-pay

## 2021-04-26 VITALS — BP 122/60 | HR 64 | Ht 69.25 in | Wt 187.4 lb

## 2021-04-26 DIAGNOSIS — Z955 Presence of coronary angioplasty implant and graft: Secondary | ICD-10-CM | POA: Insufficient documentation

## 2021-04-26 HISTORY — DX: Atherosclerotic heart disease of native coronary artery without angina pectoris: I25.10

## 2021-04-26 NOTE — Progress Notes (Signed)
Cardiac Individual Treatment Plan ? ?Patient Details  ?Name: Dakota Branch ?MRN: [id number] ?Date of Birth: [birth year] ?Referring Provider:   ?Flowsheet Row CARDIAC REHAB PHASE II ORIENTATION from 04/26/2021 in [place]  ?Referring Provider Edwin Dada, MD ([facility])  Radford Pax, Eber Hong, MD (coverage)]  ? ?  ? ? ?Initial Encounter Date:  ?Flowsheet Row CARDIAC REHAB PHASE II ORIENTATION from 04/26/2021 in [place]  ?Date 04/26/21  ? ?  ? ? ?Visit Diagnosis: 01/31/21 DES LAD ? ?Patient's Home Medications on Admission: ? ?Current Outpatient Medications:  ?  amLODipine (NORVASC) 5 MG tablet, TAKE 1 TABLET BY MOUTH EVERY DAY, Disp: 90 tablet, Rfl: 2 ?  aspirin EC 81 MG tablet, Take 1 tablet (81 mg total) by mouth daily. Swallow whole., Disp: 90 tablet, Rfl: 3 ?  clopidogrel (PLAVIX) 75 MG tablet, Take 1 tablet (75 mg total) by mouth daily., Disp: 90 tablet, Rfl: 3 ?  Iron Combinations (IRON COMPLEX PO), Take 1 capsule by mouth daily., Disp: , Rfl:  ?  MAGNESIUM PO, Take 1 capsule by mouth daily. magnesium ashwagandha, Disp: , Rfl:  ?  Melatonin 12 MG TABS, Take 12 mg by mouth at bedtime as needed (sleep)., Disp: , Rfl:  ?  metoprolol succinate (TOPROL XL) 25 MG 24 hr tablet, Take 1 tablet (25 mg total) by mouth daily., Disp: 30 tablet, Rfl: 11 ?  Multiple Vitamins-Minerals (MULTIVITAMIN WITH MINERALS) tablet, Take 1 tablet by mouth daily. 55+, Disp: , Rfl:  ?  nitroGLYCERIN (NITROSTAT) 0.4 MG SL tablet, Place 1 tablet (0.4 mg total) under the tongue every 5 (five) minutes as needed for chest pain., Disp: 100 tablet, Rfl: 3 ?  Omega-3 Fatty Acids ([place] WILD FISH OIL PO), Take 1,000 mg by mouth every other day., Disp: , Rfl:  ?  Omega-3 Fatty Acids (OMEGA-3 FISH OIL PO), Take 1 capsule by mouth every other day., Disp: , Rfl:  ?  rosuvastatin (CRESTOR) 40 MG tablet, Take 1 tablet (40 mg total) by mouth daily., Disp: 90 tablet, Rfl: 4 ?  sildenafil (REVATIO) 20  MG tablet, Take 10 mg by mouth daily as needed (ED)., Disp: , Rfl:  ?  triamcinolone cream (KENALOG) 0.1 %, Apply 1 application topically 2 (two) times daily as needed., Disp: 45 g, Rfl: 1 ?  TURMERIC CURCUMIN PO, Take 1 tablet by mouth daily., Disp: , Rfl:  ?  chlorthalidone (HYGROTON) 25 MG tablet, Take 1 tablet (25 mg total) by mouth daily. (Patient not taking: Reported on 04/25/2021), Disp: 90 tablet, Rfl: 3 ?  Zoster Vaccine Adjuvanted [facility]) injection, Inject into the muscle., Disp: 0.5 mL, Rfl: 1 ? ?Past Medical History: ?Past Medical History:  ?Diagnosis Date  ? Colon polyps   ? Coronary artery disease   ? ED (erectile dysfunction)   ? GERD (gastroesophageal reflux disease)   ? Hyperlipidemia   ? Hypertension   ? OSA (obstructive sleep apnea)   ? ? ?Tobacco Use: ?Social History  ? ?Tobacco Use  ?Smoking Status Never  ?Smokeless Tobacco Never  ? ? ?Labs: ?Review Flowsheet   ? ?  ?  Latest Ref Rng & Units 03/04/2015 04/05/2016 04/09/2017 11/07/2019  ?Labs for ITP Cardiac and Pulmonary Rehab  ?Cholestrol 0 - 200 mg/dL 183   190   191   203    ?LDL (calc) 0 - 99 mg/dL 123   136   131   146    ?HDL-C >39.00 mg/dL 30.80  31.60   32.50   38.20    ?Trlycerides 0.0 - 149.0 mg/dL 145.0   112.0   139.0   96.0    ?Hemoglobin A1c 4.6 - 6.5 %   4.7     ? ?  10/08/2020  ?Labs for ITP Cardiac and Pulmonary Rehab  ?Cholestrol 196    ?LDL (calc) 137    ?HDL-C 40.80    ?Trlycerides 94.0    ?Hemoglobin A1c   ?  ? ? Multiple values from one day are sorted in reverse-chronological order  ?  ?  ? ? ?Capillary Blood Glucose: ?No results found for: GLUCAP ? ? ?Exercise Target Goals: ?Exercise Program Goal: ?Individual exercise prescription set using results from initial 6 min walk test and THRR while considering  patient?s activity barriers and safety.  ? ?Exercise Prescription Goal: ?Initial exercise prescription builds to 30-45 minutes a day of aerobic activity, 2-3 days per week.  Home exercise guidelines will be given to patient  during program as part of exercise prescription that the participant will acknowledge. ? ?Activity Barriers & Risk Stratification: ? Activity Barriers & Cardiac Risk Stratification - 04/26/21 1028   ? ?  ? Activity Barriers & Cardiac Risk Stratification  ? Activity Barriers History of Falls;Other (comment)   ? Comments Torn right rotator cuff. Trigger finger, arthritis both hands, effects grip.   ? Cardiac Risk Stratification Low   ? ?  ?  ? ?  ? ? ?6 Minute Walk: ? 6 Minute Walk   ? ? [place] Name 04/26/21 1011  ?  ?  ?  ? 6 Minute Walk  ? Phase Initial    ? Distance 1727 feet    ? Walk Time 6 minutes    ? # of Rest Breaks 0    ? MPH 3.27    ? METS 3.61    ? RPE 9    ? Perceived Dyspnea  0    ? VO2 Peak 12.64    ? Symptoms No    ? Resting HR 64 bpm    ? Resting BP 122/60    ? Resting Oxygen Saturation  98 %    ? Exercise Oxygen Saturation  during 6 min walk 99 %    ? Max Ex. HR 82 bpm    ? Max Ex. BP 130/62    ? 2 Minute Post BP 110/62    ? ?  ?  ? ?  ? ? ?Oxygen Initial Assessment: ? ? ?Oxygen Re-Evaluation: ? ? ?Oxygen Discharge (Final Oxygen Re-Evaluation): ? ? ?Initial Exercise Prescription: ? Initial Exercise Prescription - 04/26/21 1100   ? ?  ? Date of Initial Exercise RX and Referring Provider  ? Date 04/26/21   ? Referring Provider Edwin Dada, MD ([facility])   Sueanne Margarita, MD (coverage)  ? Expected Discharge Date 06/24/21   ?  ? Recumbant Bike  ? Level 2   ? Watts 40   ? Minutes 15   ? METs 3.48   ?  ? NuStep  ? Level 3   ? SPM 85   ? Minutes 15   ? METs 3.2   ?  ? Prescription Details  ? Frequency (times per week) 3   ? Duration Progress to 30 minutes of continuous aerobic without signs/symptoms of physical distress   ?  ? Intensity  ? THRR 40-80% of Max Heartrate 61-122   ? Ratings of Perceived Exertion 11-13   ? Perceived Dyspnea 0-4   ?  ?  Progression  ? Progression Continue to progress workloads to maintain intensity without signs/symptoms of physical distress.   ?  ? Resistance Training  ? Training  Prescription Yes   ? Weight 3 lbs   ? Reps 10-15   ? ?  ?  ? ?  ? ? ?Perform Capillary Blood Glucose checks as needed. ? ?Exercise Prescription Changes: ? ? ?Exercise Comments: ? ? ?Exercise Goals and Review: ? ? Exercise Goals   ? ? [place] Name 04/26/21 0240  ?  ?  ?  ?  ?  ? Exercise Goals  ? Increase Physical Activity Yes      ? Intervention Provide advice, education, support and counseling about physical activity/exercise needs.;Develop an individualized exercise prescription for aerobic and resistive training based on initial evaluation findings, risk stratification, comorbidities and participant's personal goals.      ? Expected Outcomes Short Term: Attend rehab on a regular basis to increase amount of physical activity.;Long Term: Exercising regularly at least 3-5 days a week.;Long Term: Add in home exercise to make exercise part of routine and to increase amount of physical activity.      ? Increase Strength and Stamina Yes      ? Intervention Provide advice, education, support and counseling about physical activity/exercise needs.;Develop an individualized exercise prescription for aerobic and resistive training based on initial evaluation findings, risk stratification, comorbidities and participant's personal goals.      ? Expected Outcomes Short Term: Increase workloads from initial exercise prescription for resistance, speed, and METs.;Short Term: Perform resistance training exercises routinely during rehab and add in resistance training at home;Long Term: Improve cardiorespiratory fitness, muscular endurance and strength as measured by increased METs and functional capacity (6MWT)      ? Able to understand and use rate of perceived exertion (RPE) scale Yes      ? Intervention Provide education and explanation on how to use RPE scale      ? Expected Outcomes Short Term: Able to use RPE daily in rehab to express subjective intensity level;Long Term:  Able to use RPE to guide intensity level when exercising  independently      ? Knowledge and understanding of Target Heart Rate Range (THRR) Yes      ? Intervention Provide education and explanation of THRR including how the numbers were predicted and where they a

## 2021-04-26 NOTE — Progress Notes (Signed)
Cardiac Rehab Medication Review by a nurse ? ?Does the patient  feel that his/her medications are working for him/her?  YES  ? ?Has the patient been experiencing any side effects to the medications prescribed?   NO ? ?Does the patient measure his/her own blood pressure or blood glucose at home?  NO  ? ?Does the patient have any problems obtaining medications due to transportation or finances?   NO ? ?Understanding of regimen: good ?Understanding of indications: good ?Potential of compliance: good ? ? ? ?Nurse comments: Mr Gladson is taking his medications as prescribed and has a good understanding of what his medications are for. ? ? ? ?Harrell Gave RN ?04/26/2021 4:07 PM ?  ?

## 2021-05-02 ENCOUNTER — Encounter (HOSPITAL_COMMUNITY)
Admission: RE | Admit: 2021-05-02 | Discharge: 2021-05-02 | Disposition: A | Discharge status: Home / Self Care | Payer: Medicare HMO | Source: Ambulatory Visit | Attending: Cardiology | Admitting: Cardiology

## 2021-05-02 DIAGNOSIS — Z48812 Encounter for surgical aftercare following surgery on the circulatory system: Secondary | ICD-10-CM | POA: Diagnosis not present

## 2021-05-02 DIAGNOSIS — Z955 Presence of coronary angioplasty implant and graft: Secondary | ICD-10-CM | POA: Diagnosis not present

## 2021-05-02 NOTE — Progress Notes (Signed)
Daily Session Note ? ?Patient Details  ?Name: Dakota Branch ?MRN: [id number] ?Date of Birth: Apr 04, 1950 ?Referring Provider:   ?Flowsheet Row CARDIAC REHAB PHASE II ORIENTATION from 04/26/2021 in [place]  ?Referring Provider Edwin Dada, MD ([facility])  Radford Pax, Eber Hong, MD (coverage)]  ? ?  ? ? ?Encounter Date: 05/02/2021 ? ?Check In: ? Session Check In - 05/02/21 1038   ? ?  ? Check-In  ? Supervising physician immediately available to respond to emergencies [facility] immediately available   ? Physician(s) Dr. Bonner Puna   ? Location [facility]   ? Staff Present Seward Carol, MS, ACSM CEP, Exercise Physiologist;Aiman Sonn, RN, BSN;Jetta Walker BS, ACSM EP-C, Exercise Physiologist;David McDonald [place], MS, ACSM-CEP, CCRP, Exercise Physiologist   ? Virtual Visit No   ? Medication changes reported     No   ? Fall or balance concerns reported    No   ? Tobacco Cessation No Change   ? Warm-up and Cool-down Performed as group-led instruction   ? Resistance Training Performed Yes   ? VAD Patient? No   ? PAD/SET Patient? No   ?  ? Pain Assessment  ? Currently in Pain? No/denies   ? Pain Score 0-No pain   ? Multiple Pain Sites No   ? ?  ?  ? ?  ? ? ?Capillary Blood Glucose: ?No results found for this or any previous visit (from the past 24 hour(s)). ? ? Exercise Prescription Changes - 05/02/21 1045   ? ?  ? Response to Exercise  ? Blood Pressure (Admit) 110/74   ? Blood Pressure (Exercise) 112/58   ? Blood Pressure (Exit) 104/60   ? Heart Rate (Admit) 63 bpm   ? Heart Rate (Exercise) 89 bpm   ? Heart Rate (Exit) 63 bpm   ? Rating of Perceived Exertion (Exercise) 11   ? Symptoms None   ? Comments Off to a good start with exercise.   ? Duration Progress to 30 minutes of  aerobic without signs/symptoms of physical distress   ? Intensity THRR unchanged   ?  ? Progression  ? Progression Continue to progress workloads to maintain intensity without signs/symptoms of physical  distress.   ? Average METs 2.3   ?  ? Resistance Training  ? Training Prescription Yes   ? Weight 3 lbs   ? Reps 10-15   ? Time 10 Minutes   ?  ? Interval Training  ? Interval Training No   ?  ? Recumbant Bike  ? Level 2   ? Minutes 10   ? METs 2.5   ?  ? NuStep  ? Level 3   Late arrival, 1st 5 minutes at level 2.0, last 10 minutes at level 3.0.  ? SPM 75   ? Minutes 15   ? METs 2.2   ? ?  ?  ? ?  ? ? ?Social History  ? ?Tobacco Use  ?Smoking Status Never  ?Smokeless Tobacco Never  ? ? ?Goals Met:  ?Exercise tolerated well ?No report of concerns or symptoms today ?Strength training completed today ? ?Goals Unmet:  ?Not Applicable ? ?Comments: [place] started cardiac rehab today.  Pt tolerated light exercise without difficulty. VSS, telemetry-Sinus rhythm, tall t wave, asymptomatic.  Medication list reconciled. Pt denies barriers to medicaiton compliance.  PSYCHOSOCIAL ASSESSMENT:  PHQ-0. Pt exhibits positive coping skills, hopeful outlook with supportive family. No psychosocial needs identified at this time, no psychosocial interventions necessary.  Pt enjoys boating.   Pt oriented to exercise equipment and routine.    Understanding verbalized. Harrell Gave RN BSN  ? ? ?Dr. Fransico Him is Medical Director for Cardiac Rehab at [facility]. ?

## 2021-05-04 ENCOUNTER — Encounter (HOSPITAL_COMMUNITY)
Admission: RE | Admit: 2021-05-04 | Discharge: 2021-05-04 | Disposition: A | Discharge status: Home / Self Care | Payer: Medicare HMO | Source: Ambulatory Visit | Attending: Cardiology | Admitting: Cardiology

## 2021-05-04 DIAGNOSIS — Z955 Presence of coronary angioplasty implant and graft: Secondary | ICD-10-CM | POA: Diagnosis not present

## 2021-05-04 DIAGNOSIS — Z48812 Encounter for surgical aftercare following surgery on the circulatory system: Secondary | ICD-10-CM | POA: Diagnosis not present

## 2021-05-04 NOTE — Progress Notes (Signed)
Dakota Branch 71 y.o. male ?Nutrition Note ?Dakota Branch is motivated to make lifestyle changes to aid with cardiac rehab. Patient has medical history of HTN, aortic atherosclerosis, GERD, OSA, ED, hyperlipidemia. 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 He lives at home with his wife. He does the majority of the cooking. He has started making many lifestyle changes including reduced fast food/fried foods and cooking more from home. He cooks a wide variety of beans/legumes, vegetables, fish (2-3x/week), and homemade plain yogurt. ? ?Labs: WNL ? ?RMR: 3825  ? ?Nutrition Diagnosis ?Overweight related to excessive energy intake as evidenced by a BMI of 27.4 ?(Improving) Inappropriate intake of saturated fats related excessive consumption of fatty meats and processed foods as evidenced by pt's diet recall  ? ?Nutrition Intervention ?Pt?s individual nutrition plan reviewed with pt. ?Benefits of adopting Heart Healthy diet discussed.  ?Continue client-centered nutrition education by RD, as part of interdisciplinary care. ? ?Monitor/Evaluation: ?Patient reports motivation to make lifestyle changes for adherence to heart healthy diet recommendation. We discussed his current dietary changes including high fiber foods, fruit/vegetable intake, lean proteins, fish intake. Stressed the importance of maintain changes and sustainability of changes. Handouts/notes given. Patient amicable to RD suggestions and verbalizes understanding. Will follow-up as needed.  ? ?10 minutes spent in review of topics related to a heart healthy diet including sodium intake, blood sugar control, weight management, and fiber intake. ? ?Goal(s) ?Pt to identify and limit food sources of saturated fat, trans fat, refined carbohydrates and sodium ?Pt to describe the benefit of including lean protein/plant proteins, fruits, vegetables, whole grains, nuts/seeds, and low-fat dairy products in a heart healthy meal plan. ? ? ?Plan:  ?Will provide  client-centered nutrition education as part of interdisciplinary care ?Monitor and evaluate progress toward nutrition goal with team. ? ? ?Dakota Bar [place], MS, RDN, LDN ? ?

## 2021-05-06 ENCOUNTER — Encounter (HOSPITAL_COMMUNITY)
Admission: RE | Admit: 2021-05-06 | Discharge: 2021-05-06 | Disposition: A | Discharge status: Home / Self Care | Payer: Medicare HMO | Source: Ambulatory Visit | Attending: Cardiology | Admitting: Cardiology

## 2021-05-06 DIAGNOSIS — D2271 Melanocytic nevi of right lower limb, including hip: Secondary | ICD-10-CM | POA: Diagnosis not present

## 2021-05-06 DIAGNOSIS — Z955 Presence of coronary angioplasty implant and graft: Secondary | ICD-10-CM

## 2021-05-06 DIAGNOSIS — L905 Scar conditions and fibrosis of skin: Secondary | ICD-10-CM | POA: Diagnosis not present

## 2021-05-06 DIAGNOSIS — Z48812 Encounter for surgical aftercare following surgery on the circulatory system: Secondary | ICD-10-CM | POA: Diagnosis not present

## 2021-05-06 DIAGNOSIS — D2272 Melanocytic nevi of left lower limb, including hip: Secondary | ICD-10-CM | POA: Diagnosis not present

## 2021-05-06 DIAGNOSIS — D485 Neoplasm of uncertain behavior of skin: Secondary | ICD-10-CM | POA: Diagnosis not present

## 2021-05-09 ENCOUNTER — Encounter (HOSPITAL_COMMUNITY)
Admission: RE | Admit: 2021-05-09 | Discharge: 2021-05-09 | Disposition: A | Discharge status: Home / Self Care | Payer: Medicare HMO | Source: Ambulatory Visit | Attending: Cardiology | Admitting: Cardiology

## 2021-05-09 DIAGNOSIS — Z955 Presence of coronary angioplasty implant and graft: Secondary | ICD-10-CM | POA: Diagnosis not present

## 2021-05-09 DIAGNOSIS — Z48812 Encounter for surgical aftercare following surgery on the circulatory system: Secondary | ICD-10-CM | POA: Diagnosis not present

## 2021-05-09 NOTE — Progress Notes (Signed)
Cardiac Individual Treatment Plan ? ?Patient Details  ?Name: Dakota Branch ?MRN: [id number] ?Date of Birth: 12/11/1950 ?Referring Provider:   ?Flowsheet Row CARDIAC REHAB PHASE II ORIENTATION from 04/26/2021 in [place]  ?Referring Provider Edwin Dada, MD ([facility])  Radford Pax, Eber Hong, MD (coverage)]  ? ?  ? ? ?Initial Encounter Date:  ?Flowsheet Row CARDIAC REHAB PHASE II ORIENTATION from 04/26/2021 in [place]  ?Date 04/26/21  ? ?  ? ? ?Visit Diagnosis: 01/31/21 DES LAD ? ?Patient's Home Medications on Admission: ? ?Current Outpatient Medications:  ?  amLODipine (NORVASC) 5 MG tablet, TAKE 1 TABLET BY MOUTH EVERY DAY, Disp: 90 tablet, Rfl: 2 ?  aspirin EC 81 MG tablet, Take 1 tablet (81 mg total) by mouth daily. Swallow whole., Disp: 90 tablet, Rfl: 3 ?  chlorthalidone (HYGROTON) 25 MG tablet, Take 1 tablet (25 mg total) by mouth daily. (Patient not taking: Reported on 04/25/2021), Disp: 90 tablet, Rfl: 3 ?  clopidogrel (PLAVIX) 75 MG tablet, Take 1 tablet (75 mg total) by mouth daily., Disp: 90 tablet, Rfl: 3 ?  Iron Combinations (IRON COMPLEX PO), Take 1 capsule by mouth daily., Disp: , Rfl:  ?  MAGNESIUM PO, Take 1 capsule by mouth daily. magnesium ashwagandha, Disp: , Rfl:  ?  Melatonin 12 MG TABS, Take 12 mg by mouth at bedtime as needed (sleep)., Disp: , Rfl:  ?  metoprolol succinate (TOPROL XL) 25 MG 24 hr tablet, Take 1 tablet (25 mg total) by mouth daily., Disp: 30 tablet, Rfl: 11 ?  Multiple Vitamins-Minerals (MULTIVITAMIN WITH MINERALS) tablet, Take 1 tablet by mouth daily. 55+, Disp: , Rfl:  ?  nitroGLYCERIN (NITROSTAT) 0.4 MG SL tablet, Place 1 tablet (0.4 mg total) under the tongue every 5 (five) minutes as needed for chest pain., Disp: 100 tablet, Rfl: 3 ?  Omega-3 Fatty Acids ([place] WILD FISH OIL PO), Take 1,000 mg by mouth every other day., Disp: , Rfl:  ?  Omega-3 Fatty Acids (OMEGA-3 FISH OIL PO), Take 1 capsule by mouth every other  day., Disp: , Rfl:  ?  rosuvastatin (CRESTOR) 40 MG tablet, Take 1 tablet (40 mg total) by mouth daily., Disp: 90 tablet, Rfl: 4 ?  sildenafil (REVATIO) 20 MG tablet, Take 10 mg by mouth daily as needed (ED)., Disp: , Rfl:  ?  triamcinolone cream (KENALOG) 0.1 %, Apply 1 application topically 2 (two) times daily as needed., Disp: 45 g, Rfl: 1 ?  TURMERIC CURCUMIN PO, Take 1 tablet by mouth daily., Disp: , Rfl:  ?  Zoster Vaccine Adjuvanted [facility]) injection, Inject into the muscle., Disp: 0.5 mL, Rfl: 1 ? ?Past Medical History: ?Past Medical History:  ?Diagnosis Date  ? Colon polyps   ? Coronary artery disease   ? ED (erectile dysfunction)   ? GERD (gastroesophageal reflux disease)   ? Hyperlipidemia   ? Hypertension   ? OSA (obstructive sleep apnea)   ? ? ?Tobacco Use: ?Social History  ? ?Tobacco Use  ?Smoking Status Never  ?Smokeless Tobacco Never  ? ? ?Labs: ?Review Flowsheet   ? ?  ?  Latest Ref Rng & Units 03/04/2015 04/05/2016 04/09/2017 11/07/2019  ?Labs for ITP Cardiac and Pulmonary Rehab  ?Cholestrol 0 - 200 mg/dL 183   190   191   203    ?LDL (calc) 0 - 99 mg/dL 123   136   131   146    ?HDL-C >39.00 mg/dL 30.80  31.60   32.50   38.20    ?Trlycerides 0.0 - 149.0 mg/dL 145.0   112.0   139.0   96.0    ?Hemoglobin A1c 4.6 - 6.5 %   4.7     ? ?  10/08/2020  ?Labs for ITP Cardiac and Pulmonary Rehab  ?Cholestrol 196    ?LDL (calc) 137    ?HDL-C 40.80    ?Trlycerides 94.0    ?Hemoglobin A1c   ?  ? ? Multiple values from one day are sorted in reverse-chronological order  ?  ?  ? ? ?Capillary Blood Glucose: ?No results found for: GLUCAP ? ? ?Exercise Target Goals: ?Exercise Program Goal: ?Individual exercise prescription set using results from initial 6 min walk test and THRR while considering  patient?s activity barriers and safety.  ? ?Exercise Prescription Goal: ?Initial exercise prescription builds to 30-45 minutes a day of aerobic activity, 2-3 days per week.  Home exercise guidelines will be given to patient  during program as part of exercise prescription that the participant will acknowledge. ? ?Activity Barriers & Risk Stratification: ? Activity Barriers & Cardiac Risk Stratification - 04/26/21 1028   ? ?  ? Activity Barriers & Cardiac Risk Stratification  ? Activity Barriers History of Falls;Other (comment)   ? Comments Torn right rotator cuff. Trigger finger, arthritis both hands, effects grip.   ? Cardiac Risk Stratification Low   ? ?  ?  ? ?  ? ? ?6 Minute Walk: ? 6 Minute Walk   ? ? [place] Name 04/26/21 1011  ?  ?  ?  ? 6 Minute Walk  ? Phase Initial    ? Distance 1727 feet    ? Walk Time 6 minutes    ? # of Rest Breaks 0    ? MPH 3.27    ? METS 3.61    ? RPE 9    ? Perceived Dyspnea  0    ? VO2 Peak 12.64    ? Symptoms No    ? Resting HR 64 bpm    ? Resting BP 122/60    ? Resting Oxygen Saturation  98 %    ? Exercise Oxygen Saturation  during 6 min walk 99 %    ? Max Ex. HR 82 bpm    ? Max Ex. BP 130/62    ? 2 Minute Post BP 110/62    ? ?  ?  ? ?  ? ? ?Oxygen Initial Assessment: ? ? ?Oxygen Re-Evaluation: ? ? ?Oxygen Discharge (Final Oxygen Re-Evaluation): ? ? ?Initial Exercise Prescription: ? Initial Exercise Prescription - 04/26/21 1100   ? ?  ? Date of Initial Exercise RX and Referring Provider  ? Date 04/26/21   ? Referring Provider Edwin Dada, MD ([facility])   Sueanne Margarita, MD (coverage)  ? Expected Discharge Date 06/24/21   ?  ? Recumbant Bike  ? Level 2   ? Watts 40   ? Minutes 15   ? METs 3.48   ?  ? NuStep  ? Level 3   ? SPM 85   ? Minutes 15   ? METs 3.2   ?  ? Prescription Details  ? Frequency (times per week) 3   ? Duration Progress to 30 minutes of continuous aerobic without signs/symptoms of physical distress   ?  ? Intensity  ? THRR 40-80% of Max Heartrate 61-122   ? Ratings of Perceived Exertion 11-13   ? Perceived Dyspnea 0-4   ?  ?  Progression  ? Progression Continue to progress workloads to maintain intensity without signs/symptoms of physical distress.   ?  ? Resistance Training  ? Training  Prescription Yes   ? Weight 3 lbs   ? Reps 10-15   ? ?  ?  ? ?  ? ? ?Perform Capillary Blood Glucose checks as needed. ? ?Exercise Prescription Changes: ? ? Exercise Prescription Changes   ? ? [place] Name 05/02/21 1045 05/09/21 1022  ?  ?  ?  ?  ? Response to Exercise  ? Blood Pressure (Admit) 110/74 122/58     ? Blood Pressure (Exercise) 112/58 106/62     ? Blood Pressure (Exit) 104/60 110/60     ? Heart Rate (Admit) 63 bpm 71 bpm     ? Heart Rate (Exercise) 89 bpm 99 bpm     ? Heart Rate (Exit) 63 bpm 65 bpm     ? Rating of Perceived Exertion (Exercise) 11 11     ? Symptoms None None     ? Comments Off to a good start with exercise. Increased WL on RB.     ? Duration Progress to 30 minutes of  aerobic without signs/symptoms of physical distress Progress to 30 minutes of  aerobic without signs/symptoms of physical distress     ? Intensity THRR unchanged THRR unchanged     ?  ? Progression  ? Progression Continue to progress workloads to maintain intensity without signs/symptoms of physical distress. Continue to progress workloads to maintain intensity without signs/symptoms of physical distress.     ? Average METs 2.3 3.4     ?  ? Resistance Training  ? Training Prescription Yes Yes     ? Weight 3 lbs 4 lbs     ? Reps 10-15 10-15     ? Time 10 Minutes 10 Minutes     ?  ? Interval Training  ? Interval Training No No     ?  ? Recumbant Bike  ? Level 2 3     ? Minutes 10 15     ? METs 2.5 4.3     ?  ? NuStep  ? Level 3  Late arrival, 1st 5 minutes at level 2.0, last 10 minutes at level 3.0. 3     ? SPM 75 85     ? Minutes 15 15     ? METs 2.2 2.5     ? ?  ?  ? ?  ? ? ?Exercise Comments: ? ? Exercise Comments   ? ? [place] Name 05/02/21 1132 05/09/21 1054  ?  ?  ?  ? Exercise Comments Patient tolerated first session of exercis well without symptoms. Reviewed METs with patient.     ? ?  ?  ? ?  ? ? ?Exercise Goals and Review: ? ? Exercise Goals   ? ? [place] Name 04/26/21 2831  ?  ?  ?  ?  ?  ? Exercise Goals  ? Increase Physical  Activity Yes      ? Intervention Provide advice, education, support and counseling about physical activity/exercise needs.;Develop an individualized exercise prescription for aerobic and resistive training

## 2021-05-11 ENCOUNTER — Encounter (HOSPITAL_COMMUNITY)
Admission: RE | Admit: 2021-05-11 | Discharge: 2021-05-11 | Disposition: A | Discharge status: Home / Self Care | Payer: Medicare HMO | Source: Ambulatory Visit | Attending: Cardiology | Admitting: Cardiology

## 2021-05-11 DIAGNOSIS — Z955 Presence of coronary angioplasty implant and graft: Secondary | ICD-10-CM | POA: Diagnosis not present

## 2021-05-11 DIAGNOSIS — Z48812 Encounter for surgical aftercare following surgery on the circulatory system: Secondary | ICD-10-CM | POA: Diagnosis not present

## 2021-05-13 ENCOUNTER — Encounter (HOSPITAL_COMMUNITY)
Admission: RE | Admit: 2021-05-13 | Discharge: 2021-05-13 | Disposition: A | Discharge status: Home / Self Care | Payer: Medicare HMO | Source: Ambulatory Visit | Attending: Cardiology | Admitting: Cardiology

## 2021-05-13 DIAGNOSIS — Z48812 Encounter for surgical aftercare following surgery on the circulatory system: Secondary | ICD-10-CM | POA: Diagnosis not present

## 2021-05-13 DIAGNOSIS — Z955 Presence of coronary angioplasty implant and graft: Secondary | ICD-10-CM

## 2021-05-16 ENCOUNTER — Encounter (HOSPITAL_COMMUNITY)
Admission: RE | Admit: 2021-05-16 | Discharge: 2021-05-16 | Disposition: A | Discharge status: Home / Self Care | Payer: Medicare HMO | Source: Ambulatory Visit | Attending: Cardiology | Admitting: Cardiology

## 2021-05-16 DIAGNOSIS — Z48812 Encounter for surgical aftercare following surgery on the circulatory system: Secondary | ICD-10-CM | POA: Diagnosis not present

## 2021-05-16 DIAGNOSIS — Z955 Presence of coronary angioplasty implant and graft: Secondary | ICD-10-CM

## 2021-05-16 NOTE — Progress Notes (Signed)
Reviewed home exercise guidelines with patient including endpoints, temperature precautions, target heart rate and rate of perceived exertion. Patient plans to walk and use his treadmill at home as his mode of home exercise. Patient voices understanding of instructions given. ? ?Sol Passer, MS, ACSM CEP ? ?

## 2021-05-17 DIAGNOSIS — M791 Myalgia, unspecified site: Secondary | ICD-10-CM | POA: Diagnosis not present

## 2021-05-18 ENCOUNTER — Encounter (HOSPITAL_COMMUNITY)
Admission: RE | Admit: 2021-05-18 | Discharge: 2021-05-18 | Disposition: A | Discharge status: Home / Self Care | Payer: Medicare HMO | Source: Ambulatory Visit | Attending: Cardiology | Admitting: Cardiology

## 2021-05-18 DIAGNOSIS — Z955 Presence of coronary angioplasty implant and graft: Secondary | ICD-10-CM

## 2021-05-18 DIAGNOSIS — Z48812 Encounter for surgical aftercare following surgery on the circulatory system: Secondary | ICD-10-CM | POA: Diagnosis not present

## 2021-05-20 ENCOUNTER — Encounter (HOSPITAL_COMMUNITY)
Admission: RE | Admit: 2021-05-20 | Discharge: 2021-05-20 | Disposition: A | Discharge status: Home / Self Care | Payer: Medicare HMO | Source: Ambulatory Visit | Attending: Cardiology | Admitting: Cardiology

## 2021-05-20 DIAGNOSIS — Z955 Presence of coronary angioplasty implant and graft: Secondary | ICD-10-CM | POA: Diagnosis not present

## 2021-05-20 DIAGNOSIS — Z48812 Encounter for surgical aftercare following surgery on the circulatory system: Secondary | ICD-10-CM | POA: Diagnosis not present

## 2021-05-23 ENCOUNTER — Encounter (HOSPITAL_COMMUNITY)
Admission: RE | Admit: 2021-05-23 | Discharge: 2021-05-23 | Disposition: A | Discharge status: Home / Self Care | Payer: Medicare HMO | Source: Ambulatory Visit | Attending: Cardiology | Admitting: Cardiology

## 2021-05-23 DIAGNOSIS — Z955 Presence of coronary angioplasty implant and graft: Secondary | ICD-10-CM

## 2021-05-23 DIAGNOSIS — Z48812 Encounter for surgical aftercare following surgery on the circulatory system: Secondary | ICD-10-CM | POA: Diagnosis not present

## 2021-05-24 DIAGNOSIS — I10 Essential (primary) hypertension: Secondary | ICD-10-CM | POA: Diagnosis not present

## 2021-05-24 DIAGNOSIS — R590 Localized enlarged lymph nodes: Secondary | ICD-10-CM | POA: Diagnosis not present

## 2021-05-24 DIAGNOSIS — M791 Myalgia, unspecified site: Secondary | ICD-10-CM | POA: Diagnosis not present

## 2021-05-24 DIAGNOSIS — Z955 Presence of coronary angioplasty implant and graft: Secondary | ICD-10-CM | POA: Diagnosis not present

## 2021-05-24 DIAGNOSIS — D472 Monoclonal gammopathy: Secondary | ICD-10-CM | POA: Diagnosis not present

## 2021-05-24 DIAGNOSIS — I2510 Atherosclerotic heart disease of native coronary artery without angina pectoris: Secondary | ICD-10-CM | POA: Diagnosis not present

## 2021-05-25 ENCOUNTER — Encounter (HOSPITAL_COMMUNITY)
Admission: RE | Admit: 2021-05-25 | Discharge: 2021-05-25 | Disposition: A | Discharge status: Home / Self Care | Payer: Medicare HMO | Source: Ambulatory Visit | Attending: Cardiology | Admitting: Cardiology

## 2021-05-25 DIAGNOSIS — Z955 Presence of coronary angioplasty implant and graft: Secondary | ICD-10-CM

## 2021-05-25 DIAGNOSIS — Z48812 Encounter for surgical aftercare following surgery on the circulatory system: Secondary | ICD-10-CM | POA: Diagnosis not present

## 2021-05-27 ENCOUNTER — Encounter (HOSPITAL_COMMUNITY)
Admission: RE | Admit: 2021-05-27 | Discharge: 2021-05-27 | Disposition: A | Discharge status: Home / Self Care | Payer: Medicare HMO | Source: Ambulatory Visit | Attending: Cardiology | Admitting: Cardiology

## 2021-05-27 DIAGNOSIS — Z48812 Encounter for surgical aftercare following surgery on the circulatory system: Secondary | ICD-10-CM | POA: Diagnosis not present

## 2021-05-27 DIAGNOSIS — Z955 Presence of coronary angioplasty implant and graft: Secondary | ICD-10-CM | POA: Diagnosis not present

## 2021-06-01 ENCOUNTER — Encounter (HOSPITAL_COMMUNITY)
Admission: RE | Admit: 2021-06-01 | Discharge: 2021-06-01 | Disposition: A | Discharge status: Home / Self Care | Payer: Medicare HMO | Source: Ambulatory Visit | Attending: Cardiology | Admitting: Cardiology

## 2021-06-01 DIAGNOSIS — Z48812 Encounter for surgical aftercare following surgery on the circulatory system: Secondary | ICD-10-CM | POA: Diagnosis not present

## 2021-06-01 DIAGNOSIS — Z955 Presence of coronary angioplasty implant and graft: Secondary | ICD-10-CM | POA: Diagnosis not present

## 2021-06-03 ENCOUNTER — Encounter (HOSPITAL_COMMUNITY)
Admission: RE | Admit: 2021-06-03 | Discharge: 2021-06-03 | Disposition: A | Discharge status: Home / Self Care | Payer: Medicare HMO | Source: Ambulatory Visit | Attending: Cardiology | Admitting: Cardiology

## 2021-06-03 DIAGNOSIS — Z955 Presence of coronary angioplasty implant and graft: Secondary | ICD-10-CM | POA: Diagnosis not present

## 2021-06-06 ENCOUNTER — Encounter (HOSPITAL_COMMUNITY)
Admission: RE | Admit: 2021-06-06 | Discharge: 2021-06-06 | Disposition: A | Discharge status: Home / Self Care | Payer: Medicare HMO | Source: Ambulatory Visit | Attending: Cardiology | Admitting: Cardiology

## 2021-06-06 DIAGNOSIS — Z955 Presence of coronary angioplasty implant and graft: Secondary | ICD-10-CM | POA: Diagnosis not present

## 2021-06-06 NOTE — Progress Notes (Signed)
Cardiac Individual Treatment Plan  Patient Details  Name: Dakota Branch MRN: [id number] Date of Birth: 1950-01-18 Referring Provider:   Flowsheet Row CARDIAC REHAB PHASE II ORIENTATION from 04/26/2021 in [place] [facility]  Referring Provider Laurance Flatten, Gaston Islam, MD ([place])  Radford Pax, Eber Hong, MD (coverage)]       Initial Encounter Date:  [place] PHASE II ORIENTATION from 04/26/2021 in [facility]  Date 04/26/21       Visit Diagnosis: 01/31/21 DES LAD  Patient's Home Medications on Admission:  Current Outpatient Medications:    amLODipine (NORVASC) 5 MG tablet, TAKE 1 TABLET BY MOUTH EVERY DAY, Disp: 90 tablet, Rfl: 2   aspirin EC 81 MG tablet, Take 1 tablet (81 mg total) by mouth daily. Swallow whole., Disp: 90 tablet, Rfl: 3   chlorthalidone (HYGROTON) 25 MG tablet, Take 1 tablet (25 mg total) by mouth daily. (Patient not taking: Reported on 04/25/2021), Disp: 90 tablet, Rfl: 3   clopidogrel (PLAVIX) 75 MG tablet, Take 1 tablet (75 mg total) by mouth daily., Disp: 90 tablet, Rfl: 3   Iron Combinations (IRON COMPLEX PO), Take 1 capsule by mouth daily., Disp: , Rfl:    MAGNESIUM PO, Take 1 capsule by mouth daily. magnesium ashwagandha, Disp: , Rfl:    Melatonin 12 MG TABS, Take 12 mg by mouth at bedtime as needed (sleep)., Disp: , Rfl:    metoprolol succinate (TOPROL XL) 25 MG 24 hr tablet, Take 1 tablet (25 mg total) by mouth daily., Disp: 30 tablet, Rfl: 11   Multiple Vitamins-Minerals (MULTIVITAMIN WITH MINERALS) tablet, Take 1 tablet by mouth daily. 55+, Disp: , Rfl:    nitroGLYCERIN (NITROSTAT) 0.4 MG SL tablet, Place 1 tablet (0.4 mg total) under the tongue every 5 (five) minutes as needed for chest pain., Disp: 100 tablet, Rfl: 3   Omega-3 Fatty Acids ([place] FISH OIL PO), Take 1,000 mg by mouth every other day., Disp: , Rfl:    Omega-3 Fatty Acids (OMEGA-3 FISH OIL PO), Take 1 capsule by mouth every other  day., Disp: , Rfl:    rosuvastatin (CRESTOR) 40 MG tablet, Take 1 tablet (40 mg total) by mouth daily., Disp: 90 tablet, Rfl: 4   sildenafil (REVATIO) 20 MG tablet, Take 10 mg by mouth daily as needed (ED)., Disp: , Rfl:    triamcinolone cream (KENALOG) 0.1 %, Apply 1 application topically 2 (two) times daily as needed., Disp: 45 g, Rfl: 1   TURMERIC CURCUMIN PO, Take 1 tablet by mouth daily., Disp: , Rfl:    Zoster Vaccine Adjuvanted ([place]) injection, Inject into the muscle., Disp: 0.5 mL, Rfl: 1  Past Medical History: Past Medical History:  Diagnosis Date   Colon polyps    Coronary artery disease    ED (erectile dysfunction)    GERD (gastroesophageal reflux disease)    Hyperlipidemia    Hypertension    OSA (obstructive sleep apnea)     Tobacco Use: Social History   Tobacco Use  Smoking Status Never  Smokeless Tobacco Never    Labs: Review Flowsheet        Latest Ref Rng & Units 03/04/2015 04/05/2016 04/09/2017 11/07/2019  Labs for ITP Cardiac and Pulmonary Rehab  Cholestrol 0 - 200 mg/dL 183   190   191   203    LDL (calc) 0 - 99 mg/dL 123   136   131   146    HDL-C >39.00 mg/dL 30.80  31.60   32.50   38.20    Trlycerides 0.0 - 149.0 mg/dL 145.0   112.0   139.0   96.0    Hemoglobin A1c 4.6 - 6.5 %   4.7        10/08/2020  Labs for ITP Cardiac and Pulmonary Rehab  Cholestrol 196    LDL (calc) 137    HDL-C 40.80    Trlycerides 94.0    Hemoglobin A1c           Capillary Blood Glucose: No results found for: GLUCAP   Exercise Target Goals: Exercise Program Goal: Individual exercise prescription set using results from initial 6 min walk test and THRR while considering  patient's activity barriers and safety.   Exercise Prescription Goal: Initial exercise prescription builds to 30-45 minutes a day of aerobic activity, 2-3 days per week.  Home exercise guidelines will be given to patient during program as part of exercise prescription that the participant will  acknowledge.  Activity Barriers & Risk Stratification:  Activity Barriers & Cardiac Risk Stratification - 04/26/21 1028       Activity Barriers & Cardiac Risk Stratification   Activity Barriers History of Falls;Other (comment)    Comments Torn right rotator cuff. Trigger finger, arthritis both hands, effects grip.    Cardiac Risk Stratification Low             6 Minute Walk:  6 Minute Walk     Row Name 04/26/21 1011         6 Minute Walk   Phase Initial     Distance 1727 feet     Walk Time 6 minutes     # of Rest Breaks 0     MPH 3.27     METS 3.61     RPE 9     Perceived Dyspnea  0     VO2 Peak 12.64     Symptoms No     Resting HR 64 bpm     Resting BP 122/60     Resting Oxygen Saturation  98 %     Exercise Oxygen Saturation  during 6 min walk 99 %     Max Ex. HR 82 bpm     Max Ex. BP 130/62     2 Minute Post BP 110/62              Oxygen Initial Assessment:   Oxygen Re-Evaluation:   Oxygen Discharge (Final Oxygen Re-Evaluation):   Initial Exercise Prescription:  Initial Exercise Prescription - 04/26/21 1100       Date of Initial Exercise RX and Referring Provider   Date 04/26/21    Referring Provider Edwin Dada, MD ([facility])   Sueanne Margarita, MD (coverage)   Expected Discharge Date 06/24/21      Recumbant Bike   Level 2    Watts 40    Minutes 15    METs 3.48      NuStep   Level 3    SPM 85    Minutes 15    METs 3.2      Prescription Details   Frequency (times per week) 3    Duration Progress to 30 minutes of continuous aerobic without signs/symptoms of physical distress      Intensity   THRR 40-80% of Max Heartrate 61-122    Ratings of Perceived Exertion 11-13    Perceived Dyspnea 0-4      Progression   Progression Continue to progress workloads to maintain  intensity without signs/symptoms of physical distress.      Resistance Training   Training Prescription Yes    Weight 3 lbs    Reps 10-15             Perform  Capillary Blood Glucose checks as needed.  Exercise Prescription Changes:   Exercise Prescription Changes     Row Name 05/02/21 1045 05/09/21 1022 05/16/21 1021 05/23/21 1019 06/06/21 1028     Response to Exercise   Blood Pressure (Admit) 110/74 122/58 120/70 108/68 106/62   Blood Pressure (Exercise) 112/58 106/62 118/60 122/70 120/62   Blood Pressure (Exit) 104/60 110/60 102/62 102/62 98/56   Heart Rate (Admit) 63 bpm 71 bpm 73 bpm 64 bpm 76 bpm   Heart Rate (Exercise) 89 bpm 99 bpm 93 bpm 90 bpm 109 bpm   Heart Rate (Exit) 63 bpm 65 bpm 70 bpm 67 bpm 68 bpm   Rating of Perceived Exertion (Exercise) '11 11 12 12 11   '$ Symptoms None None None None None   Comments Off to a good start with exercise. Increased WL on RB. Reviewed home exercise guidelines and goals. -- Switched order today RB 1st, SE 2nd. One on one orientation to core stretches.   Duration Progress to 30 minutes of  aerobic without signs/symptoms of physical distress Progress to 30 minutes of  aerobic without signs/symptoms of physical distress Progress to 30 minutes of  aerobic without signs/symptoms of physical distress Progress to 30 minutes of  aerobic without signs/symptoms of physical distress Progress to 30 minutes of  aerobic without signs/symptoms of physical distress   Intensity THRR unchanged THRR unchanged THRR unchanged THRR unchanged THRR unchanged     Progression   Progression Continue to progress workloads to maintain intensity without signs/symptoms of physical distress. Continue to progress workloads to maintain intensity without signs/symptoms of physical distress. Continue to progress workloads to maintain intensity without signs/symptoms of physical distress. Continue to progress workloads to maintain intensity without signs/symptoms of physical distress. Continue to progress workloads to maintain intensity without signs/symptoms of physical distress.   Average METs 2.3 3.4 2.5 2.7 3.3     Resistance Training    Training Prescription Yes Yes Yes Yes Yes   Weight 3 lbs 4 lbs 3 lbs 3 lbs 3 lbs   Reps 10-15 10-15 10-15 10-15 10-15   Time 10 Minutes 10 Minutes 10 Minutes 10 Minutes 10 Minutes     Interval Training   Interval Training No No No No No     Recumbant Bike   Level '2 3 3 3 3   '$ Minutes '10 15 15 15 15   '$ METs 2.5 4.3 2.4 2.9 3.1     NuStep   Level 3  Late arrival, 1st 5 minutes at level 2.0, last 10 minutes at level 3.0. '3 3 3 5   '$ SPM 75 85 85 85 85   Minutes '15 15 15 15 15   '$ METs 2.2 2.5 2.5 2.6 3.5     Home Exercise Plan   Plans to continue exercise at -- -- Home (comment)  TM, walking Home (comment)  TM, walking Home (comment)  TM, walking   Frequency -- -- Add 2 additional days to program exercise sessions. Add 2 additional days to program exercise sessions. Add 2 additional days to program exercise sessions.   Initial Home Exercises Provided -- -- 05/16/21 05/16/21 05/16/21            Exercise Comments:   Exercise Comments  [place] Name 05/02/21 1132 05/09/21 1054 05/16/21 1050 05/23/21 1117 06/03/21 1018   Exercise Comments Patient tolerated first session of exercis well without symptoms. Reviewed METs with patient. Reviewed home exercise guidelines and goals with patient. Reviewed METs with patient. Reviewed goals with patient. Increased WL on SE today from level 4 to 5 today and tolerated well.    [place] Name 06/06/21 1200           Exercise Comments Reviewed METs and goals with patient. Oriented patient to core strengthening stretches that he can practice at home in addition to his aerobic workout. Patient requested to switch order of RB and SE at cardiac rehab, this change was made to his exercise prescription.                Exercise Goals and Review:   Exercise Goals     Row Name 04/26/21 0958             Exercise Goals   Increase Physical Activity Yes       Intervention Provide advice, education, support and counseling about physical  activity/exercise needs.;Develop an individualized exercise prescription for aerobic and resistive training based on initial evaluation findings, risk stratification, comorbidities and participant's personal goals.       Expected Outcomes Short Term: Attend rehab on a regular basis to increase amount of physical activity.;Long Term: Exercising regularly at least 3-5 days a week.;Long Term: Add in home exercise to make exercise part of routine and to increase amount of physical activity.       Increase Strength and Stamina Yes       Intervention Provide advice, education, support and counseling about physical activity/exercise needs.;Develop an individualized exercise prescription for aerobic and resistive training based on initial evaluation findings, risk stratification, comorbidities and participant's personal goals.       Expected Outcomes Short Term: Increase workloads from initial exercise prescription for resistance, speed, and METs.;Short Term: Perform resistance training exercises routinely during rehab and add in resistance training at home;Long Term: Improve cardiorespiratory fitness, muscular endurance and strength as measured by increased METs and functional capacity (6MWT)       Able to understand and use rate of perceived exertion (RPE) scale Yes       Intervention Provide education and explanation on how to use RPE scale       Expected Outcomes Short Term: Able to use RPE daily in rehab to express subjective intensity level;Long Term:  Able to use RPE to guide intensity level when exercising independently       Knowledge and understanding of Target Heart Rate Range (THRR) Yes       Intervention Provide education and explanation of THRR including how the numbers were predicted and where they are located for reference       Expected Outcomes Short Term: Able to state/look up THRR;Long Term: Able to use THRR to govern intensity when exercising independently;Short Term: Able to use daily as  guideline for intensity in rehab       Able to check pulse independently Yes       Intervention Provide education and demonstration on how to check pulse in carotid and radial arteries.;Review the importance of being able to check your own pulse for safety during independent exercise       Expected Outcomes Short Term: Able to explain why pulse checking is important during independent exercise;Long Term: Able to check pulse independently and accurately       Understanding of Exercise Prescription  Yes       Intervention Provide education, explanation, and written materials on patient's individual exercise prescription       Expected Outcomes Short Term: Able to explain program exercise prescription;Long Term: Able to explain home exercise prescription to exercise independently                Exercise Goals Re-Evaluation :  Exercise Goals Re-Evaluation     Row Name 05/02/21 1132 05/16/21 1050 06/03/21 1018 06/06/21 1200       Exercise Goal Re-Evaluation   Exercise Goals Review Increase Physical Activity;Able to understand and use rate of perceived exertion (RPE) scale Increase Physical Activity;Able to understand and use rate of perceived exertion (RPE) scale;Understanding of Exercise Prescription;Increase Strength and Stamina;Able to check pulse independently;Knowledge and understanding of Target Heart Rate Range (THRR) Increase Physical Activity;Able to understand and use rate of perceived exertion (RPE) scale;Understanding of Exercise Prescription;Increase Strength and Stamina;Able to check pulse independently;Knowledge and understanding of Target Heart Rate Range (THRR) Increase Physical Activity;Able to understand and use rate of perceived exertion (RPE) scale;Understanding of Exercise Prescription;Increase Strength and Stamina;Able to check pulse independently;Knowledge and understanding of Target Heart Rate Range (THRR)    Comments Patient able to understand and use RPE scale  appropriately. Reviewed exercise prescription with patient. Patient plans to walk and use his treadmill at home as his mode of home exercise. Patient has a smart watch to monitor his pulse. Patient is walking on his treadmill 30 minutes and his elliptical 15 minutes daily in addition to exercise at cardiac rehab. Patient is interested in strengthening his core. Will print and review some core stretches that patient can do at home. Patient would like to increase his core strength. Oriented patient to core strengthening stretches that he can do at home. Patient very receptive to instructions given. Patient has been walking on his TM at home but has had some mild knee discomfort, which he doesn't have when walking outdoors. Advised patient to use his stationary bike and walk outdoors and rest from the TM since it gives him knee pain for now.    Expected Outcomes Progress workloads as tolerated to help increase cardiorespiratory fitness. Patient will walk outdoors or on his TM at home 30 minutes 2 days/week in addition to exercise at cardiac rehab to achieve 150 minutes of aerobic exercise/week Patient will continue current daily exercise routine and will add core stretches to help strengthen his core. Patient will add core strengthening stretches to exercise routine.             Discharge Exercise Prescription (Final Exercise Prescription Changes):  Exercise Prescription Changes - 06/06/21 1028       Response to Exercise   Blood Pressure (Admit) 106/62    Blood Pressure (Exercise) 120/62    Blood Pressure (Exit) 98/56    Heart Rate (Admit) 76 bpm    Heart Rate (Exercise) 109 bpm    Heart Rate (Exit) 68 bpm    Rating of Perceived Exertion (Exercise) 11    Symptoms None    Comments Switched order today RB 1st, SE 2nd. One on one orientation to core stretches.    Duration Progress to 30 minutes of  aerobic without signs/symptoms of physical distress    Intensity THRR unchanged      Progression    Progression Continue to progress workloads to maintain intensity without signs/symptoms of physical distress.    Average METs 3.3      Resistance Training   Training Prescription Yes  Weight 3 lbs    Reps 10-15    Time 10 Minutes      Interval Training   Interval Training No      Recumbant Bike   Level 3    Minutes 15    METs 3.1      NuStep   Level 5    SPM 85    Minutes 15    METs 3.5      Home Exercise Plan   Plans to continue exercise at Home (comment)   TM, walking   Frequency Add 2 additional days to program exercise sessions.    Initial Home Exercises Provided 05/16/21             Nutrition:  Target Goals: Understanding of nutrition guidelines, daily intake of sodium '1500mg'$ , cholesterol '200mg'$ , calories 30% from fat and 7% or less from saturated fats, daily to have 5 or more servings of fruits and vegetables.  Biometrics:  Pre Biometrics - 04/26/21 0955       Pre Biometrics   Waist Circumference 39.25 inches    Hip Circumference 39.75 inches    Waist to Hip Ratio 0.99 %    Triceps Skinfold 12 mm    % Body Fat 26.1 %    Grip Strength 34 kg    Flexibility 11.5 in    Single Leg Stand 30 seconds              Nutrition Therapy Plan and Nutrition Goals:  Nutrition Therapy & Goals - 05/23/21 1115       Nutrition Therapy   Diet Heart Healthy Diet    Drug/Food Interactions Statins/Certain Fruits      Personal Nutrition Goals   Nutrition Goal Patient to describe the benefit of including fruits, vegetables, whole grains, low fat dairy, lean protein/plant protein as part of a heart healthy meal plan.    Personal Goal #2 Patient to identify and limit food sources of saturated fats, trans fats, refined carbohydrates, and sodium    Comments Reviewed nutrition intake and goals. Answered patient questions regarding fasting; recommended 12:8 schedule. Gave handouts on the Mediterranean Diet and daily serving recommendations from each food group.       Intervention Plan   Intervention Prescribe, educate and counsel regarding individualized specific dietary modifications aiming towards targeted core components such as weight, hypertension, lipid management, diabetes, heart failure and other comorbidities.;Nutrition handout(s) given to patient.    Expected Outcomes Short Term Goal: Understand basic principles of dietary content, such as calories, fat, sodium, cholesterol and nutrients.;Long Term Goal: Adherence to prescribed nutrition plan.             Nutrition Assessments:  Nutrition Assessments - 05/04/21 0954       Rate Your Plate Scores   Pre Score 74            MEDIFICTS Score Key: ?70 Need to make dietary changes  40-70 Heart Healthy Diet ? 40 Therapeutic Level Cholesterol Diet   Flowsheet Row CARDIAC REHAB PHASE II EXERCISE from 05/02/2021 in [facility]  Picture Your Plate Total Score on Admission 74      Picture Your Plate Scores: <45 Unhealthy dietary pattern with much room for improvement. 41-50 Dietary pattern unlikely to meet recommendations for good health and room for improvement. 51-60 More healthful dietary pattern, with some room for improvement.  >60 Healthy dietary pattern, although there may be some specific behaviors that could be improved.    Nutrition Goals Re-Evaluation:  Nutrition Goals Re-Evaluation:   Nutrition Goals Discharge (Final Nutrition Goals Re-Evaluation):   Psychosocial: Target Goals: Acknowledge presence or absence of significant depression and/or stress, maximize coping skills, provide positive support system. Participant is able to verbalize types and ability to use techniques and skills needed for reducing stress and depression.  Initial Review & Psychosocial Screening:  Initial Psych Review & Screening - 04/26/21 1614       Initial Review   Current issues with None Identified      Family Dynamics   Good Support System? Yes   Mr  Depaulo has his wife for support     Barriers   Psychosocial barriers to participate in program There are no identifiable barriers or psychosocial needs.      Screening Interventions   Interventions Encouraged to exercise             Quality of Life Scores:  Quality of Life - 04/26/21 1122       Quality of Life   Select Quality of Life      Quality of Life Scores   Health/Function Pre 28.4 %    Socioeconomic Pre 30 %    Psych/Spiritual Pre 24.86 %    Family Pre 27.6 %    GLOBAL Pre 27.82 %            Scores of 19 and below usually indicate a poorer quality of life in these areas.  A difference of  2-3 points is a clinically meaningful difference.  A difference of 2-3 points in the total score of the Quality of Life Index has been associated with significant improvement in overall quality of life, self-image, physical symptoms, and general health in studies assessing change in quality of life.  PHQ-9: Review Flowsheet        04/26/2021 11/19/2020 06/15/2020 11/14/2019 09/01/2016  Depression screen PHQ 2/9  Decreased Interest 0 0 0 0 1  Down, Depressed, Hopeless 0 0 0 0 0  PHQ - 2 Score 0 0 0 0 1         Interpretation of Total Score  Total Score Depression Severity:  1-4 = Minimal depression, 5-9 = Mild depression, 10-14 = Moderate depression, 15-19 = Moderately severe depression, 20-27 = Severe depression   Psychosocial Evaluation and Intervention:   Psychosocial Re-Evaluation:  Psychosocial Re-Evaluation     [place] Name 05/02/21 1128 05/10/21 0829 06/06/21 1749         Psychosocial Re-Evaluation   Current issues with None Identified None Identified None Identified     Interventions Encouraged to attend Cardiac Rehabilitation for the exercise Encouraged to attend Cardiac Rehabilitation for the exercise Encouraged to attend Cardiac Rehabilitation for the exercise     Continue Psychosocial Services  No Follow up required No Follow up required No Follow up  required              Psychosocial Discharge (Final Psychosocial Re-Evaluation):  Psychosocial Re-Evaluation - 06/06/21 1749       Psychosocial Re-Evaluation   Current issues with None Identified    Interventions Encouraged to attend Cardiac Rehabilitation for the exercise    Continue Psychosocial Services  No Follow up required             Vocational Rehabilitation: Provide vocational rehab assistance to qualifying candidates.   Vocational Rehab Evaluation & Intervention:  Vocational Rehab - 04/26/21 1616       Initial Vocational Rehab Evaluation & Intervention   Assessment shows need for Vocational Rehabilitation No  Mr Heigl works full time and does not need vocational rehab at this time.            Education: Education Goals: Education classes will be provided on a weekly basis, covering required topics. Participant will state understanding/return demonstration of topics presented.  Learning Barriers/Preferences:  Learning Barriers/Preferences - 04/26/21 1615       Learning Barriers/Preferences   Learning Barriers Sight   wars glasses   Learning Preferences Audio;Skilled Demonstration;Computer/Internet;Verbal Instruction;Group Instruction;Video;Written Material;Individual Instruction;Pictoral             Education Topics: Count Your Pulse:  -Group instruction provided by verbal instruction, demonstration, patient participation and written materials to support subject.  Instructors address importance of being able to find your pulse and how to count your pulse when at home without a heart monitor.  Patients get hands on experience counting their pulse with staff help and individually.   Heart Attack, Angina, and Risk Factor Modification:  -Group instruction provided by verbal instruction, video, and written materials to support subject.  Instructors address signs and symptoms of angina and heart attacks.    Also discuss risk factors for heart  disease and how to make changes to improve heart health risk factors.   Functional Fitness:  -Group instruction provided by verbal instruction, demonstration, patient participation, and written materials to support subject.  Instructors address safety measures for doing things around the house.  Discuss how to get up and down off the floor, how to pick things up properly, how to safely get out of a chair without assistance, and balance training.   Meditation and Mindfulness:  -Group instruction provided by verbal instruction, patient participation, and written materials to support subject.  Instructor addresses importance of mindfulness and meditation practice to help reduce stress and improve awareness.  Instructor also leads participants through a meditation exercise.    Stretching for Flexibility and Mobility:  -Group instruction provided by verbal instruction, patient participation, and written materials to support subject.  Instructors lead participants through series of stretches that are designed to increase flexibility thus improving mobility.  These stretches are additional exercise for major muscle groups that are typically performed during regular warm up and cool down.   Hands Only CPR:  -Group verbal, video, and participation provides a basic overview of AHA guidelines for community CPR. Role-play of emergencies allow participants the opportunity to practice calling for help and chest compression technique with discussion of AED use.   Hypertension: -Group verbal and written instruction that provides a basic overview of hypertension including the most recent diagnostic guidelines, risk factor reduction with self-care instructions and medication management.    Nutrition I class: Heart Healthy Eating:  -Group instruction provided by PowerPoint slides, verbal discussion, and written materials to support subject matter. The instructor gives an explanation and review of the  Therapeutic Lifestyle Changes diet recommendations, which includes a discussion on lipid goals, dietary fat, sodium, fiber, plant stanol/sterol esters, sugar, and the components of a well-balanced, healthy diet.   Nutrition II class: Lifestyle Skills:  -Group instruction provided by PowerPoint slides, verbal discussion, and written materials to support subject matter. The instructor gives an explanation and review of label reading, grocery shopping for heart health, heart healthy recipe modifications, and ways to make healthier choices when eating out.   Diabetes Question & Answer:  -Group instruction provided by PowerPoint slides, verbal discussion, and written materials to support subject matter. The instructor gives an explanation and review of diabetes co-morbidities, pre- and post-prandial blood  glucose goals, pre-exercise blood glucose goals, signs, symptoms, and treatment of hypoglycemia and hyperglycemia, and foot care basics.   Diabetes Blitz:  -Group instruction provided by PowerPoint slides, verbal discussion, and written materials to support subject matter. The instructor gives an explanation and review of the physiology behind type 1 and type 2 diabetes, diabetes medications and rational behind using different medications, pre- and post-prandial blood glucose recommendations and Hemoglobin A1c goals, diabetes diet, and exercise including blood glucose guidelines for exercising safely.    Portion Distortion:  -Group instruction provided by PowerPoint slides, verbal discussion, written materials, and food models to support subject matter. The instructor gives an explanation of serving size versus portion size, changes in portions sizes over the last 20 years, and what consists of a serving from each food group.   Stress Management:  -Group instruction provided by verbal instruction, video, and written materials to support subject matter.  Instructors review role of stress in heart  disease and how to cope with stress positively.     Exercising on Your Own:  -Group instruction provided by verbal instruction, power point, and written materials to support subject.  Instructors discuss benefits of exercise, components of exercise, frequency and intensity of exercise, and end points for exercise.  Also discuss use of nitroglycerin and activating EMS.  Review options of places to exercise outside of rehab.  Review guidelines for sex with heart disease.   Cardiac Drugs I:  -Group instruction provided by verbal instruction and written materials to support subject.  Instructor reviews cardiac drug classes: antiplatelets, anticoagulants, beta blockers, and statins.  Instructor discusses reasons, side effects, and lifestyle considerations for each drug class.   Cardiac Drugs II:  -Group instruction provided by verbal instruction and written materials to support subject.  Instructor reviews cardiac drug classes: angiotensin converting enzyme inhibitors (ACE-I), angiotensin II receptor blockers (ARBs), nitrates, and calcium channel blockers.  Instructor discusses reasons, side effects, and lifestyle considerations for each drug class.   Anatomy and Physiology of the Circulatory System:  Group verbal and written instruction and models provide basic cardiac anatomy and physiology, with the coronary electrical and arterial systems. Review of: AMI, Angina, Valve disease, Heart Failure, Peripheral Artery Disease, Cardiac Arrhythmia, Pacemakers, and the ICD.   Other Education:  -Group or individual verbal, written, or video instructions that support the educational goals of the cardiac rehab program.   Holiday Eating Survival Tips:  -Group instruction provided by PowerPoint slides, verbal discussion, and written materials to support subject matter. The instructor gives patients tips, tricks, and techniques to help them not only survive but enjoy the holidays despite the onslaught of food  that accompanies the holidays.   Knowledge Questionnaire Score:  Knowledge Questionnaire Score - 04/26/21 1119       Knowledge Questionnaire Score   Pre Score 17/24             Core Components/Risk Factors/Patient Goals at Admission:  Personal Goals and Risk Factors at Admission - 04/26/21 0957       Core Components/Risk Factors/Patient Goals on Admission   Hypertension Yes    Intervention Provide education on lifestyle modifcations including regular physical activity/exercise, weight management, moderate sodium restriction and increased consumption of fresh fruit, vegetables, and low fat dairy, alcohol moderation, and smoking cessation.;Monitor prescription use compliance.    Expected Outcomes Short Term: Continued assessment and intervention until BP is < 140/31m HG in hypertensive participants. < 130/845mHG in hypertensive participants with diabetes, heart failure or chronic kidney disease.;Long Term:  Maintenance of blood pressure at goal levels.    Lipids Yes    Intervention Provide education and support for participant on nutrition & aerobic/resistive exercise along with prescribed medications to achieve LDL '70mg'$ , HDL >'40mg'$ .    Expected Outcomes Short Term: Participant states understanding of desired cholesterol values and is compliant with medications prescribed. Participant is following exercise prescription and nutrition guidelines.;Long Term: Cholesterol controlled with medications as prescribed, with individualized exercise RX and with personalized nutrition plan. Value goals: LDL < '70mg'$ , HDL > 40 mg.    Personal Goal Other Yes    Personal Goal Adopt a healthy lifestyle.    Intervention Provide participant education on lifestyle modification including regular exercise and nutrition to help maintain a healthy lifestyle.    Expected Outcomes Participant is following exercise prescription and nutrition guidelines.             Core Components/Risk Factors/Patient Goals  Review:   Goals and Risk Factor Review     Row Name 05/02/21 1128 05/10/21 0829 06/06/21 1749         Core Components/Risk Factors/Patient Goals Review   Personal Goals Review Weight Management/Obesity;Hypertension;Lipids Weight Management/Obesity;Hypertension;Lipids Weight Management/Obesity;Hypertension;Lipids     Review Arsal did well with exercise at cardiac rehab on 05/02/21. Vital signs were stable Kebron is off to a good start to exercise. Vital signs have been  stable Chrsitopher is doing well exercise. Vital signs have been  stable. Jumaane will complete phase 2 cardiac rehab on 06/24/21.     Expected Outcomes Lillie will continue to participate in phase 2 cardiac rehab for exercise, nutrition and lifestyle modifications Loras will continue to participate in phase 2 cardiac rehab for exercise, nutrition and lifestyle modifications Lindell will continue to participate in phase 2 cardiac rehab for exercise, nutrition and lifestyle modifications              Core Components/Risk Factors/Patient Goals at Discharge (Final Review):   Goals and Risk Factor Review - 06/06/21 1749       Core Components/Risk Factors/Patient Goals Review   Personal Goals Review Weight Management/Obesity;Hypertension;Lipids    Review Toni is doing well exercise. Vital signs have been  stable. Antwoin will complete phase 2 cardiac rehab on 06/24/21.    Expected Outcomes Chidiebere will continue to participate in phase 2 cardiac rehab for exercise, nutrition and lifestyle modifications             ITP Comments:  ITP Comments     Row Name 04/26/21 0956 05/02/21 1127 05/10/21 0828 06/06/21 1749     ITP Comments Medical Director- Dr. Fransico Him, MD 30 Day ITP Review. Rohith started cardiac rehab on 05/02/21 and did well with exercise. 30 Day ITP Review. Zedekiah has good attendance and participation in phase 2 cardiac rehab. Zeeshan is off to a good start to exercise. 30 Day ITP Review. Kody has good  attendance and participation in phase 2 cardiac rehab.             Comments: See ITP Comments

## 2021-06-08 ENCOUNTER — Encounter (HOSPITAL_COMMUNITY)
Admission: RE | Admit: 2021-06-08 | Discharge: 2021-06-08 | Disposition: A | Discharge status: Home / Self Care | Payer: Medicare HMO | Source: Ambulatory Visit | Attending: Cardiology | Admitting: Cardiology

## 2021-06-08 DIAGNOSIS — Z955 Presence of coronary angioplasty implant and graft: Secondary | ICD-10-CM | POA: Diagnosis not present

## 2021-06-10 ENCOUNTER — Encounter (HOSPITAL_COMMUNITY)
Admission: RE | Admit: 2021-06-10 | Discharge: 2021-06-10 | Disposition: A | Discharge status: Home / Self Care | Payer: Medicare HMO | Source: Ambulatory Visit | Attending: Cardiology | Admitting: Cardiology

## 2021-06-10 DIAGNOSIS — Z955 Presence of coronary angioplasty implant and graft: Secondary | ICD-10-CM | POA: Diagnosis not present

## 2021-06-13 ENCOUNTER — Telehealth: Payer: Self-pay

## 2021-06-13 ENCOUNTER — Encounter (HOSPITAL_COMMUNITY): Payer: Medicare HMO

## 2021-06-13 ENCOUNTER — Encounter (HOSPITAL_COMMUNITY)
Admission: RE | Admit: 2021-06-13 | Discharge: 2021-06-13 | Disposition: A | Discharge status: Home / Self Care | Payer: Medicare HMO | Source: Ambulatory Visit | Attending: Cardiology | Admitting: Cardiology

## 2021-06-13 DIAGNOSIS — Z955 Presence of coronary angioplasty implant and graft: Secondary | ICD-10-CM | POA: Diagnosis not present

## 2021-06-13 NOTE — Telephone Encounter (Signed)
ERROR

## 2021-06-15 ENCOUNTER — Encounter (HOSPITAL_COMMUNITY)
Admission: RE | Admit: 2021-06-15 | Discharge: 2021-06-15 | Disposition: A | Discharge status: Home / Self Care | Payer: Medicare HMO | Source: Ambulatory Visit | Attending: Cardiology | Admitting: Cardiology

## 2021-06-15 ENCOUNTER — Encounter (HOSPITAL_COMMUNITY): Payer: Medicare HMO

## 2021-06-15 DIAGNOSIS — Z955 Presence of coronary angioplasty implant and graft: Secondary | ICD-10-CM | POA: Diagnosis not present

## 2021-06-17 ENCOUNTER — Encounter (HOSPITAL_COMMUNITY)
Admission: RE | Admit: 2021-06-17 | Discharge: 2021-06-17 | Disposition: A | Discharge status: Home / Self Care | Payer: Medicare HMO | Source: Ambulatory Visit | Attending: Cardiology | Admitting: Cardiology

## 2021-06-17 ENCOUNTER — Encounter (HOSPITAL_COMMUNITY): Payer: Medicare HMO

## 2021-06-17 DIAGNOSIS — Z955 Presence of coronary angioplasty implant and graft: Secondary | ICD-10-CM

## 2021-06-20 ENCOUNTER — Encounter (HOSPITAL_COMMUNITY)
Admission: RE | Admit: 2021-06-20 | Discharge: 2021-06-20 | Disposition: A | Discharge status: Home / Self Care | Payer: Medicare HMO | Source: Ambulatory Visit | Attending: Cardiology | Admitting: Cardiology

## 2021-06-20 ENCOUNTER — Encounter (HOSPITAL_COMMUNITY): Payer: Medicare HMO

## 2021-06-20 DIAGNOSIS — Z955 Presence of coronary angioplasty implant and graft: Secondary | ICD-10-CM

## 2021-06-22 ENCOUNTER — Encounter (HOSPITAL_COMMUNITY): Payer: Medicare HMO

## 2021-06-22 ENCOUNTER — Encounter (HOSPITAL_COMMUNITY)
Admission: RE | Admit: 2021-06-22 | Discharge: 2021-06-22 | Disposition: A | Discharge status: Home / Self Care | Payer: Medicare HMO | Source: Ambulatory Visit | Attending: Cardiology | Admitting: Cardiology

## 2021-06-22 DIAGNOSIS — Z955 Presence of coronary angioplasty implant and graft: Secondary | ICD-10-CM | POA: Diagnosis not present

## 2021-06-22 NOTE — Progress Notes (Signed)
Intermittent  resting systolic BP readings in the 90's. Patient asymptomatic. Gaynor is doing well with exercise at cardiac rehab which he will complete on Friday. Will fax exercise flow sheets to Dr. Edwin Dada  office for review.Barnet Pall, RN,BSN 06/22/2021 12:01 PM

## 2021-06-24 ENCOUNTER — Encounter (HOSPITAL_COMMUNITY)
Admission: RE | Admit: 2021-06-24 | Discharge: 2021-06-24 | Disposition: A | Discharge status: Home / Self Care | Payer: Medicare HMO | Source: Ambulatory Visit | Attending: Cardiology | Admitting: Cardiology

## 2021-06-24 ENCOUNTER — Encounter (HOSPITAL_COMMUNITY): Payer: Medicare HMO

## 2021-06-24 VITALS — BP 120/60 | Ht 69.25 in | Wt 187.2 lb

## 2021-06-24 DIAGNOSIS — Z955 Presence of coronary angioplasty implant and graft: Secondary | ICD-10-CM | POA: Diagnosis not present

## 2021-06-24 NOTE — Progress Notes (Addendum)
Discharge Progress Report  Patient Details  Name: Dakota Branch MRN: [id number] Date of Birth: 1950-11-02 Referring Provider:   Flowsheet Row CARDIAC REHAB PHASE II ORIENTATION from 04/26/2021 in [place]  Referring Provider Laurance Flatten, Gaston Islam, MD ([facility])  Radford Pax, Eber Hong, MD (coverage)]        Number of Visits: 22  Reason for Discharge:  Patient reached a stable level of exercise. Patient independent in their exercise. Patient has met program and personal goals.  Smoking History:  Social History   Tobacco Use  Smoking Status Never  Smokeless Tobacco Never    Diagnosis:  01/31/21 DES LAD  ADL UCSD:   Initial Exercise Prescription:  Initial Exercise Prescription - 04/26/21 1100       Date of Initial Exercise RX and Referring Provider   Date 04/26/21    Referring Provider Edwin Dada, MD ([facility])   Sueanne Margarita, MD (coverage)   Expected Discharge Date 06/24/21      Recumbant Bike   Level 2    Watts 40    Minutes 15    METs 3.48      NuStep   Level 3    SPM 85    Minutes 15    METs 3.2      Prescription Details   Frequency (times per week) 3    Duration Progress to 30 minutes of continuous aerobic without signs/symptoms of physical distress      Intensity   THRR 40-80% of Max Heartrate 61-122    Ratings of Perceived Exertion 11-13    Perceived Dyspnea 0-4      Progression   Progression Continue to progress workloads to maintain intensity without signs/symptoms of physical distress.      Resistance Training   Training Prescription Yes    Weight 3 lbs    Reps 10-15             Discharge Exercise Prescription (Final Exercise Prescription Changes):  Exercise Prescription Changes - 06/24/21 1031       Response to Exercise   Blood Pressure (Admit) 120/60    Blood Pressure (Exercise) 140/62    Blood Pressure (Exit) 102/70    Heart Rate (Admit) 64 bpm    Heart Rate (Exercise) 109 bpm    Heart Rate (Exit) 74  bpm    Rating of Perceived Exertion (Exercise) 12    Symptoms None    Comments Last cardiac rehab session.    Duration Progress to 30 minutes of  aerobic without signs/symptoms of physical distress    Intensity THRR unchanged      Progression   Progression Continue to progress workloads to maintain intensity without signs/symptoms of physical distress.    Average METs 3.3      Resistance Training   Training Prescription Yes    Weight 3 lbs    Reps 10-15    Time 10 Minutes      Interval Training   Interval Training No      Recumbant Bike   Level 3    Minutes 15    METs 2.9      NuStep   Level 5    SPM 85    Minutes 15    METs 3.7      Home Exercise Plan   Plans to continue exercise at Home (comment)   TM, walking   Frequency Add 2 additional days to program exercise sessions.    Initial Home Exercises Provided 05/16/21  Functional Capacity:  6 Minute Walk     Row Name 04/26/21 1011 06/17/21 1031       6 Minute Walk   Phase Initial Discharge    Distance 1727 feet 1933 feet    Distance % Change -- 11.93 %    Distance Feet Change -- 206 ft    Walk Time 6 minutes 6 minutes    # of Rest Breaks 0 0    MPH 3.27 3.66    METS 3.61 4.04    RPE 9 9    Perceived Dyspnea  0 0    VO2 Peak 12.64 14.16    Symptoms No No    Resting HR 64 bpm 68 bpm    Resting BP 122/60 122/72    Resting Oxygen Saturation  98 % --    Exercise Oxygen Saturation  during 6 min walk 99 % --    Max Ex. HR 82 bpm 96 bpm    Max Ex. BP 130/62 120/60    2 Minute Post BP 110/62 108/52             Psychological, QOL, Others - Outcomes: PHQ 2/9:    06/24/2021    2:28 PM 04/26/2021    4:18 PM 11/19/2020    9:21 AM 06/15/2020    3:46 PM 11/14/2019   11:09 AM  Depression screen PHQ 2/9  Decreased Interest 0 0 0 0 0  Down, Depressed, Hopeless 0 0 0 0 0  PHQ - 2 Score 0 0 0 0 0    Quality of Life:  Quality of Life - 06/22/21 1314       Quality of Life   Select  Quality of Life      Quality of Life Scores   Health/Function Pre 28.4 %    Health/Function Post 27.86 %    Health/Function % Change -1.9 %    Socioeconomic Pre 30 %    Socioeconomic Post 28.29 %    Socioeconomic % Change  -5.7 %    Psych/Spiritual Pre 24.86 %    Psych/Spiritual Post 29.14 %    Psych/Spiritual % Change 17.22 %    Family Pre 27.6 %    Family Post 26.4 %    Family % Change -4.35 %    GLOBAL Pre 27.82 %    GLOBAL Post 28 %    GLOBAL % Change 0.65 %             Personal Goals: Goals established at orientation with interventions provided to work toward goal.  Personal Goals and Risk Factors at Admission - 04/26/21 0957       Core Components/Risk Factors/Patient Goals on Admission   Hypertension Yes    Intervention Provide education on lifestyle modifcations including regular physical activity/exercise, weight management, moderate sodium restriction and increased consumption of fresh fruit, vegetables, and low fat dairy, alcohol moderation, and smoking cessation.;Monitor prescription use compliance.    Expected Outcomes Short Term: Continued assessment and intervention until BP is < 140/65m HG in hypertensive participants. < 130/839mHG in hypertensive participants with diabetes, heart failure or chronic kidney disease.;Long Term: Maintenance of blood pressure at goal levels.    Lipids Yes    Intervention Provide education and support for participant on nutrition & aerobic/resistive exercise along with prescribed medications to achieve LDL <7090mHDL >47m18m  Expected Outcomes Short Term: Participant states understanding of desired cholesterol values and is compliant with medications prescribed. Participant is following exercise prescription and nutrition guidelines.;Long  Term: Cholesterol controlled with medications as prescribed, with individualized exercise RX and with personalized nutrition plan. Value goals: LDL < 12m, HDL > 40 mg.    Personal Goal Other Yes     Personal Goal Adopt a healthy lifestyle.    Intervention Provide participant education on lifestyle modification including regular exercise and nutrition to help maintain a healthy lifestyle.    Expected Outcomes Participant is following exercise prescription and nutrition guidelines.              Personal Goals Discharge:  Goals and Risk Factor Review     Row Name 05/02/21 1128 05/10/21 0829 06/06/21 1749         Core Components/Risk Factors/Patient Goals Review   Personal Goals Review Weight Management/Obesity;Hypertension;Lipids Weight Management/Obesity;Hypertension;Lipids Weight Management/Obesity;Hypertension;Lipids     Review Wilbon did well with exercise at cardiac rehab on 05/02/21. Vital signs were stable Kaeleb is off to a good start to exercise. Vital signs have been  stable Alper is doing well exercise. Vital signs have been  stable. Clayson will complete phase 2 cardiac rehab on 06/24/21.     Expected Outcomes Tavares will continue to participate in phase 2 cardiac rehab for exercise, nutrition and lifestyle modifications Jevante will continue to participate in phase 2 cardiac rehab for exercise, nutrition and lifestyle modifications Layman will continue to participate in phase 2 cardiac rehab for exercise, nutrition and lifestyle modifications              Exercise Goals and Review:  Exercise Goals     Row Name 04/26/21 0958             Exercise Goals   Increase Physical Activity Yes       Intervention Provide advice, education, support and counseling about physical activity/exercise needs.;Develop an individualized exercise prescription for aerobic and resistive training based on initial evaluation findings, risk stratification, comorbidities and participant's personal goals.       Expected Outcomes Short Term: Attend rehab on a regular basis to increase amount of physical activity.;Long Term: Exercising regularly at least 3-5 days a week.;Long Term: Add in  home exercise to make exercise part of routine and to increase amount of physical activity.       Increase Strength and Stamina Yes       Intervention Provide advice, education, support and counseling about physical activity/exercise needs.;Develop an individualized exercise prescription for aerobic and resistive training based on initial evaluation findings, risk stratification, comorbidities and participant's personal goals.       Expected Outcomes Short Term: Increase workloads from initial exercise prescription for resistance, speed, and METs.;Short Term: Perform resistance training exercises routinely during rehab and add in resistance training at home;Long Term: Improve cardiorespiratory fitness, muscular endurance and strength as measured by increased METs and functional capacity (6MWT)       Able to understand and use rate of perceived exertion (RPE) scale Yes       Intervention Provide education and explanation on how to use RPE scale       Expected Outcomes Short Term: Able to use RPE daily in rehab to express subjective intensity level;Long Term:  Able to use RPE to guide intensity level when exercising independently       Knowledge and understanding of Target Heart Rate Range (THRR) Yes       Intervention Provide education and explanation of THRR including how the numbers were predicted and where they are located for reference       Expected Outcomes  Short Term: Able to state/look up THRR;Long Term: Able to use THRR to govern intensity when exercising independently;Short Term: Able to use daily as guideline for intensity in rehab       Able to check pulse independently Yes       Intervention Provide education and demonstration on how to check pulse in carotid and radial arteries.;Review the importance of being able to check your own pulse for safety during independent exercise       Expected Outcomes Short Term: Able to explain why pulse checking is important during independent exercise;Long  Term: Able to check pulse independently and accurately       Understanding of Exercise Prescription Yes       Intervention Provide education, explanation, and written materials on patient's individual exercise prescription       Expected Outcomes Short Term: Able to explain program exercise prescription;Long Term: Able to explain home exercise prescription to exercise independently                Exercise Goals Re-Evaluation:  Exercise Goals Re-Evaluation     Row Name 05/02/21 1132 05/16/21 1050 06/03/21 1018 06/06/21 1200 06/22/21 1049     Exercise Goal Re-Evaluation   Exercise Goals Review Increase Physical Activity;Able to understand and use rate of perceived exertion (RPE) scale Increase Physical Activity;Able to understand and use rate of perceived exertion (RPE) scale;Understanding of Exercise Prescription;Increase Strength and Stamina;Able to check pulse independently;Knowledge and understanding of Target Heart Rate Range (THRR) Increase Physical Activity;Able to understand and use rate of perceived exertion (RPE) scale;Understanding of Exercise Prescription;Increase Strength and Stamina;Able to check pulse independently;Knowledge and understanding of Target Heart Rate Range (THRR) Increase Physical Activity;Able to understand and use rate of perceived exertion (RPE) scale;Understanding of Exercise Prescription;Increase Strength and Stamina;Able to check pulse independently;Knowledge and understanding of Target Heart Rate Range (THRR) Increase Physical Activity;Able to understand and use rate of perceived exertion (RPE) scale;Understanding of Exercise Prescription;Increase Strength and Stamina;Able to check pulse independently;Knowledge and understanding of Target Heart Rate Range (THRR)   Comments Patient able to understand and use RPE scale appropriately. Reviewed exercise prescription with patient. Patient plans to walk and use his treadmill at home as his mode of home exercise. Patient  has a smart watch to monitor his pulse. Patient is walking on his treadmill 30 minutes and his elliptical 15 minutes daily in addition to exercise at cardiac rehab. Patient is interested in strengthening his core. Will print and review some core stretches that patient can do at home. Patient would like to increase his core strength. Oriented patient to core strengthening stretches that he can do at home. Patient very receptive to instructions given. Patient has been walking on his TM at home but has had some mild knee discomfort, which he doesn't have when walking outdoors. Advised patient to use his stationary bike and walk outdoors and rest from the TM since it gives him knee pain for now. Patient scheduled to complete cardiac rehab on Friday 06/24/21. Functional capacity increased 12% as measured by 6MWT and strength increased 47% as measured by grip strength test. [facility] at least 4 days/week. Patient also walks in the park and has a TM, stationary bike, and elliptical machine at home that he uses. Discussed stretching and resistance training and at least 2-3 days/week.   Expected Outcomes Progress workloads as tolerated to help increase cardiorespiratory fitness. Patient will walk outdoors or on his TM at home 30  minutes 2 days/week in addition to exercise at cardiac rehab to achieve 150 minutes of aerobic exercise/week Patient will continue current daily exercise routine and will add core stretches to help strengthen his core. Patient will add core strengthening stretches to exercise routine. Patient will continue exercise 30-45 minutes 5-7 days/week with a goal of achieving at least 150 minutes of aerobic exercise/week.            Nutrition & Weight - Outcomes:  Pre Biometrics - 06/24/21 1130       Pre Biometrics   Height 5' 9.25" (1.759 m)    Waist Circumference 37.25 inches    Hip Circumference 39.5 inches    Waist to Hip Ratio 0.94 %    BMI  (Calculated) 27.44    Triceps Skinfold 11 mm    % Body Fat 24.8 %    Grip Strength 50 kg    Flexibility 11.5 in    Single Leg Stand 30 seconds             Post Biometrics - 06/17/21 1130        Post  Biometrics   Waist Circumference 37.25 inches    Hip Circumference 39.5 inches    Waist to Hip Ratio 0.94 %    Triceps Skinfold 11 mm    Grip Strength 50 kg    Flexibility 11.5 in    Single Leg Stand 30 seconds             Nutrition:  Nutrition Therapy & Goals - 06/24/21 1206       Nutrition Therapy   Diet Heart Healthy Diet    Drug/Food Interactions Statins/Certain Fruits      Personal Nutrition Goals   Nutrition Goal Patient to describe the benefit of including fruits, vegetables, whole grains, low fat dairy, lean protein/plant protein as part of a heart healthy meal plan.    Personal Goal #2 Patient to identify and limit food sources of saturated fats, trans fats, refined carbohydrates, and sodium    Comments Patient graduates today. Goals in action. Patient continues to follow a high fiber, mediterranean style diet.      Intervention Plan   Intervention Prescribe, educate and counsel regarding individualized specific dietary modifications aiming towards targeted core components such as weight, hypertension, lipid management, diabetes, heart failure and other comorbidities.    Expected Outcomes Short Term Goal: Understand basic principles of dietary content, such as calories, fat, sodium, cholesterol and nutrients.;Long Term Goal: Adherence to prescribed nutrition plan.             Nutrition Discharge:  Nutrition Assessments - 06/23/21 0828       Rate Your Plate Scores   Post Score 71             Education Questionnaire Score:  Knowledge Questionnaire Score - 06/22/21 1309       Knowledge Questionnaire Score   Pre Score 17/24    Post Score 21/24             Goals reviewed with patient; copy given to patient.Pt graduates from   Intensive/Traditional cardiac rehab program on 06/24/21 with completion of   23 exercise and education sessions. Pt maintained good attendance and progressed nicely during their participation in rehab as evidenced by increased MET level.   Medication list reconciled. Repeat  PHQ score-  0.  Pt has made significant lifestyle changes and should be commended for their success. Marquies achieved their goals during cardiac rehab.   Pt plans  to continue exercise at home on his treadmill and walk in the park.Jodelle Gross increased his distance on his post exercise walk test by 206 we are proud of Mathews's progress!Harrell Gave RN BSN

## 2021-07-11 ENCOUNTER — Encounter: Payer: Self-pay | Admitting: Internal Medicine

## 2021-07-19 ENCOUNTER — Ambulatory Visit (INDEPENDENT_AMBULATORY_CARE_PROVIDER_SITE_OTHER): Payer: Medicare HMO | Admitting: Internal Medicine

## 2021-07-19 ENCOUNTER — Encounter: Payer: Self-pay | Admitting: Internal Medicine

## 2021-07-19 VITALS — BP 120/82 | HR 63 | Temp 97.4°F | Ht 69.0 in | Wt 191.0 lb

## 2021-07-19 DIAGNOSIS — M79642 Pain in left hand: Secondary | ICD-10-CM | POA: Diagnosis not present

## 2021-07-19 LAB — COMPREHENSIVE METABOLIC PANEL
ALT: 38 U/L (ref 0–53)
AST: 33 U/L (ref 0–37)
Albumin: 4.2 g/dL (ref 3.5–5.2)
Alkaline Phosphatase: 50 U/L (ref 39–117)
BUN: 12 mg/dL (ref 6–23)
CO2: 30 mEq/L (ref 19–32)
Calcium: 9.1 mg/dL (ref 8.4–10.5)
Chloride: 103 mEq/L (ref 96–112)
Creatinine, Ser: 0.81 mg/dL (ref 0.40–1.50)
GFR: 90.32 mL/min (ref >60.00)
Glucose, Bld: 110 mg/dL — ABNORMAL HIGH (ref 70–99)
Potassium: 4.2 mEq/L (ref 3.5–5.1)
Sodium: 139 mEq/L (ref 135–145)
Total Bilirubin: 1.8 mg/dL — ABNORMAL HIGH (ref 0.2–1.2)
Total Protein: 6.9 g/dL (ref 6.0–8.3)

## 2021-07-19 LAB — CBC
HCT: 39.1 % (ref 39.0–52.0)
Hemoglobin: 13.2 g/dL (ref 13.0–17.0)
MCHC: 33.7 g/dL (ref 30.0–36.0)
MCV: 89.4 fl (ref 78.0–100.0)
Platelets: 202 10*3/uL (ref 150.0–400.0)
RBC: 4.38 Mil/uL (ref 4.22–5.81)
RDW: 13.1 % (ref 11.5–15.5)
WBC: 6.5 10*3/uL (ref 4.0–10.5)

## 2021-07-19 LAB — LIPID PANEL
Cholesterol: 100 mg/dL (ref 0–200)
HDL: 40.4 mg/dL (ref >39.00)
LDL Cholesterol: 42 mg/dL (ref 0–99)
NonHDL: 59.4
Total CHOL/HDL Ratio: 2
Triglycerides: 86 mg/dL (ref 0.0–149.0)
VLDL: 17.2 mg/dL (ref 0.0–40.0)

## 2021-07-19 LAB — SEDIMENTATION RATE: Sed Rate: 5 mm/hr (ref 0–20)

## 2021-07-19 NOTE — Progress Notes (Signed)
Subjective:    Patient ID: Dakota Branch, male    DOB: 12-May-1950, 71 y.o.   MRN: [id number]  HPI Here due to swelling in some places  Notes swelling in entire left leg Worst around the knee Left hand is also swelling--hard to make a fist Goes back to May  Did see his nephew at [facility] in May Seemed to have mostly muscle complaints  Has had tick bites Some other insect bites--mosquitos, etc  Current Outpatient Medications on File Prior to Visit  Medication Sig Dispense Refill   amLODipine (NORVASC) 5 MG tablet TAKE 1 TABLET BY MOUTH EVERY DAY 90 tablet 2   aspirin EC 81 MG tablet Take 1 tablet (81 mg total) by mouth daily. Swallow whole. 90 tablet 3   clopidogrel (PLAVIX) 75 MG tablet Take 1 tablet (75 mg total) by mouth daily. 90 tablet 3   Iron Combinations (IRON COMPLEX PO) Take 1 capsule by mouth daily.     MAGNESIUM PO Take 1 capsule by mouth daily. magnesium ashwagandha     Melatonin 12 MG TABS Take 12 mg by mouth at bedtime as needed (sleep).     metoprolol succinate (TOPROL XL) 25 MG 24 hr tablet Take 1 tablet (25 mg total) by mouth daily. 30 tablet 11   Multiple Vitamins-Minerals (MULTIVITAMIN WITH MINERALS) tablet Take 1 tablet by mouth daily. 55+     nitroGLYCERIN (NITROSTAT) 0.4 MG SL tablet Place 1 tablet (0.4 mg total) under the tongue every 5 (five) minutes as needed for chest pain. 100 tablet 3   Omega-3 Fatty Acids ([place] WILD FISH OIL PO) Take 1,000 mg by mouth every other day.     Omega-3 Fatty Acids (OMEGA-3 FISH OIL PO) Take 1 capsule by mouth every other day.     rosuvastatin (CRESTOR) 40 MG tablet Take 1 tablet (40 mg total) by mouth daily. 90 tablet 4   sildenafil (REVATIO) 20 MG tablet Take 10 mg by mouth daily as needed (ED).     triamcinolone cream (KENALOG) 0.1 % Apply 1 application topically 2 (two) times daily as needed. 45 g 1   TURMERIC CURCUMIN PO Take 1 tablet by mouth daily.     No current facility-administered medications on file prior to  visit.    Allergies  Allergen Reactions   Lisinopril-Hydrochlorothiazide Itching    Past Medical History:  Diagnosis Date   Colon polyps    Coronary artery disease    ED (erectile dysfunction)    GERD (gastroesophageal reflux disease)    Hyperlipidemia    Hypertension    OSA (obstructive sleep apnea)     Past Surgical History:  Procedure Laterality Date   CARDIAC CATHETERIZATION     hepatitis ( prob A)  01/03/1976   LEFT HEART CATH AND CORONARY ANGIOGRAPHY N/A 01/06/2021   Procedure: LEFT HEART CATH AND CORONARY ANGIOGRAPHY;  Surgeon: Belva Crome, MD;  Location: [facility];  Service: Cardiovascular;  Laterality: N/A;    Family History  Problem Relation Age of Onset   Hydrocephalus Mother        shunt   Arthritis Mother    Diabetes Mother    Hypertension Father    Breast cancer Sister    Heart attack Maternal Grandmother    Diabetes Maternal Aunt    Colon cancer Neg Hx    Esophageal cancer Neg Hx    Rectal cancer Neg Hx    Stomach cancer Neg Hx     Social History   Socioeconomic  History   Marital status: Married    Spouse name: Not on file   Number of children: 4   Years of education: Not on file   Highest education level: Master's degree (e.g., MA, MS, MEng, MEd, MSW, MBA)  Occupational History   Occupation: owner    Comment: [employer]  Tobacco Use   Smoking status: Never    Passive exposure: Past   Smokeless tobacco: Never  Vaping Use   Vaping Use: Not on file  Substance and Sexual Activity   Alcohol use: Yes    Comment: rare   Drug use: No   Sexual activity: Yes  Other Topics Concern   Not on file  Social History Narrative   Pt is a Teacher, early years/pre.      Has living will    Wife is health care POA---alternate would be children   Would accept resuscitation   Would accept tube feeds   Social Determinants of Health   Financial Resource Strain: Not on file  Food Insecurity: Not on file  Transportation Needs: Not on  file  Physical Activity: Not on file  Stress: Not on file  Social Connections: Not on file  Intimate Partner Violence: Not on file   Review of Systems No illnesses No fever No GI symptoms--vomiting or diarrhea No rash    Objective:   Physical Exam Constitutional:      Appearance: Normal appearance.  Musculoskeletal:     Comments: ?slight left hand swelling. No true synovitis in either hand, elbows, knees  No true leg swelling Left knee without synovitis. ROM is normal  Lymphadenopathy:     Lower Body: No left inguinal adenopathy.  Skin:    Findings: No rash.  Neurological:     Mental Status: He is alert.     Comments: Mild decreased grip strength in left hand            Assessment & Plan:

## 2021-07-19 NOTE — Assessment & Plan Note (Signed)
And left leg No true synovitis Will check labs though Would be unusual statin side effect but possible  Discussed trying topical diclofenac on the painful areas---or tylenol Consider referral to hand specialist

## 2021-07-19 NOTE — Patient Instructions (Signed)
Please try over the counter diclofenac gel on your hand and pain areas in the leg--up to three times a day. You can also try tylenol [id number] mg up to three times a day.

## 2021-08-11 DIAGNOSIS — I10 Essential (primary) hypertension: Secondary | ICD-10-CM | POA: Diagnosis not present

## 2021-08-11 DIAGNOSIS — I2510 Atherosclerotic heart disease of native coronary artery without angina pectoris: Secondary | ICD-10-CM | POA: Diagnosis not present

## 2021-08-23 DIAGNOSIS — M25562 Pain in left knee: Secondary | ICD-10-CM | POA: Diagnosis not present

## 2021-08-23 DIAGNOSIS — M65332 Trigger finger, left middle finger: Secondary | ICD-10-CM | POA: Diagnosis not present

## 2021-08-23 DIAGNOSIS — I10 Essential (primary) hypertension: Secondary | ICD-10-CM | POA: Diagnosis not present

## 2021-08-23 DIAGNOSIS — M25561 Pain in right knee: Secondary | ICD-10-CM | POA: Diagnosis not present

## 2021-08-23 DIAGNOSIS — R29818 Other symptoms and signs involving the nervous system: Secondary | ICD-10-CM | POA: Diagnosis not present

## 2021-08-23 DIAGNOSIS — M65341 Trigger finger, right ring finger: Secondary | ICD-10-CM | POA: Diagnosis not present

## 2021-08-23 DIAGNOSIS — J40 Bronchitis, not specified as acute or chronic: Secondary | ICD-10-CM | POA: Diagnosis not present

## 2021-08-23 DIAGNOSIS — G8929 Other chronic pain: Secondary | ICD-10-CM | POA: Diagnosis not present

## 2021-08-23 DIAGNOSIS — Z1211 Encounter for screening for malignant neoplasm of colon: Secondary | ICD-10-CM | POA: Diagnosis not present

## 2021-08-23 DIAGNOSIS — M65331 Trigger finger, right middle finger: Secondary | ICD-10-CM | POA: Diagnosis not present

## 2021-08-23 DIAGNOSIS — R059 Cough, unspecified: Secondary | ICD-10-CM | POA: Diagnosis not present

## 2021-08-26 ENCOUNTER — Other Ambulatory Visit: Payer: Self-pay | Admitting: Internal Medicine

## 2021-09-08 NOTE — Progress Notes (Signed)
[facility] Quality Team Note  Name: Dakota Branch Date of Birth: 28-Dec-1950 MRN: [id number] Date: 09/08/2021  [facility] Quality Team has reviewed this patient's chart, please see recommendations below:  [facility] Quality Other; Pt has open quality gap for COL, needs Colorectal Cancer Screening to close this. Please address at next visit. Quality Coordinator will help get pt scheduled or send kit to him if he is agreeable.

## 2021-09-10 ENCOUNTER — Emergency Department (HOSPITAL_COMMUNITY)
Admission: EM | Admit: 2021-09-10 | Discharge: 2021-09-11 | Disposition: A | Discharge status: Home / Self Care | Payer: Medicare HMO | Attending: Emergency Medicine | Admitting: Emergency Medicine

## 2021-09-10 ENCOUNTER — Encounter (HOSPITAL_COMMUNITY): Payer: Self-pay | Admitting: Emergency Medicine

## 2021-09-10 ENCOUNTER — Emergency Department (HOSPITAL_COMMUNITY): Payer: Medicare HMO

## 2021-09-10 ENCOUNTER — Other Ambulatory Visit: Payer: Self-pay

## 2021-09-10 DIAGNOSIS — Z20822 Contact with and (suspected) exposure to covid-19: Secondary | ICD-10-CM | POA: Diagnosis not present

## 2021-09-10 DIAGNOSIS — M7989 Other specified soft tissue disorders: Secondary | ICD-10-CM | POA: Diagnosis not present

## 2021-09-10 DIAGNOSIS — R600 Localized edema: Secondary | ICD-10-CM | POA: Diagnosis not present

## 2021-09-10 DIAGNOSIS — Z79899 Other long term (current) drug therapy: Secondary | ICD-10-CM | POA: Diagnosis not present

## 2021-09-10 DIAGNOSIS — Z7982 Long term (current) use of aspirin: Secondary | ICD-10-CM | POA: Diagnosis not present

## 2021-09-10 DIAGNOSIS — R079 Chest pain, unspecified: Secondary | ICD-10-CM | POA: Diagnosis not present

## 2021-09-10 DIAGNOSIS — I80292 Phlebitis and thrombophlebitis of other deep vessels of left lower extremity: Secondary | ICD-10-CM | POA: Insufficient documentation

## 2021-09-10 DIAGNOSIS — R0602 Shortness of breath: Secondary | ICD-10-CM | POA: Diagnosis not present

## 2021-09-10 DIAGNOSIS — R059 Cough, unspecified: Secondary | ICD-10-CM | POA: Diagnosis not present

## 2021-09-10 DIAGNOSIS — I8002 Phlebitis and thrombophlebitis of superficial vessels of left lower extremity: Secondary | ICD-10-CM

## 2021-09-10 DIAGNOSIS — R062 Wheezing: Secondary | ICD-10-CM

## 2021-09-10 DIAGNOSIS — J9801 Acute bronchospasm: Secondary | ICD-10-CM | POA: Diagnosis not present

## 2021-09-10 LAB — BASIC METABOLIC PANEL
Anion gap: 10 (ref 5–15)
BUN: 17 mg/dL (ref 8–23)
CO2: 24 mmol/L (ref 22–32)
Calcium: 9.3 mg/dL (ref 8.9–10.3)
Chloride: 105 mmol/L (ref 98–111)
Creatinine, Ser: 0.89 mg/dL (ref 0.61–1.24)
GFR, Estimated: >60 mL/min (ref >60)
Glucose, Bld: 117 mg/dL — ABNORMAL HIGH (ref 70–99)
Potassium: 4 mmol/L (ref 3.5–5.1)
Sodium: 139 mmol/L (ref 135–145)

## 2021-09-10 LAB — CBC WITH DIFFERENTIAL/PLATELET
Abs Immature Granulocytes: 0.03 10*3/uL (ref 0.00–0.07)
Basophils Absolute: 0 10*3/uL (ref 0.0–0.1)
Basophils Relative: 0 %
Eosinophils Absolute: 0 10*3/uL (ref 0.0–0.5)
Eosinophils Relative: 0 %
HCT: 39.8 % (ref 39.0–52.0)
Hemoglobin: 13.6 g/dL (ref 13.0–17.0)
Immature Granulocytes: 0 %
Lymphocytes Relative: 11 %
Lymphs Abs: 0.9 10*3/uL (ref 0.7–4.0)
MCH: 30.4 pg (ref 26.0–34.0)
MCHC: 34.2 g/dL (ref 30.0–36.0)
MCV: 89 fL (ref 80.0–100.0)
Monocytes Absolute: 0.4 10*3/uL (ref 0.1–1.0)
Monocytes Relative: 5 %
Neutro Abs: 6.6 10*3/uL (ref 1.7–7.7)
Neutrophils Relative %: 84 %
Platelets: 269 10*3/uL (ref 150–400)
RBC: 4.47 MIL/uL (ref 4.22–5.81)
RDW: 12.3 % (ref 11.5–15.5)
WBC: 7.9 10*3/uL (ref 4.0–10.5)
nRBC: 0 % (ref 0.0–0.2)

## 2021-09-10 LAB — TROPONIN I (HIGH SENSITIVITY): Troponin I (High Sensitivity): 4 ng/L (ref <18)

## 2021-09-10 LAB — BRAIN NATRIURETIC PEPTIDE: B Natriuretic Peptide: 91.4 pg/mL (ref 0.0–100.0)

## 2021-09-10 NOTE — ED Triage Notes (Signed)
Patient reports persistent productive cough with SOB for > 1 month unrelieved by prescription medication , denies fever or chills .

## 2021-09-10 NOTE — ED Provider Triage Note (Signed)
Emergency Medicine Provider Triage Evaluation Note  Dakota Branch , a 71 y.o. male  was evaluated in triage.  Pt complains of chest pain, cough, shortness of breath x1 month.  Denies any recent travel or surgeries, comes and goes.  Does not radiate to back..  Review of Systems  Per HPI  Physical Exam  BP (!) 150/62 (BP Location: Left Arm)   Pulse 71   Temp 98.2 F (36.8 C) (Oral)   Resp 16   SpO2 97%  Gen:   Awake, no distress   Resp:  Normal effort  MSK:   Moves extremities without difficulty  Other:  Trace lower extremity edema.  S1-S2, lungs are clear to auscultation bilaterally.  Uvula is midline, no erythema to the posterior oropharynx  Medical Decision Making  Medically screening exam initiated at 9:13 PM.  Appropriate orders placed.  Dakota Branch was informed that the remainder of the evaluation will be completed by another provider, this initial triage assessment does not replace that evaluation, and the importance of remaining in the ED until their evaluation is complete.     Sherrill Raring, PA-C 09/10/21 2114

## 2021-09-11 ENCOUNTER — Emergency Department (HOSPITAL_BASED_OUTPATIENT_CLINIC_OR_DEPARTMENT_OTHER): Payer: Medicare HMO

## 2021-09-11 ENCOUNTER — Emergency Department (HOSPITAL_COMMUNITY): Payer: Medicare HMO

## 2021-09-11 ENCOUNTER — Encounter (HOSPITAL_COMMUNITY): Payer: Self-pay

## 2021-09-11 DIAGNOSIS — R059 Cough, unspecified: Secondary | ICD-10-CM | POA: Diagnosis not present

## 2021-09-11 DIAGNOSIS — M7989 Other specified soft tissue disorders: Secondary | ICD-10-CM | POA: Diagnosis not present

## 2021-09-11 DIAGNOSIS — R079 Chest pain, unspecified: Secondary | ICD-10-CM | POA: Diagnosis not present

## 2021-09-11 DIAGNOSIS — M79609 Pain in unspecified limb: Secondary | ICD-10-CM

## 2021-09-11 DIAGNOSIS — R0602 Shortness of breath: Secondary | ICD-10-CM | POA: Diagnosis not present

## 2021-09-11 LAB — SARS CORONAVIRUS 2 BY RT PCR: SARS Coronavirus 2 by RT PCR: NEGATIVE

## 2021-09-11 LAB — TROPONIN I (HIGH SENSITIVITY): Troponin I (High Sensitivity): 4 ng/L (ref <18)

## 2021-09-11 MED ORDER — ALBUTEROL SULFATE HFA 108 (90 BASE) MCG/ACT IN AERS
2.0000 | INHALATION_SPRAY | Freq: Once | RESPIRATORY_TRACT | Status: AC
  Administered 2021-09-11: 2 via RESPIRATORY_TRACT
  Filled 2021-09-11: qty 6.7

## 2021-09-11 MED ORDER — IOHEXOL 350 MG/ML SOLN
100.0000 mL | Freq: Once | INTRAVENOUS | Status: AC | PRN
  Administered 2021-09-11: 100 mL via INTRAVENOUS

## 2021-09-11 NOTE — ED Provider Notes (Signed)
[facility] EMERGENCY DEPARTMENT Provider Note   CSN: [id number] Arrival date & time: 09/10/21  2048     History  Chief Complaint  Patient presents with   SOB Albin Fischer    Tyberius Stclair is a 71 y.o. male.  The history is provided by the patient and medical records. No language interpreter was used.  Shortness of Breath Severity:  Moderate Onset quality:  Gradual Duration:  1 month Timing:  Constant Progression:  Waxing and waning Chronicity:  New Context: URI   Relieved by:  Nothing Worsened by:  Deep breathing Ineffective treatments:  None tried Associated symptoms: chest pain (tightness), cough, sputum production and wheezing (faint)   Associated symptoms: no abdominal pain, no diaphoresis, no fever, no headaches, no hemoptysis, no neck pain, no rash and no vomiting   Risk factors: no hx of PE/DVT        Home Medications Prior to Admission medications   Medication Sig Start Date End Date Taking? Authorizing Provider  amLODipine (NORVASC) 5 MG tablet TAKE 1 TABLET BY MOUTH EVERY DAY 04/11/21   Ria Bush, MD  aspirin EC 81 MG tablet Take 1 tablet (81 mg total) by mouth daily. Swallow whole. 12/29/20   Minus Breeding, MD  clopidogrel (PLAVIX) 75 MG tablet Take 1 tablet (75 mg total) by mouth daily. 01/06/21 01/06/22  Belva Crome, MD  Iron Combinations (IRON COMPLEX PO) Take 1 capsule by mouth daily.    [provider]  MAGNESIUM PO Take 1 capsule by mouth daily. magnesium ashwagandha    [provider]  Melatonin 12 MG TABS Take 12 mg by mouth at bedtime as needed (sleep).    [provider]  metoprolol succinate (TOPROL XL) 25 MG 24 hr tablet Take 1 tablet (25 mg total) by mouth daily. 01/06/21 01/06/22  Belva Crome, MD  Multiple Vitamins-Minerals (MULTIVITAMIN WITH MINERALS) tablet Take 1 tablet by mouth daily. 55+    [provider]  nitroGLYCERIN (NITROSTAT) 0.4 MG SL tablet Place 1 tablet (0.4 mg total)  under the tongue every 5 (five) minutes as needed for chest pain. 01/06/21 01/06/22  Belva Crome, MD  Omega-3 Fatty Acids ([place] WILD FISH OIL PO) Take 1,000 mg by mouth every other day.    [provider]  Omega-3 Fatty Acids (OMEGA-3 FISH OIL PO) Take 1 capsule by mouth every other day.    [provider]  rosuvastatin (CRESTOR) 40 MG tablet Take 1 tablet (40 mg total) by mouth daily. 01/06/21   Belva Crome, MD  sildenafil (REVATIO) 20 MG tablet Take 10 mg by mouth daily as needed (ED).    [provider]  triamcinolone cream (KENALOG) 0.1 % Apply 1 application topically 2 (two) times daily as needed. 07/12/20   Venia Carbon, MD  TURMERIC CURCUMIN PO Take 1 tablet by mouth daily.    [provider]      Allergies    Lisinopril-hydrochlorothiazide    Review of Systems   Review of Systems  Constitutional:  Positive for fatigue. Negative for chills, diaphoresis and fever.  HENT:  Negative for congestion.   Eyes:  Negative for visual disturbance.  Respiratory:  Positive for cough, sputum production, chest tightness, shortness of breath and wheezing (faint). Negative for hemoptysis and stridor.   Cardiovascular:  Positive for chest pain (tightness). Negative for palpitations and leg swelling (recently but now resolved).  Gastrointestinal:  Negative for abdominal pain, constipation, diarrhea, nausea and vomiting.  Genitourinary:  Negative  for dysuria.  Musculoskeletal:  Negative for back pain, neck pain and neck stiffness.  Skin:  Negative for rash and wound.  Neurological:  Negative for light-headedness and headaches.  Psychiatric/Behavioral:  Negative for agitation.   All other systems reviewed and are negative.   Physical Exam Updated Vital Signs BP (!) 113/53 (BP Location: Right Arm)   Pulse (!) 53   Temp 97.8 F (36.6 C) (Oral)   Resp 16   SpO2 96%  Physical Exam Vitals and nursing note reviewed.  Constitutional:      General: He is not  in acute distress.    Appearance: He is well-developed. He is not ill-appearing, toxic-appearing or diaphoretic.  HENT:     Head: Normocephalic and atraumatic.     Nose: Nose normal.     Mouth/Throat:     Mouth: Mucous membranes are moist.  Eyes:     Conjunctiva/sclera: Conjunctivae normal.  Cardiovascular:     Rate and Rhythm: Normal rate and regular rhythm.     Heart sounds: No murmur heard. Pulmonary:     Effort: Pulmonary effort is normal. No respiratory distress.     Breath sounds: Wheezing present. No rhonchi or rales.  Chest:     Chest wall: No tenderness.  Abdominal:     Palpations: Abdomen is soft.     Tenderness: There is no abdominal tenderness. There is no right CVA tenderness, left CVA tenderness, guarding or rebound.  Musculoskeletal:        General: No swelling or tenderness.     Cervical back: Neck supple. No tenderness.     Right lower leg: No edema.     Left lower leg: No edema.  Skin:    General: Skin is warm and dry.     Capillary Refill: Capillary refill takes less than 2 seconds.     Findings: No erythema or rash.  Neurological:     General: No focal deficit present.     Mental Status: He is alert.  Psychiatric:        Mood and Affect: Mood normal.     ED Results / Procedures / Treatments   Labs (all labs ordered are listed, but only abnormal results are displayed) Labs Reviewed  BASIC METABOLIC PANEL - Abnormal; Notable for the following components:      Result Value   Glucose, Bld 117 (*)    All other components within normal limits  SARS CORONAVIRUS 2 BY RT PCR  BRAIN NATRIURETIC PEPTIDE  CBC WITH DIFFERENTIAL/PLATELET  TROPONIN I (HIGH SENSITIVITY)  TROPONIN I (HIGH SENSITIVITY)    EKG EKG Interpretation  Date/Time:  Saturday September 10 2021 21:10:44 EDT Ventricular Rate:  70 PR Interval:  138 QRS Duration: 88 QT Interval:  372 QTC Calculation: 401 R Axis:   68 Text Interpretation: Normal sinus rhythm Normal ECG No previous  ECGs available no prior ECG for comparison. No STEMI Confirmed by Antony Blackbird [id number]) on 09/11/2021 11:20:14 AM  Radiology VAS [place] LOWER EXTREMITY VENOUS (DVT) (ONLY [facility] & [facility])  Result Date: 09/11/2021  Lower Venous DVT Study Patient Name:  MATTIS FEATHERLY  Date of Exam:   09/11/2021 Medical Rec #: [id number]         Accession #:    [id number] Date of Birth: 22-Aug-1950         Patient Gender: M Patient Age:   89 years Exam Location:  [facility] Procedure:      VAS [place] LOWER EXTREMITY VENOUS (DVT) Referring Phys: Marda Stalker --------------------------------------------------------------------------------  Indications: Proximal thigh pain and pain behind the knee.  Comparison Study: No prior study Performing Technologist: Sharion Dove RVS  Examination Guidelines: A complete evaluation includes B-mode imaging, spectral Doppler, color Doppler, and power Doppler as needed of all accessible portions of each vessel. Bilateral testing is considered an integral part of a complete examination. Limited examinations for reoccurring indications may be performed as noted. The reflux portion of the exam is performed with the patient in reverse Trendelenburg.  +-----+---------------+---------+-----------+----------+--------------+ RIGHTCompressibilityPhasicitySpontaneityPropertiesThrombus Aging +-----+---------------+---------+-----------+----------+--------------+ CFV  Full           Yes      Yes                                 +-----+---------------+---------+-----------+----------+--------------+   +---------+---------------+---------+-----------+----------+-----------------+ LEFT     CompressibilityPhasicitySpontaneityPropertiesThrombus Aging    +---------+---------------+---------+-----------+----------+-----------------+ CFV      Full           Yes      Yes                                    +---------+---------------+---------+-----------+----------+-----------------+ SFJ       Full                                                           +---------+---------------+---------+-----------+----------+-----------------+ FV Prox  Full                                                           +---------+---------------+---------+-----------+----------+-----------------+ FV Mid   Full                                                           +---------+---------------+---------+-----------+----------+-----------------+ FV DistalFull                                                           +---------+---------------+---------+-----------+----------+-----------------+ PFV      Full                                                           +---------+---------------+---------+-----------+----------+-----------------+ POP      Full           Yes      Yes                                    +---------+---------------+---------+-----------+----------+-----------------+ PTV      Full                                                           +---------+---------------+---------+-----------+----------+-----------------+  PERO     Full                                                           +---------+---------------+---------+-----------+----------+-----------------+ Gastroc  Full                                                           +---------+---------------+---------+-----------+----------+-----------------+ GSV      Partial                                      Age Indeterminate +---------+---------------+---------+-----------+----------+-----------------+ SSV      None                                         Acute             +---------+---------------+---------+-----------+----------+-----------------+   Summary: RIGHT: - No evidence of deep vein thrombosis in the lower extremity. No indirect evidence of obstruction proximal to the inguinal ligament.  LEFT: - Findings consistent with acute superficial vein thrombosis  involving the left small saphenous vein. - Findings consistent with age indeterminate superficial vein thrombosis involving the left great saphenous vein. - There is no evidence of deep vein thrombosis in the lower extremity.  There is an enlarged lymph node measuring 2.89cm noted in proximal thigh at site of patient reported swelling.  *See table(s) above for measurements and observations.    Preliminary    CT Angio Chest PE W and/or Wo Contrast  Result Date: 09/11/2021 CLINICAL DATA:  Pleuritic chest pain and shortness of breath. Cough. Left leg swelling. High probability for pulmonary embolism. EXAM: CT ANGIOGRAPHY CHEST WITH CONTRAST TECHNIQUE: Multidetector CT imaging of the chest was performed using the standard protocol during bolus administration of intravenous contrast. Multiplanar CT image reconstructions and MIPs were obtained to evaluate the vascular anatomy. RADIATION DOSE REDUCTION: This exam was performed according to the departmental dose-optimization program which includes automated exposure control, adjustment of the mA and/or kV according to patient size and/or use of iterative reconstruction technique. CONTRAST:  145m OMNIPAQUE IOHEXOL 350 MG/ML SOLN COMPARISON:  None Available. FINDINGS: Cardiovascular: Satisfactory opacification of pulmonary arteries noted, and no pulmonary emboli identified. No evidence of thoracic aortic dissection or aneurysm. Aortic and coronary atherosclerotic calcification incidentally noted. Mediastinum/Nodes: No masses or pathologically enlarged lymph nodes identified. Lungs/Pleura: No pulmonary mass, infiltrate, or effusion. Upper abdomen: No acute findings. Musculoskeletal: No suspicious bone lesions identified. Review of the MIP images confirms the above findings. IMPRESSION: No evidence of pulmonary embolism or other active disease. Aortic Atherosclerosis (ICD10-I70.0). Electronically Signed   By: JMarlaine HindM.D.   On: 09/11/2021 13:57   DG Chest 2  View  Result Date: 09/10/2021 CLINICAL DATA:  Shortness of breath, chest pain and coughing. EXAM: CHEST - 2 VIEW COMPARISON:  None Available. FINDINGS: Heart size and mediastinal contours are within normal limits. There is mild calcification in the aortic arch and a left-sided coronary artery stent. The  lungs are clear. There are small eventrations of both hemidiaphragms. No acute osseous abnormality is seen, with thoracic spondylosis and slight dextroscoliosis noted. IMPRESSION: No active cardiopulmonary disease. Aortic atherosclerosis. Coronary artery stent on the left. Electronically Signed   By: Telford Nab M.D.   On: 09/10/2021 21:46    Procedures Procedures    Medications Ordered in ED Medications  albuterol (VENTOLIN HFA) 108 (90 Base) MCG/ACT inhaler 2 puff (has no administration in time range)  iohexol (OMNIPAQUE) 350 MG/ML injection 100 mL (100 mLs Intravenous Contrast Given 09/11/21 1348)    ED Course/ Medical Decision Making/ A&P                           Medical Decision Making Amount and/or Complexity of Data Reviewed Radiology: ordered.  Risk Prescription drug management.    Oneal Lyerly is a 71 y.o. male with a past medical history significant for CAD status post PCI, GERD, hypertension, hyperlipidemia, MGUS, previous report of asthma, and sleep apnea who presents with 1 month of persistent shortness of breath, chest tightness, productive cough, and fatigue.  He does report he also had left leg pain and swelling a month and a half ago that has improved.  He denies any history of DVT or PE.  He reports no trauma.  He reports he has had no fevers or chills but continues to have the short of breath and cough.  He feels like a tightness when he takes deep breath.  Denies any other chest pain or palpitations.  Denies nausea, vomiting, constipation, diarrhea, or urinary changes.  On exam, lungs did have some faint wheezing.  Chest was nontender.  Abdomen nontender.  No  murmur appreciated.  No tenderness on his back.  Legs were nontender and he had intact pulses.  EKG shows no STEMI.  Patient had work-up starting in triage that has been reassuring thus far.  Chest x-ray did not show pneumonia and troponin was negative x2.  BNP was less than 100 and his CBC was reassuring.  Metabolic panel does not show kidney injury or significant electrolyte abnormality.  Due to the coughing, COVID test will be obtained.  Given his history of MGUS, this recent unilateral leg pain and swelling, and this pleuritic shortness of breath and discomfort with tightness, I do feel need to rule out pulmonary embolism as well as get a better look at his lungs.  We will get a CT PE study and will get an ultrasound of his leg.  We will check for COVID.  Given the wheezing, if work-up is reassuring without acute abnormality, will likely give him some albuterol and a burst of steroids as this could be a recurrent reactive airway disease exacerbation.  Anticipate reassessment after work-up.  2:49 PM Vascular technicians report the patient has superficial thrombosis but no DVT.  Work-up continues to return.  COVID-negative.  CT PE study shows no acute abnormality or pulmonary embolism.  Clinical aspect he has a reactive airway disease exacerbation.  Will discuss with patient and likely give albuterol and steroids and burst of steroids for discharge home to follow-up with PCP.  Vital signs at discharge had no hypoxia, no tachycardia, afebrile, and well-appearing with improved breathing.        Final Clinical Impression(s) / ED Diagnoses Final diagnoses:  Shortness of breath  Wheezing  Thrombophlebitis of superficial veins of left lower extremity    Rx / DC Orders ED Discharge Orders  None       Clinical Impression: 1. Shortness of breath   2. Wheezing   3. Thrombophlebitis of superficial veins of left lower extremity     Disposition: Discharge  Condition: Good  I  have discussed the results, Dx and Tx plan with the pt(& family if present). He/she/they expressed understanding and agree(s) with the plan. Discharge instructions discussed at great length. Strict return precautions discussed and pt &/or family have verbalized understanding of the instructions. No further questions at time of discharge.    New Prescriptions   No medications on file    Follow Up: Princess Perna, MD [place] [id number] [place] [address] [id number] mc [place]       Jnaya Butrick, Gwenyth Allegra, MD 09/11/21 1655

## 2021-09-11 NOTE — Progress Notes (Signed)
VASCULAR LAB    Left lower extremity venous duplex has been performed.  See CV proc for preliminary results.   Habiba Treloar, RVT 09/11/2021, 2:42 PM

## 2021-09-11 NOTE — Discharge Instructions (Addendum)
Your history, exam, work-up today did not reveal evidence of pulmonary embolism, pneumonia, or other emergent etiology that would require admission at this time.  With your wheezing on exam, I do suspect there is a degree of reactive airway disease or asthma that is causing your persistent shortness of breath.  Please use the steroid you are prescribed, the inhaled medication you prescribed, and please use the new inhaler albuterol to help with your breathing.  Although the x-ray and CT did not show pneumonia, your PCP was going to treat you with more doxycycline for possible bronchitis, please discuss with them if you want to take it or not.  Your ultrasound of the leg did not show DVT but did show some mild superficial thrombophlebitis which is likely causing your leg discomfort.  Please follow-up with your primary doctor and rest and stay hydrated.  If any symptoms change or worsen acutely, please return to the nearest emergency department.

## 2021-09-14 ENCOUNTER — Telehealth: Payer: Self-pay

## 2021-09-14 NOTE — Chronic Care Management (AMB) (Addendum)
    Chronic Care Management Pharmacy Assistant   Name: Carroll Lingelbach  MRN: [id number] DOB: October 14, 1950  Reason for Encounter: Hospital Follow up-Non CCM   Medications: Outpatient Encounter Medications as of 09/14/2021  Medication Sig Note   amLODipine (NORVASC) 5 MG tablet TAKE 1 TABLET BY MOUTH EVERY DAY    aspirin EC 81 MG tablet Take 1 tablet (81 mg total) by mouth daily. Swallow whole.    clopidogrel (PLAVIX) 75 MG tablet Take 1 tablet (75 mg total) by mouth daily.    Iron Combinations (IRON COMPLEX PO) Take 1 capsule by mouth daily.    MAGNESIUM PO Take 1 capsule by mouth daily. magnesium ashwagandha    Melatonin 12 MG TABS Take 12 mg by mouth at bedtime as needed (sleep).    metoprolol succinate (TOPROL XL) 25 MG 24 hr tablet Take 1 tablet (25 mg total) by mouth daily.    Multiple Vitamins-Minerals (MULTIVITAMIN WITH MINERALS) tablet Take 1 tablet by mouth daily. 55+    nitroGLYCERIN (NITROSTAT) 0.4 MG SL tablet Place 1 tablet (0.4 mg total) under the tongue every 5 (five) minutes as needed for chest pain.    Omega-3 Fatty Acids ([place] WILD FISH OIL PO) Take 1,000 mg by mouth every other day. 04/25/2021: Pt has 2 different fish oil products that he alternates every other day with   Omega-3 Fatty Acids (OMEGA-3 FISH OIL PO) Take 1 capsule by mouth every other day. 04/25/2021: Pt has 2 different fish oil products that he alternates every other day with   rosuvastatin (CRESTOR) 40 MG tablet Take 1 tablet (40 mg total) by mouth daily.    sildenafil (REVATIO) 20 MG tablet Take 10 mg by mouth daily as needed (ED).    triamcinolone cream (KENALOG) 0.1 % Apply 1 application topically 2 (two) times daily as needed.    TURMERIC CURCUMIN PO Take 1 tablet by mouth daily.    No facility-administered encounter medications on file as of 09/14/2021.      Reviewed hospital notes for details of recent visit. Patient has been contacted by Transitions of Care team: No  Admitted to the ED on  09/10/21. Discharge date was 09/11/21.  Discharged from [facility] ED. Discharge diagnosis (Principal Problem): Shortness of Breath  Patient was discharged to Home  Brief summary of hospital course: Chest x-ray did not show pneumonia and troponin was negative x2.  BNP was less than 100 and his CBC was reassuring.  Metabolic panel does not show kidney injury or significant electrolyte abnormality.  Due to the coughing, COVID test will be obtained.  Vascular technicians report the patient has superficial thrombosis but no DVT.  Work-up continues to return.  COVID-negative.  CT PE study shows no acute abnormality or pulmonary embolism.  Clinical aspect he has a reactive airway disease exacerbation.  Will discuss with patient and likely give albuterol and steroids and burst of steroids for discharge home to follow-up with PCP.   Medications that remain the same after Hospital Discharge:??  -All other medications will remain the same.    Next CCM appt: none  Other upcoming appts: PCP appointment on 11/21/21  Charlene Brooke, PharmD notified and will determine if action is needed.   Avel Sensor, [place]  [phone]

## 2021-09-21 DIAGNOSIS — Z86010 Personal history of colonic polyps: Secondary | ICD-10-CM | POA: Diagnosis not present

## 2021-09-21 DIAGNOSIS — I2510 Atherosclerotic heart disease of native coronary artery without angina pectoris: Secondary | ICD-10-CM | POA: Diagnosis not present

## 2021-09-21 DIAGNOSIS — I10 Essential (primary) hypertension: Secondary | ICD-10-CM | POA: Diagnosis not present

## 2021-09-21 IMAGING — US US EXTREM LOW*L* LIMITED
1 series · 11 of 11 positions shown · non-contrast
Comparison: None.

CLINICAL DATA: Palpable lump along the thigh

EXAM:
ULTRASOUND left LOWER EXTREMITY LIMITED
TECHNIQUE: Ultrasound examination of the lower extremity soft tissues was
performed in the area of clinical concern.

[Series 1: us extrem low*left* limited · 0.06mm/px · 11 acquisitions, 11 frames shown]
[im 1/11]
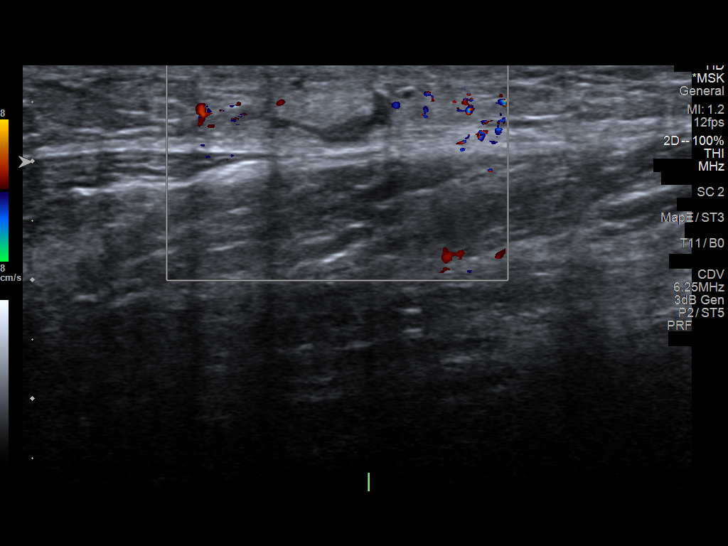
[im 2/11]
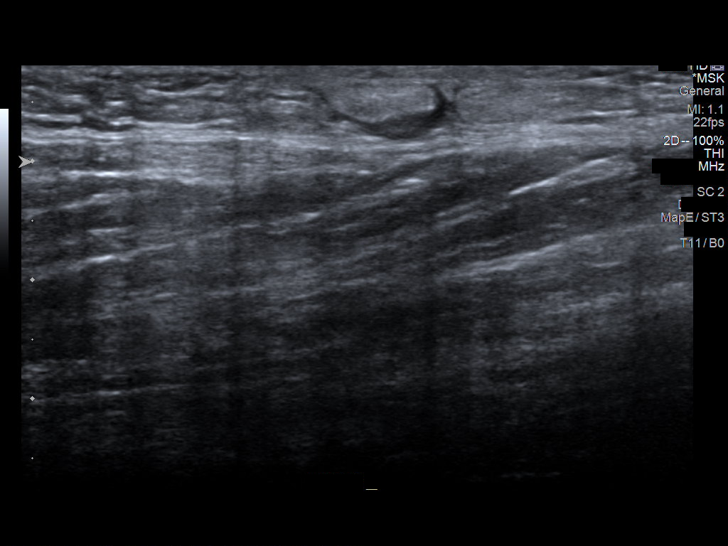
[im 3/11]
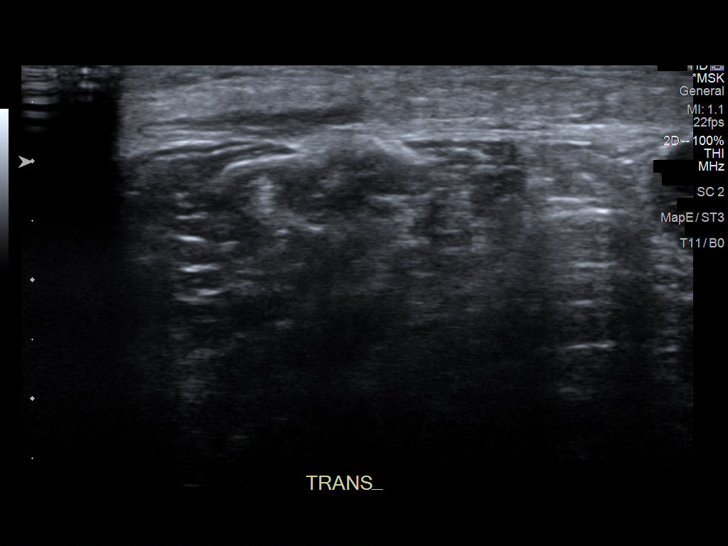
[im 4/11]
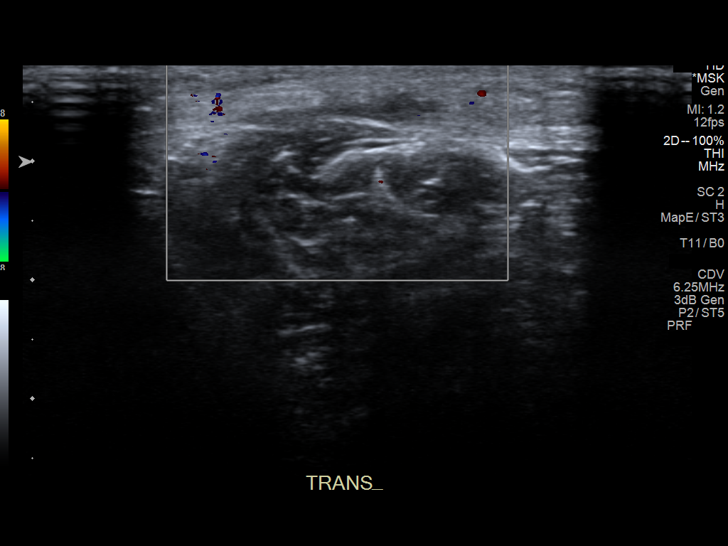
[im 5/11]
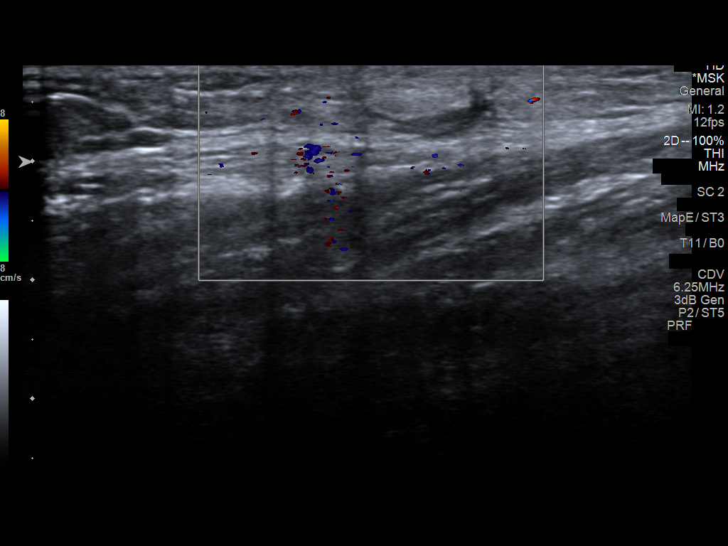
[im 6/11]
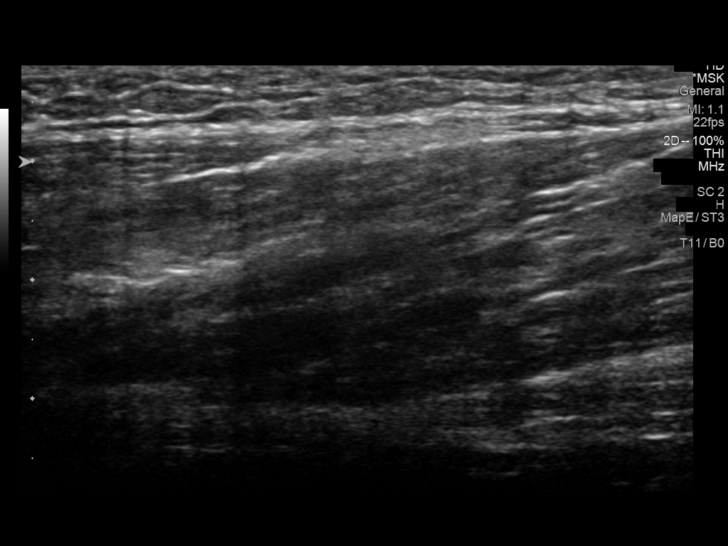
[im 7/11]
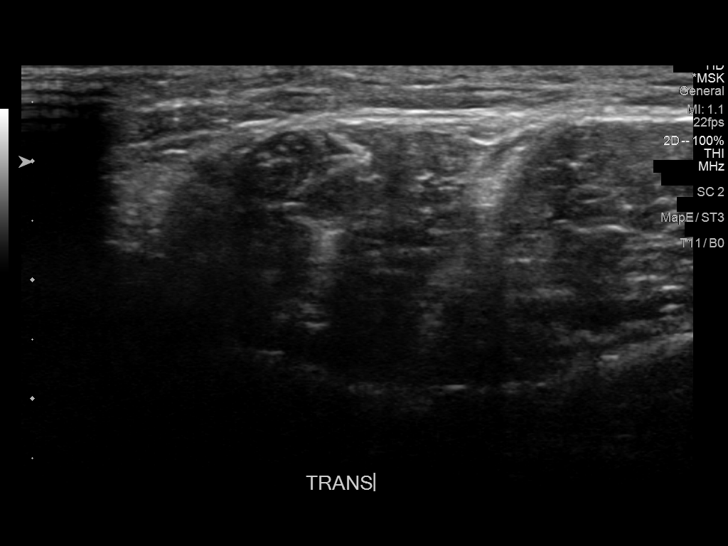
[im 8/11]
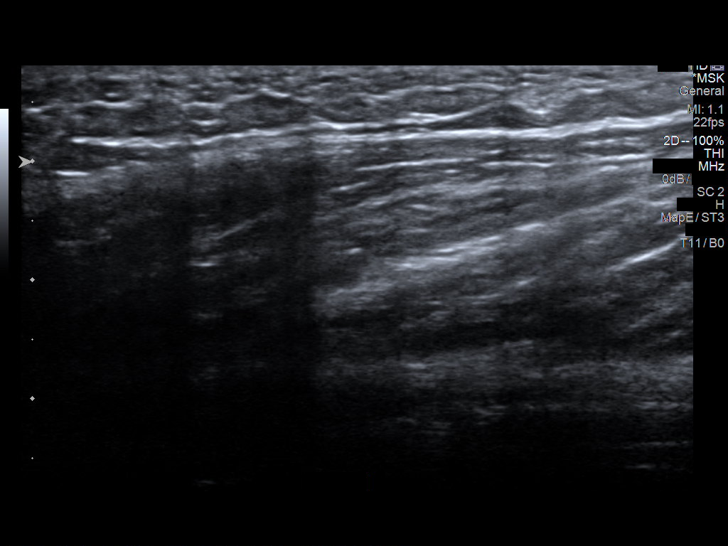
[im 9/11]
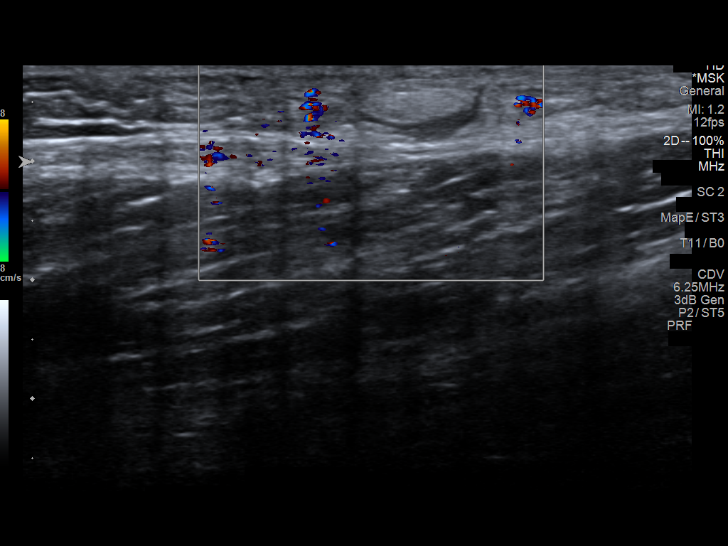
[im 10/11]
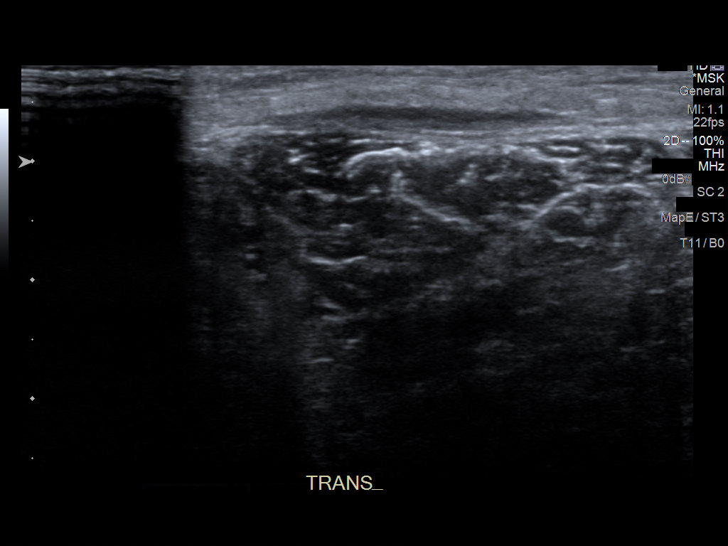
[im 11/11]
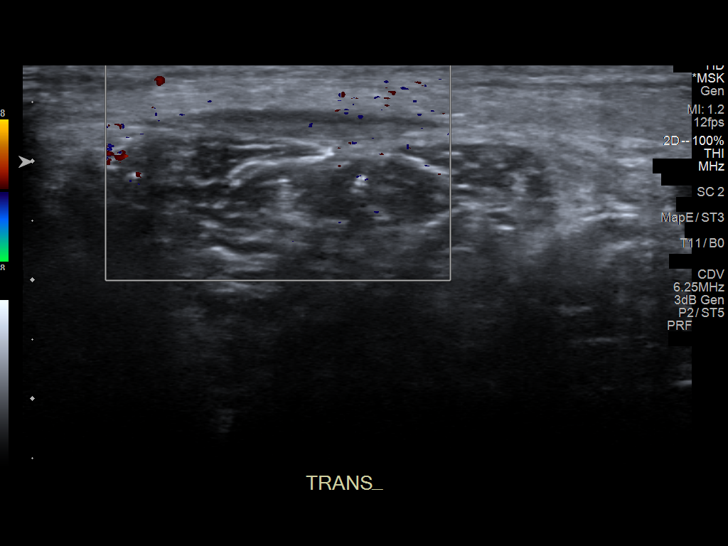

[11 of 11 positions shown; findings below may reference images not displayed]

FINDINGS: Equivocal trace infiltrative edema in the deep subcutaneous tissues
in the region of concern, without a well-defined mass.
IMPRESSION: 1. Equivocal localized deep subcutaneous edema in the region of
concern, but without a well-defined mass lesion.

## 2021-10-05 DIAGNOSIS — D2272 Melanocytic nevi of left lower limb, including hip: Secondary | ICD-10-CM | POA: Diagnosis not present

## 2021-10-05 DIAGNOSIS — D225 Melanocytic nevi of trunk: Secondary | ICD-10-CM | POA: Diagnosis not present

## 2021-10-05 DIAGNOSIS — Z1283 Encounter for screening for malignant neoplasm of skin: Secondary | ICD-10-CM | POA: Diagnosis not present

## 2021-10-05 DIAGNOSIS — D485 Neoplasm of uncertain behavior of skin: Secondary | ICD-10-CM | POA: Diagnosis not present

## 2021-10-13 ENCOUNTER — Other Ambulatory Visit: Payer: Self-pay | Admitting: Internal Medicine

## 2021-10-21 ENCOUNTER — Other Ambulatory Visit: Payer: Self-pay | Admitting: Interventional Cardiology

## 2021-10-26 ENCOUNTER — Other Ambulatory Visit: Payer: Self-pay | Admitting: Family Medicine

## 2021-11-02 ENCOUNTER — Other Ambulatory Visit: Payer: Self-pay

## 2021-11-02 NOTE — Telephone Encounter (Signed)
This is Dr. Rosezella [place] pt, Rx need to be resent, because previous Rx was inactivated. Please address

## 2021-11-04 ENCOUNTER — Other Ambulatory Visit: Payer: Self-pay

## 2021-11-04 MED ORDER — ROSUVASTATIN CALCIUM 40 MG PO TABS: 40.0000 mg | ORAL_TABLET | Freq: Every day | ORAL | 3 refills | Status: DC

## 2021-11-04 NOTE — Telephone Encounter (Signed)
This is Dr. Hochrein's pt. °

## 2021-11-14 ENCOUNTER — Other Ambulatory Visit: Payer: Medicare HMO

## 2021-11-15 DIAGNOSIS — I10 Essential (primary) hypertension: Secondary | ICD-10-CM | POA: Diagnosis not present

## 2021-11-15 DIAGNOSIS — R29818 Other symptoms and signs involving the nervous system: Secondary | ICD-10-CM | POA: Diagnosis not present

## 2021-11-15 DIAGNOSIS — G8929 Other chronic pain: Secondary | ICD-10-CM | POA: Diagnosis not present

## 2021-11-15 DIAGNOSIS — E782 Mixed hyperlipidemia: Secondary | ICD-10-CM | POA: Diagnosis not present

## 2021-11-15 DIAGNOSIS — I700 Atherosclerosis of aorta: Secondary | ICD-10-CM | POA: Diagnosis not present

## 2021-11-15 DIAGNOSIS — Z136 Encounter for screening for cardiovascular disorders: Secondary | ICD-10-CM | POA: Diagnosis not present

## 2021-11-15 DIAGNOSIS — Z125 Encounter for screening for malignant neoplasm of prostate: Secondary | ICD-10-CM | POA: Diagnosis not present

## 2021-11-15 DIAGNOSIS — N529 Male erectile dysfunction, unspecified: Secondary | ICD-10-CM | POA: Diagnosis not present

## 2021-11-15 DIAGNOSIS — Z87891 Personal history of nicotine dependence: Secondary | ICD-10-CM | POA: Diagnosis not present

## 2021-11-15 DIAGNOSIS — I25118 Atherosclerotic heart disease of native coronary artery with other forms of angina pectoris: Secondary | ICD-10-CM | POA: Diagnosis not present

## 2021-11-21 ENCOUNTER — Encounter: Payer: Medicare HMO | Admitting: Internal Medicine

## 2021-11-22 DIAGNOSIS — Z136 Encounter for screening for cardiovascular disorders: Secondary | ICD-10-CM | POA: Diagnosis not present

## 2021-11-22 DIAGNOSIS — Z87891 Personal history of nicotine dependence: Secondary | ICD-10-CM | POA: Diagnosis not present

## 2021-11-22 DIAGNOSIS — I700 Atherosclerosis of aorta: Secondary | ICD-10-CM | POA: Diagnosis not present

## 2021-11-23 DIAGNOSIS — N529 Male erectile dysfunction, unspecified: Secondary | ICD-10-CM | POA: Diagnosis not present

## 2021-11-23 DIAGNOSIS — I10 Essential (primary) hypertension: Secondary | ICD-10-CM | POA: Diagnosis not present

## 2021-11-23 DIAGNOSIS — Z833 Family history of diabetes mellitus: Secondary | ICD-10-CM | POA: Diagnosis not present

## 2021-11-23 DIAGNOSIS — I700 Atherosclerosis of aorta: Secondary | ICD-10-CM | POA: Diagnosis not present

## 2021-11-23 DIAGNOSIS — K219 Gastro-esophageal reflux disease without esophagitis: Secondary | ICD-10-CM | POA: Diagnosis not present

## 2021-11-23 DIAGNOSIS — E785 Hyperlipidemia, unspecified: Secondary | ICD-10-CM | POA: Diagnosis not present

## 2021-11-23 DIAGNOSIS — Z8249 Family history of ischemic heart disease and other diseases of the circulatory system: Secondary | ICD-10-CM | POA: Diagnosis not present

## 2021-11-23 DIAGNOSIS — Z87891 Personal history of nicotine dependence: Secondary | ICD-10-CM | POA: Diagnosis not present

## 2021-11-23 DIAGNOSIS — Z7902 Long term (current) use of antithrombotics/antiplatelets: Secondary | ICD-10-CM | POA: Diagnosis not present

## 2021-11-23 DIAGNOSIS — I25119 Atherosclerotic heart disease of native coronary artery with unspecified angina pectoris: Secondary | ICD-10-CM | POA: Diagnosis not present

## 2021-11-23 DIAGNOSIS — G4733 Obstructive sleep apnea (adult) (pediatric): Secondary | ICD-10-CM | POA: Diagnosis not present

## 2021-11-25 ENCOUNTER — Other Ambulatory Visit: Payer: Self-pay

## 2021-11-25 ENCOUNTER — Inpatient Hospital Stay: Payer: Medicare HMO | Attending: Hematology and Oncology

## 2021-11-25 DIAGNOSIS — Z79899 Other long term (current) drug therapy: Secondary | ICD-10-CM | POA: Insufficient documentation

## 2021-11-25 DIAGNOSIS — D472 Monoclonal gammopathy: Secondary | ICD-10-CM | POA: Insufficient documentation

## 2021-11-25 DIAGNOSIS — Z7982 Long term (current) use of aspirin: Secondary | ICD-10-CM | POA: Diagnosis not present

## 2021-11-25 DIAGNOSIS — Z7902 Long term (current) use of antithrombotics/antiplatelets: Secondary | ICD-10-CM | POA: Diagnosis not present

## 2021-11-25 LAB — CBC WITH DIFFERENTIAL (CANCER CENTER ONLY)
Abs Immature Granulocytes: 0.01 10*3/uL (ref 0.00–0.07)
Basophils Absolute: 0 10*3/uL (ref 0.0–0.1)
Basophils Relative: 1 %
Eosinophils Absolute: 0.2 10*3/uL (ref 0.0–0.5)
Eosinophils Relative: 4 %
HCT: 43.2 % (ref 39.0–52.0)
Hemoglobin: 14.6 g/dL (ref 13.0–17.0)
Immature Granulocytes: 0 %
Lymphocytes Relative: 30 %
Lymphs Abs: 1.7 10*3/uL (ref 0.7–4.0)
MCH: 30.2 pg (ref 26.0–34.0)
MCHC: 33.8 g/dL (ref 30.0–36.0)
MCV: 89.3 fL (ref 80.0–100.0)
Monocytes Absolute: 0.5 10*3/uL (ref 0.1–1.0)
Monocytes Relative: 9 %
Neutro Abs: 3.2 10*3/uL (ref 1.7–7.7)
Neutrophils Relative %: 56 %
Platelet Count: 213 10*3/uL (ref 150–400)
RBC: 4.84 MIL/uL (ref 4.22–5.81)
RDW: 12.1 % (ref 11.5–15.5)
WBC Count: 5.7 10*3/uL (ref 4.0–10.5)
nRBC: 0 % (ref 0.0–0.2)

## 2021-11-25 LAB — CMP (CANCER CENTER ONLY)
ALT: 36 U/L (ref 0–44)
AST: 25 U/L (ref 15–41)
Albumin: 4.6 g/dL (ref 3.5–5.0)
Alkaline Phosphatase: 59 U/L (ref 38–126)
Anion gap: 4 — ABNORMAL LOW (ref 5–15)
BUN: 16 mg/dL (ref 8–23)
CO2: 32 mmol/L (ref 22–32)
Calcium: 9.7 mg/dL (ref 8.9–10.3)
Chloride: 105 mmol/L (ref 98–111)
Creatinine: 0.91 mg/dL (ref 0.61–1.24)
GFR, Estimated: >60 mL/min (ref >60)
Glucose, Bld: 114 mg/dL — ABNORMAL HIGH (ref 70–99)
Potassium: 4 mmol/L (ref 3.5–5.1)
Sodium: 141 mmol/L (ref 135–145)
Total Bilirubin: 2.2 mg/dL — ABNORMAL HIGH (ref 0.3–1.2)
Total Protein: 7.4 g/dL (ref 6.5–8.1)

## 2021-11-28 LAB — KAPPA/LAMBDA LIGHT CHAINS
Kappa free light chain: 32.1 mg/L — ABNORMAL HIGH (ref 3.3–19.4)
Kappa, lambda light chain ratio: 2.92 — ABNORMAL HIGH (ref 0.26–1.65)
Lambda free light chains: 11 mg/L (ref 5.7–26.3)

## 2021-11-29 LAB — MULTIPLE MYELOMA PANEL, SERUM
Albumin SerPl Elph-Mcnc: 3.9 g/dL (ref 2.9–4.4)
Albumin/Glob SerPl: 1.4 (ref 0.7–1.7)
Alpha 1: 0.2 g/dL (ref 0.0–0.4)
Alpha2 Glob SerPl Elph-Mcnc: 0.6 g/dL (ref 0.4–1.0)
B-Globulin SerPl Elph-Mcnc: 1.4 g/dL — ABNORMAL HIGH (ref 0.7–1.3)
Gamma Glob SerPl Elph-Mcnc: 0.8 g/dL (ref 0.4–1.8)
Globulin, Total: 3 g/dL (ref 2.2–3.9)
IgA: 655 mg/dL — ABNORMAL HIGH (ref 61–437)
IgG (Immunoglobin G), Serum: 957 mg/dL (ref 603–1613)
IgM (Immunoglobulin M), Srm: 41 mg/dL (ref 20–172)
M Protein SerPl Elph-Mcnc: 0.4 g/dL — ABNORMAL HIGH
Total Protein ELP: 6.9 g/dL (ref 6.0–8.5)

## 2021-11-29 NOTE — Progress Notes (Signed)
Patient Care Team: Princess Perna, MD as PCP - General (Family Medicine) Minus Breeding, MD as Consulting Physician (Cardiology)  DIAGNOSIS: No diagnosis found.  SUMMARY OF ONCOLOGIC HISTORY: Oncology History   No history exists.    CHIEF COMPLIANT:  Follow-up of MGUS   INTERVAL HISTORY: Anthany Plummer is a 71 y.o. with above-mentioned history of MGUS. He presents to the clinic today for a follow-up.      ALLERGIES:  is allergic to lisinopril-hydrochlorothiazide.  MEDICATIONS:  Current Outpatient Medications  Medication Sig Dispense Refill   amLODipine (NORVASC) 5 MG tablet TAKE 1 TABLET BY MOUTH EVERY DAY 90 tablet 0   aspirin EC 81 MG tablet Take 1 tablet (81 mg total) by mouth daily. Swallow whole. 90 tablet 3   clopidogrel (PLAVIX) 75 MG tablet Take 1 tablet (75 mg total) by mouth daily. 90 tablet 3   Iron Combinations (IRON COMPLEX PO) Take 1 capsule by mouth daily.     MAGNESIUM PO Take 1 capsule by mouth daily. magnesium ashwagandha     Melatonin 12 MG TABS Take 12 mg by mouth at bedtime as needed (sleep).     metoprolol succinate (TOPROL-XL) 25 MG 24 hr tablet TAKE 1 TABLET (25 MG TOTAL) BY MOUTH DAILY. 90 tablet 3   Multiple Vitamins-Minerals (MULTIVITAMIN WITH MINERALS) tablet Take 1 tablet by mouth daily. 55+     nitroGLYCERIN (NITROSTAT) 0.4 MG SL tablet Place 1 tablet (0.4 mg total) under the tongue every 5 (five) minutes as needed for chest pain. 100 tablet 3   Omega-3 Fatty Acids ([place] WILD FISH OIL PO) Take 1,000 mg by mouth every other day.     Omega-3 Fatty Acids (OMEGA-3 FISH OIL PO) Take 1 capsule by mouth every other day.     rosuvastatin (CRESTOR) 40 MG tablet Take 1 tablet (40 mg total) by mouth daily. 90 tablet 3   sildenafil (REVATIO) 20 MG tablet Take 10 mg by mouth daily as needed (ED).     triamcinolone cream (KENALOG) 0.1 % Apply 1 application topically 2 (two) times daily as needed. 45 g 1   TURMERIC CURCUMIN PO Take 1 tablet by mouth daily.      No current facility-administered medications for this visit.    PHYSICAL EXAMINATION: ECOG PERFORMANCE STATUS: {CHL ONC ECOG [phone]}  There were no vitals filed for this visit. There were no vitals filed for this visit.  BREAST:*** No palpable masses or nodules in either right or left breasts. No palpable axillary supraclavicular or infraclavicular adenopathy no breast tenderness or nipple discharge. (exam performed in the presence of a chaperone)  LABORATORY DATA:  I have reviewed the data as listed    Latest Ref Rng & Units 11/25/2021   10:00 AM 09/10/2021    9:17 PM 07/19/2021    8:51 AM  CMP  Glucose 70 - 99 mg/dL 114  117  110   BUN 8 - 23 mg/dL '16  17  12   '$ Creatinine 0.61 - 1.24 mg/dL 0.91  0.89  0.81   Sodium 135 - 145 mmol/L 141  139  139   Potassium 3.5 - 5.1 mmol/L 4.0  4.0  4.2   Chloride 98 - 111 mmol/L 105  105  103   CO2 22 - 32 mmol/L 32  24  30   Calcium 8.9 - 10.3 mg/dL 9.7  9.3  9.1   Total Protein 6.5 - 8.1 g/dL 7.4   6.9   Total Bilirubin 0.3 - 1.2 mg/dL 2.2  1.8   Alkaline Phos 38 - 126 U/L 59   50   AST 15 - 41 U/L 25   33   ALT 0 - 44 U/L 36   38     Lab Results  Component Value Date   WBC 5.7 11/25/2021   HGB 14.6 11/25/2021   HCT 43.2 11/25/2021   MCV 89.3 11/25/2021   PLT 213 11/25/2021   NEUTROABS 3.2 11/25/2021    ASSESSMENT & PLAN:  No problem-specific Assessment & Plan notes found for this encounter.    No orders of the defined types were placed in this encounter.  The patient has a good understanding of the overall plan. he agrees with it. he will call with any problems that may develop before the next visit here. Total time spent: 30 mins including face to face time and time spent for planning, charting and co-ordination of care   Suzzette Righter, [place] 11/29/21    I Gardiner Coins am scribing for Dr. Lindi Adie  ***

## 2021-12-01 ENCOUNTER — Inpatient Hospital Stay: Payer: Medicare HMO | Admitting: Hematology and Oncology

## 2021-12-01 VITALS — BP 148/65 | HR 70 | Temp 97.5°F | Resp 18 | Ht 69.0 in | Wt 188.0 lb

## 2021-12-01 DIAGNOSIS — Z7902 Long term (current) use of antithrombotics/antiplatelets: Secondary | ICD-10-CM | POA: Diagnosis not present

## 2021-12-01 DIAGNOSIS — Z7982 Long term (current) use of aspirin: Secondary | ICD-10-CM | POA: Diagnosis not present

## 2021-12-01 DIAGNOSIS — Z79899 Other long term (current) drug therapy: Secondary | ICD-10-CM | POA: Diagnosis not present

## 2021-12-01 DIAGNOSIS — D472 Monoclonal gammopathy: Secondary | ICD-10-CM | POA: Diagnosis not present

## 2021-12-01 NOTE — Assessment & Plan Note (Signed)
IgA kappa monoclonal protein (work-up was performed for neuropathy evaluation) M protein was not measurable No evidence of multiple myeloma.  Creatinine 0.81, calcium 9.3, albumin 4.3, hemoglobin 14.3 Bone survey 05/30/2019: No osseous metastatic disease.   Lab review  11/20/2019: Beta-2 microglobulin 1.5, creatinine 0.84, albumin 4.1, total bilirubin 2.5, hemoglobin 14.3, M protein 0.4 g IgA kappa, kappa 24.5, lambda 11.2, ratio 2.19 11/26/2020: M protein 0.4 g (was 0.4 g) IgA kappa, kappa 27.7, ratio 2.72, beta-2 microglobulin 1.5, creatinine 0.88, calcium 9.2, albumin 4.4, hemoglobin 15.1 11/25/2021: M protein 0.4 g, IgA kappa, kappa 32, lambda 11, ratio 2.92, creatinine 0.91, calcium 9.7, hemoglobin 14.6   I discussed with the patient that there is no any change in the M protein or the other parameters. He is an [employer].  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  He does not enjoy that work but it helps pay his bills.   Recheck labs in 1 year and follow-up after that

## 2021-12-08 DIAGNOSIS — R29818 Other symptoms and signs involving the nervous system: Secondary | ICD-10-CM | POA: Diagnosis not present

## 2021-12-08 DIAGNOSIS — E782 Mixed hyperlipidemia: Secondary | ICD-10-CM | POA: Diagnosis not present

## 2021-12-08 DIAGNOSIS — I10 Essential (primary) hypertension: Secondary | ICD-10-CM | POA: Diagnosis not present

## 2021-12-08 DIAGNOSIS — N529 Male erectile dysfunction, unspecified: Secondary | ICD-10-CM | POA: Diagnosis not present

## 2021-12-08 DIAGNOSIS — Z125 Encounter for screening for malignant neoplasm of prostate: Secondary | ICD-10-CM | POA: Diagnosis not present

## 2021-12-13 DIAGNOSIS — D472 Monoclonal gammopathy: Secondary | ICD-10-CM | POA: Diagnosis not present

## 2021-12-13 DIAGNOSIS — E782 Mixed hyperlipidemia: Secondary | ICD-10-CM | POA: Diagnosis not present

## 2021-12-13 DIAGNOSIS — I10 Essential (primary) hypertension: Secondary | ICD-10-CM | POA: Diagnosis not present

## 2021-12-13 DIAGNOSIS — Z0000 Encounter for general adult medical examination without abnormal findings: Secondary | ICD-10-CM | POA: Diagnosis not present

## 2021-12-13 DIAGNOSIS — I25118 Atherosclerotic heart disease of native coronary artery with other forms of angina pectoris: Secondary | ICD-10-CM | POA: Diagnosis not present

## 2022-01-30 DIAGNOSIS — I25118 Atherosclerotic heart disease of native coronary artery with other forms of angina pectoris: Secondary | ICD-10-CM | POA: Diagnosis not present

## 2022-01-30 DIAGNOSIS — I2584 Coronary atherosclerosis due to calcified coronary lesion: Secondary | ICD-10-CM | POA: Diagnosis not present

## 2022-01-30 DIAGNOSIS — I2510 Atherosclerotic heart disease of native coronary artery without angina pectoris: Secondary | ICD-10-CM | POA: Diagnosis not present

## 2022-01-30 DIAGNOSIS — I10 Essential (primary) hypertension: Secondary | ICD-10-CM | POA: Diagnosis not present

## 2022-01-30 DIAGNOSIS — I700 Atherosclerosis of aorta: Secondary | ICD-10-CM | POA: Diagnosis not present

## 2022-02-14 DIAGNOSIS — H5203 Hypermetropia, bilateral: Secondary | ICD-10-CM | POA: Diagnosis not present

## 2022-02-14 DIAGNOSIS — H04123 Dry eye syndrome of bilateral lacrimal glands: Secondary | ICD-10-CM | POA: Diagnosis not present

## 2022-02-14 DIAGNOSIS — D3132 Benign neoplasm of left choroid: Secondary | ICD-10-CM | POA: Diagnosis not present

## 2022-02-16 ENCOUNTER — Encounter: Payer: Self-pay | Admitting: Internal Medicine

## 2022-02-16 ENCOUNTER — Ambulatory Visit (INDEPENDENT_AMBULATORY_CARE_PROVIDER_SITE_OTHER): Payer: Medicare HMO | Admitting: Internal Medicine

## 2022-02-16 VITALS — BP 138/84 | HR 64 | Temp 98.0°F | Ht 69.0 in | Wt 193.0 lb

## 2022-02-16 DIAGNOSIS — L568 Other specified acute skin changes due to ultraviolet radiation: Secondary | ICD-10-CM

## 2022-02-16 MED ORDER — TRIAMCINOLONE ACETONIDE 0.1 % EX CREA: 1.0000 | TOPICAL_CREAM | Freq: Two times a day (BID) | CUTANEOUS | 1 refills | Status: AC | PRN

## 2022-02-16 NOTE — Progress Notes (Signed)
Subjective:    Patient ID: Dakota Branch, male    DOB: [birth year], 72 y.o.   MRN: [id number]  HPI Here due to a rash  Started a week ago First on  forearms--bumps Then face and upper chest Used the triamcinolone--helped the itching some  No recent outdoor work No other exposures No new medications No new skin care products Did eat a lot of sunflower seeds and cashews before this started---but this is not new  Current Outpatient Medications on File Prior to Visit  Medication Sig Dispense Refill   amLODipine (NORVASC) 5 MG tablet Take 5 mg by mouth daily. If Systolic BP goes over 123456.     aspirin EC 81 MG tablet Take 1 tablet (81 mg total) by mouth daily. Swallow whole. 90 tablet 3   Iron Combinations (IRON COMPLEX PO) Take 1 capsule by mouth daily.     MAGNESIUM PO Take 1 capsule by mouth daily. magnesium ashwagandha     Melatonin 12 MG TABS Take 12 mg by mouth at bedtime as needed (sleep).     metoprolol succinate (TOPROL-XL) 25 MG 24 hr tablet TAKE 1 TABLET (25 MG TOTAL) BY MOUTH DAILY. 90 tablet 3   Multiple Vitamins-Minerals (MULTIVITAMIN WITH MINERALS) tablet Take 1 tablet by mouth daily. 55+     Omega-3 Fatty Acids ([place] WILD FISH OIL PO) Take 1,000 mg by mouth every other day.     rosuvastatin (CRESTOR) 20 MG tablet Take 20 mg by mouth daily.     sildenafil (REVATIO) 20 MG tablet Take 10 mg by mouth daily as needed (ED).     triamcinolone cream (KENALOG) 0.1 % Apply 1 application topically 2 (two) times daily as needed. 45 g 1   TURMERIC CURCUMIN PO Take 1 tablet by mouth daily.     nitroGLYCERIN (NITROSTAT) 0.4 MG SL tablet Place 1 tablet (0.4 mg total) under the tongue every 5 (five) minutes as needed for chest pain. 100 tablet 3   TRELEGY ELLIPTA 200-62.5-25 MCG/ACT AEPB Inhale 1 puff into the lungs daily. (Patient not taking: Reported on 02/16/2022)     No current facility-administered medications on file prior to visit.    Allergies  Allergen Reactions    Lisinopril-Hydrochlorothiazide Itching    Past Medical History:  Diagnosis Date   Colon polyps    Coronary artery disease    ED (erectile dysfunction)    GERD (gastroesophageal reflux disease)    Hyperlipidemia    Hypertension    OSA (obstructive sleep apnea)     Past Surgical History:  Procedure Laterality Date   CARDIAC CATHETERIZATION     hepatitis ( prob A)  01/03/1976   LEFT HEART CATH AND CORONARY ANGIOGRAPHY N/A 01/06/2021   Procedure: LEFT HEART CATH AND CORONARY ANGIOGRAPHY;  Surgeon: Belva Crome, MD;  Location: [facility];  Service: Cardiovascular;  Laterality: N/A;    Family History  Problem Relation Age of Onset   Hydrocephalus Mother        shunt   Arthritis Mother    Diabetes Mother    Hypertension Father    Breast cancer Sister    Heart attack Maternal Grandmother    Diabetes Maternal Aunt    Colon cancer Neg Hx    Esophageal cancer Neg Hx    Rectal cancer Neg Hx    Stomach cancer Neg Hx     Social History   Socioeconomic History   Marital status: Married    Spouse name: Not on file  Number of children: 4   Years of education: Not on file   Highest education level: Master's degree (e.g., MA, MS, MEng, MEd, MSW, MBA)  Occupational History   Occupation: owner    Comment: [employer]  Tobacco Use   Smoking status: Never    Passive exposure: Past   Smokeless tobacco: Never  Vaping Use   Vaping Use: Not on file  Substance and Sexual Activity   Alcohol use: Yes    Comment: rare   Drug use: No   Sexual activity: Yes  Other Topics Concern   Not on file  Social History Narrative   Pt is a Teacher, early years/pre.      Has living will    Wife is health care POA---alternate would be children   Would accept resuscitation   Would accept tube feeds   Social Determinants of Health   Financial Resource Strain: Not on file  Food Insecurity: Not on file  Transportation Needs: Not on file  Physical Activity: Not on file  Stress:  Not on file  Social Connections: Not on file  Intimate Partner Violence: Not on file    Review of Systems Not sick No fever    Objective:   Physical Exam Constitutional:      Appearance: Normal appearance.  Skin:    Comments: Non specific small red papules on extensor > flexor forearms More confluent around top of neck but not really around sides or face  Neurological:     Mental Status: He is alert.            Assessment & Plan:

## 2022-02-16 NOTE — Assessment & Plan Note (Signed)
Clear distribution for photosensitivity rash---but no clear etiology or reason Reassured not worrisome and no infection Will refill the triamcinolone cream for the itching

## 2022-03-02 DIAGNOSIS — Z1211 Encounter for screening for malignant neoplasm of colon: Secondary | ICD-10-CM | POA: Diagnosis not present

## 2022-03-02 DIAGNOSIS — K5730 Diverticulosis of large intestine without perforation or abscess without bleeding: Secondary | ICD-10-CM | POA: Diagnosis not present

## 2022-03-03 DIAGNOSIS — D485 Neoplasm of uncertain behavior of skin: Secondary | ICD-10-CM | POA: Diagnosis not present

## 2022-03-22 ENCOUNTER — Other Ambulatory Visit: Payer: Self-pay

## 2022-03-23 DIAGNOSIS — E782 Mixed hyperlipidemia: Secondary | ICD-10-CM | POA: Diagnosis not present

## 2022-03-23 DIAGNOSIS — L608 Other nail disorders: Secondary | ICD-10-CM | POA: Diagnosis not present

## 2022-03-23 DIAGNOSIS — R5383 Other fatigue: Secondary | ICD-10-CM | POA: Diagnosis not present

## 2022-03-23 DIAGNOSIS — M6530 Trigger finger, unspecified finger: Secondary | ICD-10-CM | POA: Diagnosis not present

## 2022-03-23 DIAGNOSIS — E559 Vitamin D deficiency, unspecified: Secondary | ICD-10-CM | POA: Diagnosis not present

## 2022-03-23 DIAGNOSIS — I25118 Atherosclerotic heart disease of native coronary artery with other forms of angina pectoris: Secondary | ICD-10-CM | POA: Diagnosis not present

## 2022-03-23 DIAGNOSIS — Z955 Presence of coronary angioplasty implant and graft: Secondary | ICD-10-CM | POA: Diagnosis not present

## 2022-03-23 DIAGNOSIS — R21 Rash and other nonspecific skin eruption: Secondary | ICD-10-CM | POA: Diagnosis not present

## 2022-03-23 DIAGNOSIS — I700 Atherosclerosis of aorta: Secondary | ICD-10-CM | POA: Diagnosis not present

## 2022-03-23 DIAGNOSIS — B078 Other viral warts: Secondary | ICD-10-CM | POA: Diagnosis not present

## 2022-03-23 DIAGNOSIS — I10 Essential (primary) hypertension: Secondary | ICD-10-CM | POA: Diagnosis not present

## 2022-04-06 ENCOUNTER — Ambulatory Visit (INDEPENDENT_AMBULATORY_CARE_PROVIDER_SITE_OTHER): Payer: Medicare HMO | Admitting: Internal Medicine

## 2022-04-06 ENCOUNTER — Encounter: Payer: Self-pay | Admitting: Internal Medicine

## 2022-04-06 VITALS — BP 118/68 | HR 70 | Temp 97.7°F | Ht 69.0 in | Wt 190.0 lb

## 2022-04-06 DIAGNOSIS — J392 Other diseases of pharynx: Secondary | ICD-10-CM | POA: Insufficient documentation

## 2022-04-06 DIAGNOSIS — B079 Viral wart, unspecified: Secondary | ICD-10-CM | POA: Diagnosis not present

## 2022-04-06 NOTE — Assessment & Plan Note (Signed)
Discussed pros/cons of Rx--he gave verbal consent Liquid nitrogen 20 seconds x 2 Tolerated well Discussed home plan

## 2022-04-06 NOTE — Assessment & Plan Note (Signed)
Could be start of viral infection like wife had but seems more like allergy Discussed tylenol Try loratadine or cetirizine 10mg  daily

## 2022-04-06 NOTE — Progress Notes (Signed)
Subjective:    Patient ID: Dakota Branch, male    DOB: [birth year], 72 y.o.   MRN: [id number]  HPI Here with wife due to respiratory illness---and a wart  Has wart on right 5th finger Nephew (MD) froze it 2 weeks ago Wants it done again Also area on palm just proximal to MCP---painful Not using tools much now  Also with respiratory symptoms for 2 days Itchy throat No fever or chills No rhinorrhea or cough No SOB No prior allergy problems in spring--but some year round symptoms  Current Outpatient Medications on File Prior to Visit  Medication Sig Dispense Refill   amLODipine (NORVASC) 5 MG tablet Take 5 mg by mouth daily. If Systolic BP goes over 123456.     aspirin EC 81 MG tablet Take 1 tablet (81 mg total) by mouth daily. Swallow whole. 90 tablet 3   Iron Combinations (IRON COMPLEX PO) Take 1 capsule by mouth daily.     MAGNESIUM PO Take 1 capsule by mouth daily. magnesium ashwagandha     Melatonin 12 MG TABS Take 12 mg by mouth at bedtime as needed (sleep).     metoprolol succinate (TOPROL-XL) 25 MG 24 hr tablet TAKE 1 TABLET (25 MG TOTAL) BY MOUTH DAILY. 90 tablet 3   Multiple Vitamins-Minerals (MULTIVITAMIN WITH MINERALS) tablet Take 1 tablet by mouth daily. 55+     Omega-3 Fatty Acids ([place] WILD FISH OIL PO) Take 1,000 mg by mouth every other day.     rosuvastatin (CRESTOR) 20 MG tablet Take 20 mg by mouth daily.     sildenafil (REVATIO) 20 MG tablet Take 10 mg by mouth daily as needed (ED).     TRELEGY ELLIPTA 200-62.5-25 MCG/ACT AEPB Inhale 1 puff into the lungs daily.     triamcinolone cream (KENALOG) 0.1 % Apply 1 Application topically 2 (two) times daily as needed. 45 g 1   TURMERIC CURCUMIN PO Take 1 tablet by mouth daily.     nitroGLYCERIN (NITROSTAT) 0.4 MG SL tablet Place 1 tablet (0.4 mg total) under the tongue every 5 (five) minutes as needed for chest pain. 100 tablet 3   No current facility-administered medications on file prior to visit.    Allergies   Allergen Reactions   Lisinopril-Hydrochlorothiazide Itching    Past Medical History:  Diagnosis Date   Colon polyps    Coronary artery disease    ED (erectile dysfunction)    GERD (gastroesophageal reflux disease)    Hyperlipidemia    Hypertension    OSA (obstructive sleep apnea)     Past Surgical History:  Procedure Laterality Date   CARDIAC CATHETERIZATION     hepatitis ( prob A)  01/03/1976   LEFT HEART CATH AND CORONARY ANGIOGRAPHY N/A 01/06/2021   Procedure: LEFT HEART CATH AND CORONARY ANGIOGRAPHY;  Surgeon: Belva Crome, MD;  Location: [facility];  Service: Cardiovascular;  Laterality: N/A;    Family History  Problem Relation Age of Onset   Hydrocephalus Mother        shunt   Arthritis Mother    Diabetes Mother    Hypertension Father    Breast cancer Sister    Heart attack Maternal Grandmother    Diabetes Maternal Aunt    Colon cancer Neg Hx    Esophageal cancer Neg Hx    Rectal cancer Neg Hx    Stomach cancer Neg Hx     Social History   Socioeconomic History   Marital status: Married  Spouse name: Not on file   Number of children: 4   Years of education: Not on file   Highest education level: Master's degree (e.g., MA, MS, MEng, MEd, MSW, MBA)  Occupational History   Occupation: owner    Comment: [employer]  Tobacco Use   Smoking status: Never    Passive exposure: Past   Smokeless tobacco: Never  Vaping Use   Vaping Use: Not on file  Substance and Sexual Activity   Alcohol use: Yes    Comment: rare   Drug use: No   Sexual activity: Yes  Other Topics Concern   Not on file  Social History Narrative   Pt is a Teacher, early years/pre.      Has living will    Wife is health care POA---alternate would be children   Would accept resuscitation   Would accept tube feeds   Social Determinants of Health   Financial Resource Strain: Low Risk  (04/05/2022)   Overall Financial Resource Strain (CARDIA)    Difficulty of Paying Living  Expenses: Not hard at all  Food Insecurity: No Food Insecurity (04/05/2022)   Hunger Vital Sign    Worried About Running Out of Food in the Last Year: Never true    Ran Out of Food in the Last Year: Never true  Transportation Needs: No Transportation Needs (04/05/2022)   PRAPARE - Hydrologist (Medical): No    Lack of Transportation (Non-Medical): No  Physical Activity: Sufficiently Active (04/05/2022)   Exercise Vital Sign    Days of Exercise per Week: 5 days    Minutes of Exercise per Session: 60 min  Stress: No Stress Concern Present (04/05/2022)   [place]    Feeling of Stress : Not at all  Social Connections: Moderately Integrated (04/05/2022)   Social Connection and Isolation Panel [NHANES]    Frequency of Communication with Friends and Family: More than three times a week    Frequency of Social Gatherings with Friends and Family: Once a week    Attends Religious Services: More than 4 times per year    Active Member of [employer] or Organizations: No    Attends Music therapist: Not on file    Marital Status: Married  Human resources officer Violence: Not on file   Review of Systems Legs feel slightly weak No N/V Eating okay    Objective:   Physical Exam Constitutional:      Appearance: Normal appearance.  HENT:     Head:     Comments: No sinus tenderness    Right Ear: Tympanic membrane and ear canal normal.     Left Ear: Tympanic membrane and ear canal normal.     Mouth/Throat:     Pharynx: No oropharyngeal exudate or posterior oropharyngeal erythema.  Pulmonary:     Effort: Pulmonary effort is normal.     Breath sounds: Normal breath sounds. No wheezing or rales.  Musculoskeletal:     Cervical back: Neck supple.  Lymphadenopathy:     Cervical: No cervical adenopathy.  Skin:    Comments: Apparent callous just proximal to right 5th MCP Wart partially treated with central  scab  Neurological:     Mental Status: He is alert.            Assessment & Plan:

## 2022-05-01 ENCOUNTER — Telehealth (INDEPENDENT_AMBULATORY_CARE_PROVIDER_SITE_OTHER): Payer: Medicare HMO | Admitting: Internal Medicine

## 2022-05-01 ENCOUNTER — Encounter: Payer: Self-pay | Admitting: Internal Medicine

## 2022-05-01 DIAGNOSIS — U071 COVID-19: Secondary | ICD-10-CM | POA: Diagnosis not present

## 2022-05-01 MED ORDER — NIRMATRELVIR/RITONAVIR (PAXLOVID)TABLET: 3.0000 | ORAL_TABLET | Freq: Two times a day (BID) | ORAL | 0 refills | Status: AC

## 2022-05-01 NOTE — Progress Notes (Signed)
Subjective:    Patient ID: Dakota Branch, male    DOB: 1950/03/31, 72 y.o.   MRN: [id number]  HPI Video virtual visit due to COVID infection Identification done Reviewed limitations and billing and he gave consent Participants---patient in his home and I am in my office  His wife had COVID next week He started symptoms 4 days ago Mostly sore throat Very little cough No fever, shakes, chills or sweats No SOB  Taking OTC cold and flu Also zyrtec--for allergies  Current Outpatient Medications on File Prior to Visit  Medication Sig Dispense Refill   amLODipine (NORVASC) 5 MG tablet Take 5 mg by mouth daily. If Systolic BP goes over 120.     aspirin EC 81 MG tablet Take 1 tablet (81 mg total) by mouth daily. Swallow whole. 90 tablet 3   Iron Combinations (IRON COMPLEX PO) Take 1 capsule by mouth daily.     MAGNESIUM PO Take 1 capsule by mouth daily. magnesium ashwagandha     Melatonin 12 MG TABS Take 12 mg by mouth at bedtime as needed (sleep).     metoprolol succinate (TOPROL-XL) 25 MG 24 hr tablet TAKE 1 TABLET (25 MG TOTAL) BY MOUTH DAILY. 90 tablet 3   Multiple Vitamins-Minerals (MULTIVITAMIN WITH MINERALS) tablet Take 1 tablet by mouth daily. 55+     Omega-3 Fatty Acids ([place] WILD FISH OIL PO) Take 1,000 mg by mouth every other day.     rosuvastatin (CRESTOR) 20 MG tablet Take 20 mg by mouth daily.     sildenafil (REVATIO) 20 MG tablet Take 10 mg by mouth daily as needed (ED).     TRELEGY ELLIPTA 200-62.5-25 MCG/ACT AEPB Inhale 1 puff into the lungs daily.     triamcinolone cream (KENALOG) 0.1 % Apply 1 Application topically 2 (two) times daily as needed. 45 g 1   TURMERIC CURCUMIN PO Take 1 tablet by mouth daily.     nitroGLYCERIN (NITROSTAT) 0.4 MG SL tablet Place 1 tablet (0.4 mg total) under the tongue every 5 (five) minutes as needed for chest pain. 100 tablet 3   No current facility-administered medications on file prior to visit.    Allergies  Allergen Reactions    Lisinopril-Hydrochlorothiazide Itching    Past Medical History:  Diagnosis Date   Colon polyps    Coronary artery disease    ED (erectile dysfunction)    GERD (gastroesophageal reflux disease)    Hyperlipidemia    Hypertension    OSA (obstructive sleep apnea)     Past Surgical History:  Procedure Laterality Date   CARDIAC CATHETERIZATION     hepatitis ( prob A)  01/03/1976   LEFT HEART CATH AND CORONARY ANGIOGRAPHY N/A 01/06/2021   Procedure: LEFT HEART CATH AND CORONARY ANGIOGRAPHY;  Surgeon: Lyn Records, MD;  Location: [facility];  Service: Cardiovascular;  Laterality: N/A;    Family History  Problem Relation Age of Onset   Hydrocephalus Mother        shunt   Arthritis Mother    Diabetes Mother    Hypertension Father    Breast cancer Sister    Heart attack Maternal Grandmother    Diabetes Maternal Aunt    Colon cancer Neg Hx    Esophageal cancer Neg Hx    Rectal cancer Neg Hx    Stomach cancer Neg Hx     Social History   Socioeconomic History   Marital status: Married    Spouse name: Not on file  Number of children: 4   Years of education: Not on file   Highest education level: Master's degree (e.g., MA, MS, MEng, MEd, MSW, MBA)  Occupational History   Occupation: owner    Comment: [employer]  Tobacco Use   Smoking status: Never    Passive exposure: Past   Smokeless tobacco: Never  Vaping Use   Vaping Use: Not on file  Substance and Sexual Activity   Alcohol use: Yes    Comment: rare   Drug use: No   Sexual activity: Yes  Other Topics Concern   Not on file  Social History Narrative   Pt is a Theme park manager.      Has living will    Wife is health care POA---alternate would be children   Would accept resuscitation   Would accept tube feeds   Social Determinants of Health   Financial Resource Strain: Low Risk  (04/05/2022)   Overall Financial Resource Strain (CARDIA)    Difficulty of Paying Living Expenses: Not hard  at all  Food Insecurity: No Food Insecurity (04/05/2022)   Hunger Vital Sign    Worried About Running Out of Food in the Last Year: Never true    Ran Out of Food in the Last Year: Never true  Transportation Needs: No Transportation Needs (04/05/2022)   PRAPARE - Administrator, Civil Service (Medical): No    Lack of Transportation (Non-Medical): No  Physical Activity: Sufficiently Active (04/05/2022)   Exercise Vital Sign    Days of Exercise per Week: 5 days    Minutes of Exercise per Session: 60 min  Stress: No Stress Concern Present (04/05/2022)   [employer] of Occupational Health - Occupational Stress Questionnaire    Feeling of Stress : Not at all  Social Connections: Moderately Integrated (04/05/2022)   Social Connection and Isolation Panel [NHANES]    Frequency of Communication with Friends and Family: More than three times a week    Frequency of Social Gatherings with Friends and Family: Once a week    Attends Religious Services: More than 4 times per year    Active Member of [employer] or Organizations: No    Attends Engineer, structural: Not on file    Marital Status: Married  Catering manager Violence: Not on file   Review of Systems No loss of smell or taste No N/V No headache  Eating okay Wife is feeling better    Objective:   Physical Exam Constitutional:      Appearance: Normal appearance.  Pulmonary:     Effort: Pulmonary effort is normal. No respiratory distress.  Neurological:     Mental Status: He is alert.            Assessment & Plan:

## 2022-05-01 NOTE — Assessment & Plan Note (Addendum)
Fairly mild but he is concerned about it worsening Would like the paxlovid--stop if any GI side effects Discussed tylenol as well No sidenafil also ER if any sig SOB Okay to go to work later in the week if symptoms gone--with mask

## 2022-05-10 DIAGNOSIS — Z1283 Encounter for screening for malignant neoplasm of skin: Secondary | ICD-10-CM | POA: Diagnosis not present

## 2022-05-10 DIAGNOSIS — D225 Melanocytic nevi of trunk: Secondary | ICD-10-CM | POA: Diagnosis not present

## 2022-05-10 DIAGNOSIS — D485 Neoplasm of uncertain behavior of skin: Secondary | ICD-10-CM | POA: Diagnosis not present

## 2022-05-25 DIAGNOSIS — G4700 Insomnia, unspecified: Secondary | ICD-10-CM | POA: Diagnosis not present

## 2022-05-25 DIAGNOSIS — R5383 Other fatigue: Secondary | ICD-10-CM | POA: Diagnosis not present

## 2022-05-25 DIAGNOSIS — I10 Essential (primary) hypertension: Secondary | ICD-10-CM | POA: Diagnosis not present

## 2022-05-25 DIAGNOSIS — G478 Other sleep disorders: Secondary | ICD-10-CM | POA: Diagnosis not present

## 2022-05-25 DIAGNOSIS — G4733 Obstructive sleep apnea (adult) (pediatric): Secondary | ICD-10-CM | POA: Diagnosis not present

## 2022-06-12 ENCOUNTER — Other Ambulatory Visit: Payer: Self-pay | Admitting: Family Medicine

## 2022-06-29 DIAGNOSIS — R634 Abnormal weight loss: Secondary | ICD-10-CM | POA: Diagnosis not present

## 2022-06-29 DIAGNOSIS — M79644 Pain in right finger(s): Secondary | ICD-10-CM | POA: Diagnosis not present

## 2022-06-29 DIAGNOSIS — N429 Disorder of prostate, unspecified: Secondary | ICD-10-CM | POA: Diagnosis not present

## 2022-06-29 DIAGNOSIS — M65341 Trigger finger, right ring finger: Secondary | ICD-10-CM | POA: Diagnosis not present

## 2022-06-29 DIAGNOSIS — I10 Essential (primary) hypertension: Secondary | ICD-10-CM | POA: Diagnosis not present

## 2022-07-11 DIAGNOSIS — G478 Other sleep disorders: Secondary | ICD-10-CM | POA: Diagnosis not present

## 2022-07-11 DIAGNOSIS — R5383 Other fatigue: Secondary | ICD-10-CM | POA: Diagnosis not present

## 2022-07-11 DIAGNOSIS — G4733 Obstructive sleep apnea (adult) (pediatric): Secondary | ICD-10-CM | POA: Diagnosis not present

## 2022-07-11 DIAGNOSIS — I10 Essential (primary) hypertension: Secondary | ICD-10-CM | POA: Diagnosis not present

## 2022-07-11 DIAGNOSIS — G4700 Insomnia, unspecified: Secondary | ICD-10-CM | POA: Diagnosis not present

## 2022-07-12 DIAGNOSIS — G4733 Obstructive sleep apnea (adult) (pediatric): Secondary | ICD-10-CM | POA: Diagnosis not present

## 2022-07-13 ENCOUNTER — Ambulatory Visit (INDEPENDENT_AMBULATORY_CARE_PROVIDER_SITE_OTHER): Payer: Medicare HMO | Admitting: Internal Medicine

## 2022-07-13 ENCOUNTER — Encounter: Payer: Self-pay | Admitting: Internal Medicine

## 2022-07-13 VITALS — BP 134/70 | HR 67 | Temp 98.1°F | Ht 69.0 in | Wt 179.0 lb

## 2022-07-13 DIAGNOSIS — R1084 Generalized abdominal pain: Secondary | ICD-10-CM | POA: Insufficient documentation

## 2022-07-13 NOTE — Progress Notes (Signed)
Subjective:    Patient ID: Dakota Branch, male    DOB: 24-Feb-1950, 72 y.o.   MRN: [id number]  HPI Here due to unintended weight loss  Did see nephew MD--noted that his weight was down Feels okay Does have some vague abdominal discomfort---a few weeks ago May have been after eating Bowels move regularly ---no urgency No blood  No N/V This is improving Some gas  Drinks lactose free milk---stopped with the discomfort No sugar free drinks or candies  Current Outpatient Medications on File Prior to Visit  Medication Sig Dispense Refill   amLODipine (NORVASC) 5 MG tablet TAKE 1 TABLET BY MOUTH EVERY DAY 90 tablet 3   aspirin EC 81 MG tablet Take 1 tablet (81 mg total) by mouth daily. Swallow whole. 90 tablet 3   Iron Combinations (IRON COMPLEX PO) Take 1 capsule by mouth daily.     MAGNESIUM PO Take 1 capsule by mouth daily. magnesium ashwagandha     Melatonin 12 MG TABS Take 12 mg by mouth at bedtime as needed (sleep).     metoprolol succinate (TOPROL-XL) 25 MG 24 hr tablet TAKE 1 TABLET (25 MG TOTAL) BY MOUTH DAILY. 90 tablet 3   Multiple Vitamins-Minerals (MULTIVITAMIN WITH MINERALS) tablet Take 1 tablet by mouth daily. 55+     Omega-3 Fatty Acids ([place] WILD FISH OIL PO) Take 1,000 mg by mouth every other day.     rosuvastatin (CRESTOR) 20 MG tablet Take 20 mg by mouth daily.     sildenafil (REVATIO) 20 MG tablet Take 10 mg by mouth daily as needed (ED).     TRELEGY ELLIPTA 200-62.5-25 MCG/ACT AEPB Inhale 1 puff into the lungs daily.     triamcinolone cream (KENALOG) 0.1 % Apply 1 Application topically 2 (two) times daily as needed. 45 g 1   TURMERIC CURCUMIN PO Take 1 tablet by mouth daily.     nitroGLYCERIN (NITROSTAT) 0.4 MG SL tablet Place 1 tablet (0.4 mg total) under the tongue every 5 (five) minutes as needed for chest pain. 100 tablet 3   No current facility-administered medications on file prior to visit.    Allergies  Allergen Reactions    Lisinopril-Hydrochlorothiazide Itching    Past Medical History:  Diagnosis Date   Colon polyps    Coronary artery disease    ED (erectile dysfunction)    GERD (gastroesophageal reflux disease)    Hyperlipidemia    Hypertension    OSA (obstructive sleep apnea)     Past Surgical History:  Procedure Laterality Date   CARDIAC CATHETERIZATION     hepatitis ( prob A)  01/03/1976   LEFT HEART CATH AND CORONARY ANGIOGRAPHY N/A 01/06/2021   Procedure: LEFT HEART CATH AND CORONARY ANGIOGRAPHY;  Surgeon: Lyn Records, MD;  Location: [facility];  Service: Cardiovascular;  Laterality: N/A;    Family History  Problem Relation Age of Onset   Hydrocephalus Mother        shunt   Arthritis Mother    Diabetes Mother    Hypertension Father    Breast cancer Sister    Heart attack Maternal Grandmother    Diabetes Maternal Aunt    Colon cancer Neg Hx    Esophageal cancer Neg Hx    Rectal cancer Neg Hx    Stomach cancer Neg Hx     Social History   Socioeconomic History   Marital status: Married    Spouse name: Not on file   Number of children: 4  Years of education: Not on file   Highest education level: Master's degree (e.g., MA, MS, MEng, MEd, MSW, MBA)  Occupational History   Occupation: owner    Comment: [employer]  Tobacco Use   Smoking status: Never    Passive exposure: Past   Smokeless tobacco: Never  Vaping Use   Vaping status: Not on file  Substance and Sexual Activity   Alcohol use: Yes    Comment: rare   Drug use: No   Sexual activity: Yes  Other Topics Concern   Not on file  Social History Narrative   Pt is a Theme park manager.      Has living will    Wife is health care POA---alternate would be children   Would accept resuscitation   Would accept tube feeds   Social Determinants of Health   Financial Resource Strain: Low Risk  (05/25/2022)   Received from [facility], [facility]   Overall Financial Resource Strain (CARDIA)     Difficulty of Paying Living Expenses: Not hard at all  Food Insecurity: No Food Insecurity (05/25/2022)   Received from [facility], [facility]   Hunger Vital Sign    Worried About Running Out of Food in the Last Year: Never true    Ran Out of Food in the Last Year: Never true  Transportation Needs: No Transportation Needs (05/25/2022)   Received from [facility], [facility]   [facility] - Transportation    Lack of Transportation (Medical): No    Lack of Transportation (Non-Medical): No  Physical Activity: Sufficiently Active (05/25/2022)   Received from [facility], [facility]   Exercise Vital Sign    Days of Exercise per Week: 4 days    Minutes of Exercise per Session: 60 min  Stress: No Stress Concern Present (05/25/2022)   Received from [facility], [facility] - Occupational Stress Questionnaire    Feeling of Stress : Not at all  Social Connections: Socially Integrated (05/25/2022)   Received from [facility], [facility]   Social Network    How would you rate your social network (family, work, friends)?: Good participation with social networks  Intimate Partner Violence: Not At Risk (05/25/2022)   Received from [facility], [facility]   HITS    Over the last 12 months how often did your partner physically hurt you?: 1    Over the last 12 months how often did your partner insult you or talk down to you?: 1    Over the last 12 months how often did your partner threaten you with physical harm?: 1    Over the last 12 months how often did your partner scream or curse at you?: 1   Review of Systems No fever Sleeping okay Same stress at his stores    Objective:   Physical Exam Constitutional:      Appearance: Normal appearance.  Cardiovascular:     Rate and Rhythm: Normal rate and regular rhythm.     Heart sounds: No murmur heard.    No gallop.  Pulmonary:     Effort: Pulmonary effort is normal.     Breath  sounds: Normal breath sounds. No wheezing or rales.  Abdominal:     Palpations: Abdomen is soft. There is no mass.     Tenderness: There is no abdominal tenderness. There is no guarding or rebound.  Musculoskeletal:     Cervical back: Neck supple.     Right lower  leg: No edema.     Left lower leg: No edema.  Lymphadenopathy:     Cervical: No cervical adenopathy.     Upper Body:     Right upper body: No axillary adenopathy.     Left upper body: No axillary adenopathy.     Lower Body: No right inguinal adenopathy. No left inguinal adenopathy.  Neurological:     Mental Status: He is alert.  Psychiatric:        Mood and Affect: Mood normal.        Behavior: Behavior normal.            Assessment & Plan:

## 2022-07-13 NOTE — Assessment & Plan Note (Signed)
Vague and seems to have calmed down some---cut out the lactose free milk Has lost 10-12# but now back to prior weight from 2 years ago Recent labs (last month) reassuring Will just observe Consider imaging if increased abdominal symptoms

## 2022-09-21 DIAGNOSIS — R634 Abnormal weight loss: Secondary | ICD-10-CM | POA: Diagnosis not present

## 2022-09-21 DIAGNOSIS — K2100 Gastro-esophageal reflux disease with esophagitis, without bleeding: Secondary | ICD-10-CM | POA: Diagnosis not present

## 2022-09-21 DIAGNOSIS — E782 Mixed hyperlipidemia: Secondary | ICD-10-CM | POA: Diagnosis not present

## 2022-09-21 DIAGNOSIS — I10 Essential (primary) hypertension: Secondary | ICD-10-CM | POA: Diagnosis not present

## 2022-09-21 DIAGNOSIS — R0602 Shortness of breath: Secondary | ICD-10-CM | POA: Diagnosis not present

## 2022-09-21 DIAGNOSIS — G4733 Obstructive sleep apnea (adult) (pediatric): Secondary | ICD-10-CM | POA: Diagnosis not present

## 2022-09-21 DIAGNOSIS — R1084 Generalized abdominal pain: Secondary | ICD-10-CM | POA: Diagnosis not present

## 2022-09-27 DIAGNOSIS — R634 Abnormal weight loss: Secondary | ICD-10-CM | POA: Diagnosis not present

## 2022-09-27 DIAGNOSIS — R140 Abdominal distension (gaseous): Secondary | ICD-10-CM | POA: Diagnosis not present

## 2022-09-27 DIAGNOSIS — K2100 Gastro-esophageal reflux disease with esophagitis, without bleeding: Secondary | ICD-10-CM | POA: Diagnosis not present

## 2022-09-27 DIAGNOSIS — R0600 Dyspnea, unspecified: Secondary | ICD-10-CM | POA: Diagnosis not present

## 2022-09-27 DIAGNOSIS — Z9109 Other allergy status, other than to drugs and biological substances: Secondary | ICD-10-CM | POA: Diagnosis not present

## 2022-09-27 DIAGNOSIS — K5229 Other allergic and dietetic gastroenteritis and colitis: Secondary | ICD-10-CM | POA: Diagnosis not present

## 2022-09-27 DIAGNOSIS — Z125 Encounter for screening for malignant neoplasm of prostate: Secondary | ICD-10-CM | POA: Diagnosis not present

## 2022-09-27 DIAGNOSIS — I10 Essential (primary) hypertension: Secondary | ICD-10-CM | POA: Diagnosis not present

## 2022-09-27 DIAGNOSIS — E782 Mixed hyperlipidemia: Secondary | ICD-10-CM | POA: Diagnosis not present

## 2022-10-03 DIAGNOSIS — G4733 Obstructive sleep apnea (adult) (pediatric): Secondary | ICD-10-CM | POA: Diagnosis not present

## 2022-10-03 DIAGNOSIS — I10 Essential (primary) hypertension: Secondary | ICD-10-CM | POA: Diagnosis not present

## 2022-10-11 DIAGNOSIS — G4733 Obstructive sleep apnea (adult) (pediatric): Secondary | ICD-10-CM | POA: Diagnosis not present

## 2022-10-25 DIAGNOSIS — Z1283 Encounter for screening for malignant neoplasm of skin: Secondary | ICD-10-CM | POA: Diagnosis not present

## 2022-10-25 DIAGNOSIS — L821 Other seborrheic keratosis: Secondary | ICD-10-CM | POA: Diagnosis not present

## 2022-10-25 DIAGNOSIS — D225 Melanocytic nevi of trunk: Secondary | ICD-10-CM | POA: Diagnosis not present

## 2022-10-25 DIAGNOSIS — L814 Other melanin hyperpigmentation: Secondary | ICD-10-CM | POA: Diagnosis not present

## 2022-10-25 DIAGNOSIS — D485 Neoplasm of uncertain behavior of skin: Secondary | ICD-10-CM | POA: Diagnosis not present

## 2022-10-25 DIAGNOSIS — L820 Inflamed seborrheic keratosis: Secondary | ICD-10-CM | POA: Diagnosis not present

## 2022-11-06 DIAGNOSIS — G4733 Obstructive sleep apnea (adult) (pediatric): Secondary | ICD-10-CM | POA: Diagnosis not present

## 2022-11-09 DIAGNOSIS — G4733 Obstructive sleep apnea (adult) (pediatric): Secondary | ICD-10-CM | POA: Diagnosis not present

## 2022-11-11 DIAGNOSIS — G4733 Obstructive sleep apnea (adult) (pediatric): Secondary | ICD-10-CM | POA: Diagnosis not present

## 2022-11-27 DIAGNOSIS — M19211 Secondary osteoarthritis, right shoulder: Secondary | ICD-10-CM | POA: Diagnosis not present

## 2022-11-28 ENCOUNTER — Inpatient Hospital Stay: Payer: Medicare HMO | Attending: Hematology and Oncology

## 2022-11-28 DIAGNOSIS — R1010 Upper abdominal pain, unspecified: Secondary | ICD-10-CM | POA: Diagnosis not present

## 2022-11-28 DIAGNOSIS — D472 Monoclonal gammopathy: Secondary | ICD-10-CM | POA: Diagnosis not present

## 2022-11-28 DIAGNOSIS — I10 Essential (primary) hypertension: Secondary | ICD-10-CM | POA: Diagnosis not present

## 2022-11-28 DIAGNOSIS — R17 Unspecified jaundice: Secondary | ICD-10-CM | POA: Diagnosis not present

## 2022-11-28 DIAGNOSIS — R1013 Epigastric pain: Secondary | ICD-10-CM | POA: Diagnosis not present

## 2022-11-28 LAB — CBC WITH DIFFERENTIAL (CANCER CENTER ONLY)
Abs Immature Granulocytes: 0.02 10*3/uL (ref 0.00–0.07)
Basophils Absolute: 0 10*3/uL (ref 0.0–0.1)
Basophils Relative: 1 %
Eosinophils Absolute: 0.2 10*3/uL (ref 0.0–0.5)
Eosinophils Relative: 4 %
HCT: 40.8 % (ref 39.0–52.0)
Hemoglobin: 13.9 g/dL (ref 13.0–17.0)
Immature Granulocytes: 0 %
Lymphocytes Relative: 28 %
Lymphs Abs: 1.4 10*3/uL (ref 0.7–4.0)
MCH: 30.6 pg (ref 26.0–34.0)
MCHC: 34.1 g/dL (ref 30.0–36.0)
MCV: 89.9 fL (ref 80.0–100.0)
Monocytes Absolute: 0.5 10*3/uL (ref 0.1–1.0)
Monocytes Relative: 9 %
Neutro Abs: 3 10*3/uL (ref 1.7–7.7)
Neutrophils Relative %: 58 %
Platelet Count: 214 10*3/uL (ref 150–400)
RBC: 4.54 MIL/uL (ref 4.22–5.81)
RDW: 11.9 % (ref 11.5–15.5)
WBC Count: 5.2 10*3/uL (ref 4.0–10.5)
nRBC: 0 % (ref 0.0–0.2)

## 2022-11-28 LAB — CMP (CANCER CENTER ONLY)
ALT: 25 U/L (ref 0–44)
AST: 19 U/L (ref 15–41)
Albumin: 4.2 g/dL (ref 3.5–5.0)
Alkaline Phosphatase: 59 U/L (ref 38–126)
Anion gap: 3 — ABNORMAL LOW (ref 5–15)
BUN: 13 mg/dL (ref 8–23)
CO2: 31 mmol/L (ref 22–32)
Calcium: 9.2 mg/dL (ref 8.9–10.3)
Chloride: 105 mmol/L (ref 98–111)
Creatinine: 0.87 mg/dL (ref 0.61–1.24)
GFR, Estimated: >60 mL/min (ref >60)
Glucose, Bld: 115 mg/dL — ABNORMAL HIGH (ref 70–99)
Potassium: 4.3 mmol/L (ref 3.5–5.1)
Sodium: 139 mmol/L (ref 135–145)
Total Bilirubin: 2.2 mg/dL — ABNORMAL HIGH (ref <1.2)
Total Protein: 7.1 g/dL (ref 6.5–8.1)

## 2022-11-29 LAB — BETA 2 MICROGLOBULIN, SERUM: Beta-2 Microglobulin: 1.5 mg/L (ref 0.6–2.4)

## 2022-11-29 LAB — KAPPA/LAMBDA LIGHT CHAINS
Kappa free light chain: 24.2 mg/L — ABNORMAL HIGH (ref 3.3–19.4)
Kappa, lambda light chain ratio: 2.2 — ABNORMAL HIGH (ref 0.26–1.65)
Lambda free light chains: 11 mg/L (ref 5.7–26.3)

## 2022-12-03 LAB — MULTIPLE MYELOMA PANEL, SERUM
Albumin SerPl Elph-Mcnc: 4 g/dL (ref 2.9–4.4)
Albumin/Glob SerPl: 1.5 (ref 0.7–1.7)
Alpha 1: 0.2 g/dL (ref 0.0–0.4)
Alpha2 Glob SerPl Elph-Mcnc: 0.5 g/dL (ref 0.4–1.0)
B-Globulin SerPl Elph-Mcnc: 1.3 g/dL (ref 0.7–1.3)
Gamma Glob SerPl Elph-Mcnc: 0.8 g/dL (ref 0.4–1.8)
Globulin, Total: 2.8 g/dL (ref 2.2–3.9)
IgA: 673 mg/dL — ABNORMAL HIGH (ref 61–437)
IgG (Immunoglobin G), Serum: 927 mg/dL (ref 603–1613)
IgM (Immunoglobulin M), Srm: 37 mg/dL (ref 20–172)
M Protein SerPl Elph-Mcnc: 0.4 g/dL — ABNORMAL HIGH
Total Protein ELP: 6.8 g/dL (ref 6.0–8.5)

## 2022-12-04 ENCOUNTER — Inpatient Hospital Stay: Payer: Medicare HMO | Attending: Hematology and Oncology | Admitting: Hematology and Oncology

## 2022-12-04 VITALS — BP 132/53 | HR 65 | Temp 97.9°F | Resp 18 | Ht 69.0 in | Wt 184.6 lb

## 2022-12-04 DIAGNOSIS — Z79899 Other long term (current) drug therapy: Secondary | ICD-10-CM | POA: Insufficient documentation

## 2022-12-04 DIAGNOSIS — D472 Monoclonal gammopathy: Secondary | ICD-10-CM | POA: Insufficient documentation

## 2022-12-04 DIAGNOSIS — Z7982 Long term (current) use of aspirin: Secondary | ICD-10-CM | POA: Diagnosis not present

## 2022-12-04 NOTE — Assessment & Plan Note (Addendum)
IgA kappa monoclonal protein (work-up was performed for neuropathy evaluation) No evidence of multiple myeloma. Bone survey 05/30/2019: No osseous metastatic disease.   Lab review  11/20/2019: Beta-2 microglobulin 1.5, creatinine 0.84, albumin 4.1, total bilirubin 2.5, hemoglobin 14.3, M protein 0.4 g IgA kappa, kappa 24.5, lambda 11.2, ratio 2.19 11/26/2020: M protein 0.4 g (was 0.4 g) IgA kappa, kappa 27.7, ratio 2.72, beta-2 microglobulin 1.5, creatinine 0.88, calcium 9.2, albumin 4.4, hemoglobin 15.1 11/25/2021: M protein 0.4 g, IgA kappa, kappa 32, lambda 11, ratio 2.92, creatinine 0.91, calcium 9.7, hemoglobin 14.6 11/28/2022: M protein: 0.4 g IgA kappa, kappa: 24.2, ratio 2.2, creatinine 0.87, calcium 9.2, hemoglobin 13.9, beta-2 microglobulin 1.5   I discussed with the patient that there is no any change in the M protein or the other parameters. 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  He does not enjoy that work but it helps pay his bills.   Recheck labs in 1 year and follow-up after that

## 2022-12-04 NOTE — Progress Notes (Signed)
Patient Care Team: Karie Schwalbe, MD as PCP - General (Internal Medicine) Rollene Rotunda, MD as Consulting Physician (Cardiology)  DIAGNOSIS:  Encounter Diagnosis  Name Primary?   MGUS (monoclonal gammopathy of unknown significance) Yes      CHIEF COMPLIANT: Follow-up of MGUS  HISTORY OF PRESENT ILLNESS:   History of Present Illness   The patient, with a known history of MGUS, presents for his annual follow-up. He reports no new symptoms or changes in his health over the past year. The MGUS was discovered incidentally and the patient has been under annual surveillance since the diagnosis. The patient's lifestyle appears active, with regular travel and outdoor activities. He recently purchased a camper and has been traveling extensively, including a trip to [place]. The patient reports no changes in stress levels and overall feels well.         ALLERGIES:  is allergic to lisinopril-hydrochlorothiazide.  MEDICATIONS:  Current Outpatient Medications  Medication Sig Dispense Refill   amLODipine (NORVASC) 5 MG tablet TAKE 1 TABLET BY MOUTH EVERY DAY 90 tablet 3   aspirin EC 81 MG tablet Take 1 tablet (81 mg total) by mouth daily. Swallow whole. 90 tablet 3   Iron Combinations (IRON COMPLEX PO) Take 1 capsule by mouth daily.     MAGNESIUM PO Take 1 capsule by mouth daily. magnesium ashwagandha     Melatonin 12 MG TABS Take 12 mg by mouth at bedtime as needed (sleep).     metoprolol succinate (TOPROL-XL) 25 MG 24 hr tablet TAKE 1 TABLET (25 MG TOTAL) BY MOUTH DAILY. 90 tablet 3   Multiple Vitamins-Minerals (MULTIVITAMIN WITH MINERALS) tablet Take 1 tablet by mouth daily. 55+     nitroGLYCERIN (NITROSTAT) 0.4 MG SL tablet Place 1 tablet (0.4 mg total) under the tongue every 5 (five) minutes as needed for chest pain. 100 tablet 3   Omega-3 Fatty Acids ([place] WILD FISH OIL PO) Take 1,000 mg by mouth every other day.     rosuvastatin (CRESTOR) 20 MG tablet Take 20 mg by mouth daily.      sildenafil (REVATIO) 20 MG tablet Take 10 mg by mouth daily as needed (ED).     TRELEGY ELLIPTA 200-62.5-25 MCG/ACT AEPB Inhale 1 puff into the lungs daily.     triamcinolone cream (KENALOG) 0.1 % Apply 1 Application topically 2 (two) times daily as needed. 45 g 1   TURMERIC CURCUMIN PO Take 1 tablet by mouth daily.     No current facility-administered medications for this visit.    PHYSICAL EXAMINATION: ECOG PERFORMANCE STATUS: 1 - Symptomatic but completely ambulatory  Vitals:   12/04/22 1021  BP: (!) 132/53  Pulse: 65  Resp: 18  Temp: 97.9 F (36.6 C)  SpO2: 100%   Filed Weights   12/04/22 1021  Weight: 184 lb 9.6 oz (83.7 kg)     LABORATORY DATA:  I have reviewed the data as listed    Latest Ref Rng & Units 11/28/2022   10:08 AM 11/25/2021   10:00 AM 09/10/2021    9:17 PM  CMP  Glucose 70 - 99 mg/dL 284  132  440   BUN 8 - 23 mg/dL 13  16  17    Creatinine 0.61 - 1.24 mg/dL 1.02  7.25  3.66   Sodium 135 - 145 mmol/L 139  141  139   Potassium 3.5 - 5.1 mmol/L 4.3  4.0  4.0   Chloride 98 - 111 mmol/L 105  105  105  CO2 22 - 32 mmol/L 31  32  24   Calcium 8.9 - 10.3 mg/dL 9.2  9.7  9.3   Total Protein 6.5 - 8.1 g/dL 7.1  7.4    Total Bilirubin <1.2 mg/dL 2.2  2.2    Alkaline Phos 38 - 126 U/L 59  59    AST 15 - 41 U/L 19  25    ALT 0 - 44 U/L 25  36      Lab Results  Component Value Date   WBC 5.2 11/28/2022   HGB 13.9 11/28/2022   HCT 40.8 11/28/2022   MCV 89.9 11/28/2022   PLT 214 11/28/2022   NEUTROABS 3.0 11/28/2022    ASSESSMENT & PLAN:  MGUS (monoclonal gammopathy of unknown significance) IgA kappa monoclonal protein (work-up was performed for neuropathy evaluation) No evidence of multiple myeloma. Bone survey 05/30/2019: No osseous metastatic disease.   Lab review  11/20/2019: Beta-2 microglobulin 1.5, creatinine 0.84, albumin 4.1, total bilirubin 2.5, hemoglobin 14.3, M protein 0.4 g IgA kappa, kappa 24.5, lambda 11.2, ratio 2.19 11/26/2020:  M protein 0.4 g (was 0.4 g) IgA kappa, kappa 27.7, ratio 2.72, beta-2 microglobulin 1.5, creatinine 0.88, calcium 9.2, albumin 4.4, hemoglobin 15.1 11/25/2021: M protein 0.4 g, IgA kappa, kappa 32, lambda 11, ratio 2.92, creatinine 0.91, calcium 9.7, hemoglobin 14.6 11/28/2022: M protein: 0.4 g IgA kappa, kappa: 24.2, ratio 2.2, creatinine 0.87, calcium 9.2, hemoglobin 13.9, beta-2 microglobulin 1.5   I discussed with the patient that there is no any change in the M protein or the other parameters. 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  He does not enjoy that work but it helps pay his bills.   They bought a camper and are looking forward to traveling extensively.  They are even been to [place]. Recheck labs in 1 year and follow-up after that     Orders Placed This Encounter  Procedures   CBC with Differential (Cancer Center Only)    Standing Status:   Future    Standing Expiration Date:   12/04/2023   CMP (Cancer Center only)    Standing Status:   Future    Standing Expiration Date:   12/04/2023   Multiple Myeloma Panel (SPEP&IFE w/QIG)    Standing Status:   Future    Standing Expiration Date:   12/04/2023   Kappa/lambda light chains    Standing Status:   Future    Standing Expiration Date:   12/04/2023   Beta 2 microglobulin, serum    Standing Status:   Future    Standing Expiration Date:   12/04/2023   The patient has a good understanding of the overall plan. he agrees with it. he will call with any problems that may develop before the next visit here. Total time spent: 30 mins including face to face time and time spent for planning, charting and co-ordination of care   Tamsen Meek, MD 12/04/22

## 2022-12-06 DIAGNOSIS — R1013 Epigastric pain: Secondary | ICD-10-CM | POA: Diagnosis not present

## 2022-12-08 DIAGNOSIS — K769 Liver disease, unspecified: Secondary | ICD-10-CM | POA: Diagnosis not present

## 2022-12-08 DIAGNOSIS — R1084 Generalized abdominal pain: Secondary | ICD-10-CM | POA: Diagnosis not present

## 2022-12-08 DIAGNOSIS — R634 Abnormal weight loss: Secondary | ICD-10-CM | POA: Diagnosis not present

## 2022-12-08 DIAGNOSIS — R918 Other nonspecific abnormal finding of lung field: Secondary | ICD-10-CM | POA: Diagnosis not present

## 2022-12-08 DIAGNOSIS — K5730 Diverticulosis of large intestine without perforation or abscess without bleeding: Secondary | ICD-10-CM | POA: Diagnosis not present

## 2022-12-08 DIAGNOSIS — J8410 Pulmonary fibrosis, unspecified: Secondary | ICD-10-CM | POA: Diagnosis not present

## 2022-12-08 DIAGNOSIS — N200 Calculus of kidney: Secondary | ICD-10-CM | POA: Diagnosis not present

## 2022-12-08 DIAGNOSIS — I700 Atherosclerosis of aorta: Secondary | ICD-10-CM | POA: Diagnosis not present

## 2022-12-08 DIAGNOSIS — E042 Nontoxic multinodular goiter: Secondary | ICD-10-CM | POA: Diagnosis not present

## 2022-12-08 DIAGNOSIS — I2510 Atherosclerotic heart disease of native coronary artery without angina pectoris: Secondary | ICD-10-CM | POA: Diagnosis not present

## 2022-12-11 DIAGNOSIS — G4733 Obstructive sleep apnea (adult) (pediatric): Secondary | ICD-10-CM | POA: Diagnosis not present

## 2022-12-12 ENCOUNTER — Other Ambulatory Visit: Payer: Self-pay | Admitting: Internal Medicine

## 2022-12-12 DIAGNOSIS — I10 Essential (primary) hypertension: Secondary | ICD-10-CM | POA: Diagnosis not present

## 2022-12-12 DIAGNOSIS — G4733 Obstructive sleep apnea (adult) (pediatric): Secondary | ICD-10-CM | POA: Diagnosis not present

## 2022-12-13 NOTE — Telephone Encounter (Signed)
Pt pharmacy requesting refill for medication not prescribed by Dr Ronette Deter Please address thank you

## 2022-12-15 ENCOUNTER — Other Ambulatory Visit: Payer: Self-pay

## 2022-12-15 MED ORDER — METOPROLOL SUCCINATE ER 25 MG PO TB24: 25.0000 mg | ORAL_TABLET | Freq: Every day | ORAL | 3 refills | Status: AC

## 2022-12-21 DIAGNOSIS — I10 Essential (primary) hypertension: Secondary | ICD-10-CM | POA: Diagnosis not present

## 2022-12-21 DIAGNOSIS — Z0000 Encounter for general adult medical examination without abnormal findings: Secondary | ICD-10-CM | POA: Diagnosis not present

## 2022-12-21 DIAGNOSIS — R7301 Impaired fasting glucose: Secondary | ICD-10-CM | POA: Diagnosis not present

## 2022-12-21 DIAGNOSIS — E782 Mixed hyperlipidemia: Secondary | ICD-10-CM | POA: Diagnosis not present

## 2022-12-21 DIAGNOSIS — E559 Vitamin D deficiency, unspecified: Secondary | ICD-10-CM | POA: Diagnosis not present

## 2022-12-21 DIAGNOSIS — E042 Nontoxic multinodular goiter: Secondary | ICD-10-CM | POA: Diagnosis not present

## 2022-12-21 DIAGNOSIS — E538 Deficiency of other specified B group vitamins: Secondary | ICD-10-CM | POA: Diagnosis not present

## 2022-12-21 DIAGNOSIS — R600 Localized edema: Secondary | ICD-10-CM | POA: Diagnosis not present

## 2023-01-11 DIAGNOSIS — G4733 Obstructive sleep apnea (adult) (pediatric): Secondary | ICD-10-CM | POA: Diagnosis not present

## 2023-01-16 DIAGNOSIS — E041 Nontoxic single thyroid nodule: Secondary | ICD-10-CM | POA: Diagnosis not present

## 2023-01-16 DIAGNOSIS — E042 Nontoxic multinodular goiter: Secondary | ICD-10-CM | POA: Diagnosis not present

## 2023-02-01 DIAGNOSIS — I10 Essential (primary) hypertension: Secondary | ICD-10-CM | POA: Diagnosis not present

## 2023-02-01 DIAGNOSIS — I2510 Atherosclerotic heart disease of native coronary artery without angina pectoris: Secondary | ICD-10-CM | POA: Diagnosis not present

## 2023-02-10 DIAGNOSIS — G4733 Obstructive sleep apnea (adult) (pediatric): Secondary | ICD-10-CM | POA: Diagnosis not present

## 2023-02-11 DIAGNOSIS — G4733 Obstructive sleep apnea (adult) (pediatric): Secondary | ICD-10-CM | POA: Diagnosis not present

## 2023-03-11 DIAGNOSIS — G4733 Obstructive sleep apnea (adult) (pediatric): Secondary | ICD-10-CM | POA: Diagnosis not present

## 2023-03-16 ENCOUNTER — Other Ambulatory Visit: Payer: Self-pay | Admitting: Internal Medicine

## 2023-03-26 DIAGNOSIS — J302 Other seasonal allergic rhinitis: Secondary | ICD-10-CM | POA: Diagnosis not present

## 2023-03-26 DIAGNOSIS — E782 Mixed hyperlipidemia: Secondary | ICD-10-CM | POA: Diagnosis not present

## 2023-03-26 DIAGNOSIS — G5601 Carpal tunnel syndrome, right upper limb: Secondary | ICD-10-CM | POA: Diagnosis not present

## 2023-03-26 DIAGNOSIS — I2510 Atherosclerotic heart disease of native coronary artery without angina pectoris: Secondary | ICD-10-CM | POA: Diagnosis not present

## 2023-03-26 DIAGNOSIS — M150 Primary generalized (osteo)arthritis: Secondary | ICD-10-CM | POA: Diagnosis not present

## 2023-03-26 DIAGNOSIS — I10 Essential (primary) hypertension: Secondary | ICD-10-CM | POA: Diagnosis not present

## 2023-03-26 DIAGNOSIS — M65341 Trigger finger, right ring finger: Secondary | ICD-10-CM | POA: Diagnosis not present

## 2023-03-26 DIAGNOSIS — R739 Hyperglycemia, unspecified: Secondary | ICD-10-CM | POA: Diagnosis not present

## 2023-03-26 DIAGNOSIS — Z1330 Encounter for screening examination for mental health and behavioral disorders, unspecified: Secondary | ICD-10-CM | POA: Diagnosis not present

## 2023-03-26 DIAGNOSIS — R350 Frequency of micturition: Secondary | ICD-10-CM | POA: Diagnosis not present

## 2023-03-26 DIAGNOSIS — E559 Vitamin D deficiency, unspecified: Secondary | ICD-10-CM | POA: Diagnosis not present

## 2023-03-26 DIAGNOSIS — R29898 Other symptoms and signs involving the musculoskeletal system: Secondary | ICD-10-CM | POA: Diagnosis not present

## 2023-04-11 DIAGNOSIS — G4733 Obstructive sleep apnea (adult) (pediatric): Secondary | ICD-10-CM | POA: Diagnosis not present

## 2023-04-17 ENCOUNTER — Ambulatory Visit

## 2023-05-10 DIAGNOSIS — G4733 Obstructive sleep apnea (adult) (pediatric): Secondary | ICD-10-CM | POA: Diagnosis not present

## 2023-05-11 DIAGNOSIS — G4733 Obstructive sleep apnea (adult) (pediatric): Secondary | ICD-10-CM | POA: Diagnosis not present

## 2023-05-17 DIAGNOSIS — M67922 Unspecified disorder of synovium and tendon, left upper arm: Secondary | ICD-10-CM | POA: Diagnosis not present

## 2023-05-17 DIAGNOSIS — S43492A Other sprain of left shoulder joint, initial encounter: Secondary | ICD-10-CM | POA: Diagnosis not present

## 2023-05-17 DIAGNOSIS — M75112 Incomplete rotator cuff tear or rupture of left shoulder, not specified as traumatic: Secondary | ICD-10-CM | POA: Diagnosis not present

## 2023-05-17 DIAGNOSIS — M7582 Other shoulder lesions, left shoulder: Secondary | ICD-10-CM | POA: Diagnosis not present

## 2023-05-17 DIAGNOSIS — M66822 Spontaneous rupture of other tendons, left upper arm: Secondary | ICD-10-CM | POA: Diagnosis not present

## 2023-05-17 DIAGNOSIS — M19012 Primary osteoarthritis, left shoulder: Secondary | ICD-10-CM | POA: Diagnosis not present

## 2023-05-17 DIAGNOSIS — R600 Localized edema: Secondary | ICD-10-CM | POA: Diagnosis not present

## 2023-05-17 DIAGNOSIS — M75122 Complete rotator cuff tear or rupture of left shoulder, not specified as traumatic: Secondary | ICD-10-CM | POA: Diagnosis not present

## 2023-05-30 DIAGNOSIS — M65311 Trigger thumb, right thumb: Secondary | ICD-10-CM | POA: Diagnosis not present

## 2023-05-30 DIAGNOSIS — M75112 Incomplete rotator cuff tear or rupture of left shoulder, not specified as traumatic: Secondary | ICD-10-CM | POA: Diagnosis not present

## 2023-06-11 DIAGNOSIS — G4733 Obstructive sleep apnea (adult) (pediatric): Secondary | ICD-10-CM | POA: Diagnosis not present

## 2023-06-12 DIAGNOSIS — R109 Unspecified abdominal pain: Secondary | ICD-10-CM | POA: Diagnosis not present

## 2023-06-12 DIAGNOSIS — Z91018 Allergy to other foods: Secondary | ICD-10-CM | POA: Diagnosis not present

## 2023-06-14 DIAGNOSIS — Z0100 Encounter for examination of eyes and vision without abnormal findings: Secondary | ICD-10-CM | POA: Diagnosis not present

## 2023-06-14 DIAGNOSIS — D3132 Benign neoplasm of left choroid: Secondary | ICD-10-CM | POA: Diagnosis not present

## 2023-07-03 DIAGNOSIS — L819 Disorder of pigmentation, unspecified: Secondary | ICD-10-CM | POA: Diagnosis not present

## 2023-07-03 DIAGNOSIS — M65311 Trigger thumb, right thumb: Secondary | ICD-10-CM | POA: Diagnosis not present

## 2023-07-03 DIAGNOSIS — E782 Mixed hyperlipidemia: Secondary | ICD-10-CM | POA: Diagnosis not present

## 2023-07-03 DIAGNOSIS — R7401 Elevation of levels of liver transaminase levels: Secondary | ICD-10-CM | POA: Diagnosis not present

## 2023-07-03 DIAGNOSIS — M150 Primary generalized (osteo)arthritis: Secondary | ICD-10-CM | POA: Diagnosis not present

## 2023-07-03 DIAGNOSIS — M75122 Complete rotator cuff tear or rupture of left shoulder, not specified as traumatic: Secondary | ICD-10-CM | POA: Diagnosis not present

## 2023-07-03 DIAGNOSIS — I10 Essential (primary) hypertension: Secondary | ICD-10-CM | POA: Diagnosis not present

## 2023-07-09 DIAGNOSIS — R053 Chronic cough: Secondary | ICD-10-CM | POA: Diagnosis not present

## 2023-07-09 DIAGNOSIS — R109 Unspecified abdominal pain: Secondary | ICD-10-CM | POA: Diagnosis not present

## 2023-07-09 DIAGNOSIS — Z91018 Allergy to other foods: Secondary | ICD-10-CM | POA: Diagnosis not present

## 2023-07-11 DIAGNOSIS — G4733 Obstructive sleep apnea (adult) (pediatric): Secondary | ICD-10-CM | POA: Diagnosis not present

## 2023-08-11 DIAGNOSIS — G4733 Obstructive sleep apnea (adult) (pediatric): Secondary | ICD-10-CM | POA: Diagnosis not present

## 2023-08-22 DIAGNOSIS — M75112 Incomplete rotator cuff tear or rupture of left shoulder, not specified as traumatic: Secondary | ICD-10-CM | POA: Diagnosis not present

## 2023-09-11 DIAGNOSIS — G4733 Obstructive sleep apnea (adult) (pediatric): Secondary | ICD-10-CM | POA: Diagnosis not present

## 2023-10-05 DIAGNOSIS — I10 Essential (primary) hypertension: Secondary | ICD-10-CM | POA: Diagnosis not present

## 2023-10-09 DIAGNOSIS — M65311 Trigger thumb, right thumb: Secondary | ICD-10-CM | POA: Diagnosis not present

## 2023-10-09 DIAGNOSIS — N401 Enlarged prostate with lower urinary tract symptoms: Secondary | ICD-10-CM | POA: Diagnosis not present

## 2023-10-09 DIAGNOSIS — I10 Essential (primary) hypertension: Secondary | ICD-10-CM | POA: Diagnosis not present

## 2023-10-09 DIAGNOSIS — Z23 Encounter for immunization: Secondary | ICD-10-CM | POA: Diagnosis not present

## 2023-10-09 DIAGNOSIS — R351 Nocturia: Secondary | ICD-10-CM | POA: Diagnosis not present

## 2023-10-09 DIAGNOSIS — M25551 Pain in right hip: Secondary | ICD-10-CM | POA: Diagnosis not present

## 2023-10-11 DIAGNOSIS — E538 Deficiency of other specified B group vitamins: Secondary | ICD-10-CM | POA: Diagnosis not present

## 2023-10-11 DIAGNOSIS — Z131 Encounter for screening for diabetes mellitus: Secondary | ICD-10-CM | POA: Diagnosis not present

## 2023-10-11 DIAGNOSIS — E782 Mixed hyperlipidemia: Secondary | ICD-10-CM | POA: Diagnosis not present

## 2023-10-11 DIAGNOSIS — R5383 Other fatigue: Secondary | ICD-10-CM | POA: Diagnosis not present

## 2023-10-11 DIAGNOSIS — N429 Disorder of prostate, unspecified: Secondary | ICD-10-CM | POA: Diagnosis not present

## 2023-10-11 DIAGNOSIS — E559 Vitamin D deficiency, unspecified: Secondary | ICD-10-CM | POA: Diagnosis not present

## 2023-10-11 DIAGNOSIS — Z0000 Encounter for general adult medical examination without abnormal findings: Secondary | ICD-10-CM | POA: Diagnosis not present

## 2023-10-31 DIAGNOSIS — D225 Melanocytic nevi of trunk: Secondary | ICD-10-CM | POA: Diagnosis not present

## 2023-10-31 DIAGNOSIS — B078 Other viral warts: Secondary | ICD-10-CM | POA: Diagnosis not present

## 2023-10-31 DIAGNOSIS — L821 Other seborrheic keratosis: Secondary | ICD-10-CM | POA: Diagnosis not present

## 2023-10-31 DIAGNOSIS — Z1283 Encounter for screening for malignant neoplasm of skin: Secondary | ICD-10-CM | POA: Diagnosis not present

## 2023-10-31 DIAGNOSIS — L905 Scar conditions and fibrosis of skin: Secondary | ICD-10-CM | POA: Diagnosis not present

## 2023-11-05 DIAGNOSIS — M65331 Trigger finger, right middle finger: Secondary | ICD-10-CM | POA: Diagnosis not present

## 2023-11-05 DIAGNOSIS — M65351 Trigger finger, right little finger: Secondary | ICD-10-CM | POA: Diagnosis not present

## 2023-11-05 DIAGNOSIS — M65341 Trigger finger, right ring finger: Secondary | ICD-10-CM | POA: Diagnosis not present

## 2023-11-05 DIAGNOSIS — I10 Essential (primary) hypertension: Secondary | ICD-10-CM | POA: Diagnosis not present

## 2023-11-05 DIAGNOSIS — M65311 Trigger thumb, right thumb: Secondary | ICD-10-CM | POA: Diagnosis not present

## 2023-11-05 DIAGNOSIS — M65321 Trigger finger, right index finger: Secondary | ICD-10-CM | POA: Diagnosis not present

## 2023-11-14 DIAGNOSIS — M67941 Unspecified disorder of synovium and tendon, right hand: Secondary | ICD-10-CM | POA: Diagnosis not present

## 2023-11-14 DIAGNOSIS — M65321 Trigger finger, right index finger: Secondary | ICD-10-CM | POA: Diagnosis not present

## 2023-11-14 DIAGNOSIS — G4733 Obstructive sleep apnea (adult) (pediatric): Secondary | ICD-10-CM | POA: Diagnosis not present

## 2023-11-14 DIAGNOSIS — M65351 Trigger finger, right little finger: Secondary | ICD-10-CM | POA: Diagnosis not present

## 2023-11-14 DIAGNOSIS — Z7982 Long term (current) use of aspirin: Secondary | ICD-10-CM | POA: Diagnosis not present

## 2023-11-14 DIAGNOSIS — Z87891 Personal history of nicotine dependence: Secondary | ICD-10-CM | POA: Diagnosis not present

## 2023-11-14 DIAGNOSIS — I10 Essential (primary) hypertension: Secondary | ICD-10-CM | POA: Diagnosis not present

## 2023-11-14 DIAGNOSIS — M65341 Trigger finger, right ring finger: Secondary | ICD-10-CM | POA: Diagnosis not present

## 2023-11-14 DIAGNOSIS — M65311 Trigger thumb, right thumb: Secondary | ICD-10-CM | POA: Diagnosis not present

## 2023-11-14 DIAGNOSIS — M65331 Trigger finger, right middle finger: Secondary | ICD-10-CM | POA: Diagnosis not present

## 2023-11-14 DIAGNOSIS — Z79899 Other long term (current) drug therapy: Secondary | ICD-10-CM | POA: Diagnosis not present

## 2023-11-14 DIAGNOSIS — E7800 Pure hypercholesterolemia, unspecified: Secondary | ICD-10-CM | POA: Diagnosis not present

## 2023-11-20 DIAGNOSIS — M65311 Trigger thumb, right thumb: Secondary | ICD-10-CM | POA: Diagnosis not present

## 2023-11-20 DIAGNOSIS — M25641 Stiffness of right hand, not elsewhere classified: Secondary | ICD-10-CM | POA: Diagnosis not present

## 2023-11-27 DIAGNOSIS — M65311 Trigger thumb, right thumb: Secondary | ICD-10-CM | POA: Diagnosis not present

## 2023-11-27 DIAGNOSIS — M25641 Stiffness of right hand, not elsewhere classified: Secondary | ICD-10-CM | POA: Diagnosis not present

## 2023-12-03 ENCOUNTER — Inpatient Hospital Stay: Payer: Medicare HMO | Attending: Hematology and Oncology

## 2023-12-03 DIAGNOSIS — Z7982 Long term (current) use of aspirin: Secondary | ICD-10-CM | POA: Diagnosis not present

## 2023-12-03 DIAGNOSIS — Z79899 Other long term (current) drug therapy: Secondary | ICD-10-CM | POA: Insufficient documentation

## 2023-12-03 DIAGNOSIS — D472 Monoclonal gammopathy: Secondary | ICD-10-CM | POA: Diagnosis present

## 2023-12-03 LAB — CBC WITH DIFFERENTIAL (CANCER CENTER ONLY)
Abs Immature Granulocytes: 0.02 K/uL (ref 0.00–0.07)
Basophils Absolute: 0 K/uL (ref 0.0–0.1)
Basophils Relative: 1 %
Eosinophils Absolute: 0.2 K/uL (ref 0.0–0.5)
Eosinophils Relative: 4 %
HCT: 45.2 % (ref 39.0–52.0)
Hemoglobin: 15 g/dL (ref 13.0–17.0)
Immature Granulocytes: 0 %
Lymphocytes Relative: 26 %
Lymphs Abs: 1.6 K/uL (ref 0.7–4.0)
MCH: 29.5 pg (ref 26.0–34.0)
MCHC: 33.2 g/dL (ref 30.0–36.0)
MCV: 89 fL (ref 80.0–100.0)
Monocytes Absolute: 0.5 K/uL (ref 0.1–1.0)
Monocytes Relative: 9 %
Neutro Abs: 3.5 K/uL (ref 1.7–7.7)
Neutrophils Relative %: 60 %
Platelet Count: 245 K/uL (ref 150–400)
RBC: 5.08 MIL/uL (ref 4.22–5.81)
RDW: 12.7 % (ref 11.5–15.5)
WBC Count: 5.9 K/uL (ref 4.0–10.5)
nRBC: 0 % (ref 0.0–0.2)

## 2023-12-03 LAB — CMP (CANCER CENTER ONLY)
ALT: 41 U/L (ref 0–44)
AST: 32 U/L (ref 15–41)
Albumin: 4.4 g/dL (ref 3.5–5.0)
Alkaline Phosphatase: 71 U/L (ref 38–126)
Anion gap: 10 (ref 5–15)
BUN: 11 mg/dL (ref 8–23)
CO2: 26 mmol/L (ref 22–32)
Calcium: 9.1 mg/dL (ref 8.9–10.3)
Chloride: 103 mmol/L (ref 98–111)
Creatinine: 0.88 mg/dL (ref 0.61–1.24)
GFR, Estimated: >60 mL/min (ref >60)
Glucose, Bld: 111 mg/dL — ABNORMAL HIGH (ref 70–99)
Potassium: 4.4 mmol/L (ref 3.5–5.1)
Sodium: 139 mmol/L (ref 135–145)
Total Bilirubin: 1.9 mg/dL — ABNORMAL HIGH (ref 0.0–1.2)
Total Protein: 7.3 g/dL (ref 6.5–8.1)

## 2023-12-04 LAB — KAPPA/LAMBDA LIGHT CHAINS
Kappa free light chain: 25 mg/L — ABNORMAL HIGH (ref 3.3–19.4)
Kappa, lambda light chain ratio: 2.03 — ABNORMAL HIGH (ref 0.26–1.65)
Lambda free light chains: 12.3 mg/L (ref 5.7–26.3)

## 2023-12-05 LAB — MULTIPLE MYELOMA PANEL, SERUM
Albumin SerPl Elph-Mcnc: 3.8 g/dL (ref 2.9–4.4)
Albumin/Glob SerPl: 1.4 (ref 0.7–1.7)
Alpha 1: 0.2 g/dL (ref 0.0–0.4)
Alpha2 Glob SerPl Elph-Mcnc: 0.6 g/dL (ref 0.4–1.0)
B-Globulin SerPl Elph-Mcnc: 1.4 g/dL — ABNORMAL HIGH (ref 0.7–1.3)
Gamma Glob SerPl Elph-Mcnc: 0.7 g/dL (ref 0.4–1.8)
Globulin, Total: 2.9 g/dL (ref 2.2–3.9)
IgA: 677 mg/dL — ABNORMAL HIGH (ref 61–437)
IgG (Immunoglobin G), Serum: 911 mg/dL (ref 603–1613)
IgM (Immunoglobulin M), Srm: 41 mg/dL (ref 15–143)
M Protein SerPl Elph-Mcnc: 0.6 g/dL — ABNORMAL HIGH
Total Protein ELP: 6.7 g/dL (ref 6.0–8.5)

## 2023-12-05 NOTE — Assessment & Plan Note (Signed)
 IgA kappa monoclonal protein (work-up was performed for neuropathy evaluation) No evidence of multiple myeloma. Bone survey 05/30/2019: No osseous metastatic disease.   Lab review  11/20/2019: Beta-2  microglobulin 1.5, creatinine 0.84, albumin 4.1, total bilirubin 2.5, hemoglobin 14.3, M protein 0.4 g IgA kappa, kappa 24.5, lambda 11.2, ratio 2.19 11/26/2020: M protein 0.4 g (was 0.4 g) IgA kappa, kappa 27.7, ratio 2.72, beta-2  microglobulin 1.5, creatinine 0.88, calcium  9.2, albumin 4.4, hemoglobin 15.1 11/25/2021: M protein 0.4 g, IgA kappa, kappa 32, lambda 11, ratio 2.92, creatinine 0.91, calcium  9.7, hemoglobin 14.6 11/28/2022: M protein: 0.4 g IgA kappa, kappa: 24.2, ratio 2.2, creatinine 0.87, calcium  9.2, hemoglobin 13.9, beta-2  microglobulin 1.5 12/03/2023: M protein: Pending, hemoglobin 15, kappa 25, lambda 12.3, ratio 2, creatinine 0.88, calcium  9.1, albumin 4.4, total bilirubin 1.9   I discussed with the patient that there is no any change in the M protein or the other parameters. 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  He does not enjoy that work but it helps pay his bills.   They bought a camper and are looking forward to traveling extensively.  They are even been to [place]. Recheck labs in 1 year and follow-up after that

## 2023-12-06 DIAGNOSIS — N5201 Erectile dysfunction due to arterial insufficiency: Secondary | ICD-10-CM | POA: Diagnosis not present

## 2023-12-10 ENCOUNTER — Inpatient Hospital Stay: Payer: Medicare HMO | Admitting: Hematology and Oncology

## 2023-12-10 VITALS — BP 144/59 | HR 67 | Temp 97.9°F | Resp 18 | Wt 199.2 lb

## 2023-12-10 DIAGNOSIS — D472 Monoclonal gammopathy: Secondary | ICD-10-CM

## 2023-12-10 NOTE — Progress Notes (Signed)
 Patient Care Team: Jimmy Charlie FERNS, MD as PCP - General (Internal Medicine) Lavona Agent, MD as Consulting Physician (Cardiology)  DIAGNOSIS:  Encounter Diagnosis  Name Primary?   MGUS (monoclonal gammopathy of unknown significance) Yes   CHIEF COMPLIANT: Follow-up of MGUS  HISTORY OF PRESENT ILLNESS:  History of Present Illness Dakota Branch is a 73 year old male with monoclonal gammopathy of undetermined significance (MGUS) who presents for routine follow-up.  He has an M protein that increased from 0.4 to 0.6 over the past year, with stable IgA levels compared to last year. Kappa light chain levels remain elevated at 25 but are slightly lower than two years ago, and the kappa to lambda ratio has improved over time. Kidney function is stable with creatinine within normal range, and serum calcium  is normal.     ALLERGIES:  is allergic to lisinopril-hydrochlorothiazide.  MEDICATIONS:  Current Outpatient Medications  Medication Sig Dispense Refill   amLODipine  (NORVASC ) 5 MG tablet TAKE 1 TABLET BY MOUTH EVERY DAY 90 tablet 3   aspirin  EC 81 MG tablet Take 1 tablet (81 mg total) by mouth daily. Swallow whole. 90 tablet 3   Iron Combinations (IRON COMPLEX PO) Take 1 capsule by mouth daily.     MAGNESIUM PO Take 1 capsule by mouth daily. magnesium ashwagandha     Melatonin 12 MG TABS Take 12 mg by mouth at bedtime as needed (sleep).     metoprolol  succinate (TOPROL -XL) 25 MG 24 hr tablet Take 1 tablet (25 mg total) by mouth daily. 90 tablet 3   Multiple Vitamins-Minerals (MULTIVITAMIN WITH MINERALS) tablet Take 1 tablet by mouth daily. 55+     nitroGLYCERIN  (NITROSTAT ) 0.4 MG SL tablet Place 1 tablet (0.4 mg total) under the tongue every 5 (five) minutes as needed for chest pain. 100 tablet 3   Omega-3 Fatty Acids ([place]  WILD FISH OIL PO) Take 1,000 mg by mouth every other day.     rosuvastatin  (CRESTOR ) 20 MG tablet Take 20 mg by mouth daily.     sildenafil  (REVATIO ) 20  MG tablet Take 10 mg by mouth daily as needed (ED).     TRELEGY ELLIPTA 200-62.5-25 MCG/ACT AEPB Inhale 1 puff into the lungs daily.     triamcinolone  cream (KENALOG ) 0.1 % Apply 1 Application topically 2 (two) times daily as needed. 45 g 1   TURMERIC CURCUMIN PO Take 1 tablet by mouth daily.     No current facility-administered medications for this visit.    PHYSICAL EXAMINATION: ECOG PERFORMANCE STATUS: 1 - Symptomatic but completely ambulatory  There were no vitals filed for this visit. There were no vitals filed for this visit.  Physical Exam   (exam performed in the presence of a chaperone)  LABORATORY DATA:  I have reviewed the data as listed    Latest Ref Rng & Units 12/03/2023    9:50 AM 11/28/2022   10:08 AM 11/25/2021   10:00 AM  CMP  Glucose 70 - 99 mg/dL 888  884  885   BUN 8 - 23 mg/dL 11  13  16    Creatinine 0.61 - 1.24 mg/dL 9.11  9.12  9.08   Sodium 135 - 145 mmol/L 139  139  141   Potassium 3.5 - 5.1 mmol/L 4.4  4.3  4.0   Chloride 98 - 111 mmol/L 103  105  105   CO2 22 - 32 mmol/L 26  31  32   Calcium  8.9 - 10.3 mg/dL 9.1  9.2  9.7   Total Protein 6.5 - 8.1 g/dL 7.3  7.1  7.4   Total Bilirubin 0.0 - 1.2 mg/dL 1.9  2.2  2.2   Alkaline Phos 38 - 126 U/L 71  59  59   AST 15 - 41 U/L 32  19  25   ALT 0 - 44 U/L 41  25  36     Lab Results  Component Value Date   WBC 5.9 12/03/2023   HGB 15.0 12/03/2023   HCT 45.2 12/03/2023   MCV 89.0 12/03/2023   PLT 245 12/03/2023   NEUTROABS 3.5 12/03/2023    ASSESSMENT & PLAN:  MGUS (monoclonal gammopathy of unknown significance) IgA kappa monoclonal protein (work-up was performed for neuropathy evaluation) No evidence of multiple myeloma. Bone survey 05/30/2019: No osseous metastatic disease.   Lab review  11/20/2019: Beta-2  microglobulin 1.5, creatinine 0.84, albumin 4.1, total bilirubin 2.5, hemoglobin 14.3, M protein 0.4 g IgA kappa, kappa 24.5, lambda 11.2, ratio 2.19 11/26/2020: M protein 0.4 g (was 0.4  g) IgA kappa, kappa 27.7, ratio 2.72, beta-2  microglobulin 1.5, creatinine 0.88, calcium  9.2, albumin 4.4, hemoglobin 15.1 11/25/2021: M protein 0.4 g, IgA kappa, kappa 32, lambda 11, ratio 2.92, creatinine 0.91, calcium  9.7, hemoglobin 14.6 11/28/2022: M protein: 0.4 g IgA kappa, kappa: 24.2, ratio 2.2, creatinine 0.87, calcium  9.2, hemoglobin 13.9, beta-2  microglobulin 1.5 12/03/2023: M protein: 0.6 g, hemoglobin 15, kappa 25, lambda 12.3, ratio 2, creatinine 0.88, calcium  9.1, albumin 4.4, total bilirubin 1.9    I discussed with the patient that there has been a slight increase in the M protein.  However it has not caused any change in his creatinine or calcium  or any other endorgan dysfunction.  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  He does not enjoy that work but it helps pay his bills.   They bought a camper and are looking forward to traveling extensively.  They are even been to [place]. Recheck labs in 1 year and follow-up after that No orders of the defined types were placed in this encounter.  The patient has a good understanding of the overall plan. he agrees with it. he will call with any problems that may develop before the next visit here.  I personally spent a total of 30 minutes in the care of the patient today including preparing to see the patient, getting/reviewing separately obtained history, performing a medically appropriate exam/evaluation, counseling and educating, placing orders, referring and communicating with other health care professionals, documenting clinical information in the EHR, independently interpreting results, communicating results, and coordinating care.   Viinay K Sherron Mummert, MD 12/10/23

## 2024-12-02 ENCOUNTER — Inpatient Hospital Stay

## 2024-12-09 ENCOUNTER — Inpatient Hospital Stay

## 2024-12-09 ENCOUNTER — Inpatient Hospital Stay: Admitting: Hematology and Oncology

## 2024-12-16 ENCOUNTER — Inpatient Hospital Stay: Admitting: Hematology and Oncology
